# Patient Record
Sex: Female | Born: 1945 | Race: White | Hispanic: No | Smoking: Former smoker
Health system: Southern US, Community
[De-identification: ages and names within clinical notes are randomized; demographics above are authoritative.]

## PROBLEM LIST (undated history)

## (undated) DIAGNOSIS — J309 Allergic rhinitis, unspecified: Secondary | ICD-10-CM

## (undated) DIAGNOSIS — E785 Hyperlipidemia, unspecified: Secondary | ICD-10-CM

## (undated) DIAGNOSIS — K219 Gastro-esophageal reflux disease without esophagitis: Secondary | ICD-10-CM

## (undated) DIAGNOSIS — C3490 Malignant neoplasm of unspecified part of unspecified bronchus or lung: Secondary | ICD-10-CM

## (undated) DIAGNOSIS — M79609 Pain in unspecified limb: Secondary | ICD-10-CM

## (undated) DIAGNOSIS — K8020 Calculus of gallbladder without cholecystitis without obstruction: Secondary | ICD-10-CM

## (undated) DIAGNOSIS — E079 Disorder of thyroid, unspecified: Secondary | ICD-10-CM

## (undated) DIAGNOSIS — R51 Headache: Secondary | ICD-10-CM

## (undated) DIAGNOSIS — F419 Anxiety disorder, unspecified: Secondary | ICD-10-CM

## (undated) DIAGNOSIS — R1013 Epigastric pain: Secondary | ICD-10-CM

## (undated) DIAGNOSIS — R21 Rash and other nonspecific skin eruption: Secondary | ICD-10-CM

## (undated) DIAGNOSIS — J449 Chronic obstructive pulmonary disease, unspecified: Secondary | ICD-10-CM

## (undated) DIAGNOSIS — M5412 Radiculopathy, cervical region: Secondary | ICD-10-CM

## (undated) DIAGNOSIS — E041 Nontoxic single thyroid nodule: Secondary | ICD-10-CM

## (undated) DIAGNOSIS — F411 Generalized anxiety disorder: Secondary | ICD-10-CM

## (undated) HISTORY — DX: Hyperlipidemia, unspecified: E78.5

## (undated) HISTORY — DX: Anxiety disorder, unspecified: F41.9

## (undated) HISTORY — DX: Generalized anxiety disorder: F41.1

## (undated) HISTORY — DX: Disorder of thyroid, unspecified: E07.9

## (undated) HISTORY — DX: Pain in unspecified limb: M79.609

## (undated) HISTORY — DX: Radiculopathy, cervical region: M54.12

## (undated) HISTORY — DX: Gastro-esophageal reflux disease without esophagitis: K21.9

## (undated) HISTORY — DX: Calculus of gallbladder without cholecystitis without obstruction: K80.20

## (undated) HISTORY — DX: Rash and other nonspecific skin eruption: R21

## (undated) HISTORY — DX: Headache: R51

## (undated) HISTORY — DX: Nontoxic single thyroid nodule: E04.1

## (undated) HISTORY — DX: Allergic rhinitis, unspecified: J30.9

## (undated) HISTORY — DX: Chronic obstructive pulmonary disease, unspecified: J44.9

## (undated) HISTORY — DX: Epigastric pain: R10.13

---

## 1973-09-06 HISTORY — PX: ANAL FISSURE REPAIR: SHX2312

## 1976-09-06 DIAGNOSIS — E041 Nontoxic single thyroid nodule: Secondary | ICD-10-CM

## 1976-09-06 HISTORY — DX: Nontoxic single thyroid nodule: E04.1

## 1976-09-06 HISTORY — PX: TUMOR REMOVAL: SHX12

## 1997-09-06 ENCOUNTER — Encounter: Payer: Self-pay | Admitting: Internal Medicine

## 1997-09-06 LAB — CONVERTED CEMR LAB

## 1997-09-06 LAB — HM MAMMOGRAPHY

## 1999-09-07 HISTORY — PX: OTHER SURGICAL HISTORY: SHX169

## 2000-01-07 ENCOUNTER — Encounter: Payer: Self-pay | Admitting: Internal Medicine

## 2000-01-08 ENCOUNTER — Inpatient Hospital Stay (HOSPITAL_COMMUNITY): Admission: EM | Admit: 2000-01-08 | Discharge: 2000-01-10 | Payer: Self-pay | Admitting: Internal Medicine

## 2000-01-08 ENCOUNTER — Encounter: Payer: Self-pay | Admitting: Orthopedic Surgery

## 2000-02-10 ENCOUNTER — Ambulatory Visit (HOSPITAL_COMMUNITY): Admission: RE | Admit: 2000-02-10 | Discharge: 2000-02-10 | Payer: Self-pay | Admitting: Orthopedic Surgery

## 2000-06-17 ENCOUNTER — Encounter: Payer: Self-pay | Admitting: Family Medicine

## 2000-06-17 ENCOUNTER — Encounter: Admission: RE | Admit: 2000-06-17 | Discharge: 2000-06-17 | Payer: Self-pay | Admitting: Family Medicine

## 2001-02-25 ENCOUNTER — Encounter: Payer: Self-pay | Admitting: Emergency Medicine

## 2001-02-25 ENCOUNTER — Inpatient Hospital Stay (HOSPITAL_COMMUNITY): Admission: EM | Admit: 2001-02-25 | Discharge: 2001-02-26 | Payer: Self-pay | Admitting: Emergency Medicine

## 2001-02-26 ENCOUNTER — Encounter: Payer: Self-pay | Admitting: Emergency Medicine

## 2007-04-18 ENCOUNTER — Ambulatory Visit: Payer: Self-pay | Admitting: Internal Medicine

## 2007-04-21 ENCOUNTER — Ambulatory Visit: Payer: Self-pay | Admitting: Internal Medicine

## 2007-04-21 ENCOUNTER — Encounter: Payer: Self-pay | Admitting: Internal Medicine

## 2007-04-21 DIAGNOSIS — E079 Disorder of thyroid, unspecified: Secondary | ICD-10-CM

## 2007-04-21 DIAGNOSIS — R519 Headache, unspecified: Secondary | ICD-10-CM | POA: Insufficient documentation

## 2007-04-21 DIAGNOSIS — R51 Headache: Secondary | ICD-10-CM

## 2007-04-21 HISTORY — DX: Disorder of thyroid, unspecified: E07.9

## 2007-04-21 HISTORY — DX: Headache: R51

## 2007-04-21 LAB — CONVERTED CEMR LAB
ALT: 18 units/L (ref 0–35)
AST: 18 units/L (ref 0–37)
Alkaline Phosphatase: 67 units/L (ref 39–117)
Basophils Absolute: 0 10*3/uL (ref 0.0–0.1)
Basophils Relative: 0.6 % (ref 0.0–1.0)
Bilirubin Urine: NEGATIVE
Bilirubin, Direct: 0.1 mg/dL (ref 0.0–0.3)
CO2: 33 meq/L — ABNORMAL HIGH (ref 19–32)
Chloride: 105 meq/L (ref 96–112)
Creatinine, Ser: 0.7 mg/dL (ref 0.4–1.2)
Direct LDL: 119.8 mg/dL
Eosinophils Absolute: 0.1 10*3/uL (ref 0.0–0.6)
Eosinophils Relative: 1.2 % (ref 0.0–5.0)
GFR calc Af Amer: 109 mL/min
HCT: 43.3 % (ref 36.0–46.0)
HDL: 80.9 mg/dL (ref >39.0)
Ketones, ur: NEGATIVE mg/dL
Leukocytes, UA: NEGATIVE
Lymphocytes Relative: 26.5 % (ref 12.0–46.0)
MCV: 96.1 fL (ref 78.0–100.0)
Monocytes Relative: 6.8 % (ref 3.0–11.0)
Neutro Abs: 3.7 10*3/uL (ref 1.4–7.7)
Neutrophils Relative %: 64.9 % (ref 43.0–77.0)
Nitrite: NEGATIVE
Potassium: 4.4 meq/L (ref 3.5–5.1)
RDW: 11.6 % (ref 11.5–14.6)
TSH: 0.76 microintl units/mL (ref 0.35–5.50)
Total Bilirubin: 0.9 mg/dL (ref 0.3–1.2)
Total CHOL/HDL Ratio: 2.6
Total Protein, Urine: NEGATIVE mg/dL
Total Protein: 6.6 g/dL (ref 6.0–8.3)
VLDL: 16 mg/dL (ref 0–40)

## 2007-04-22 ENCOUNTER — Encounter: Payer: Self-pay | Admitting: Internal Medicine

## 2007-05-24 ENCOUNTER — Ambulatory Visit: Payer: Self-pay | Admitting: Gastroenterology

## 2007-06-26 ENCOUNTER — Encounter: Payer: Self-pay | Admitting: Internal Medicine

## 2007-06-26 ENCOUNTER — Ambulatory Visit: Payer: Self-pay | Admitting: Internal Medicine

## 2007-06-26 DIAGNOSIS — R21 Rash and other nonspecific skin eruption: Secondary | ICD-10-CM

## 2007-06-26 HISTORY — DX: Rash and other nonspecific skin eruption: R21

## 2007-06-27 ENCOUNTER — Encounter: Payer: Self-pay | Admitting: Internal Medicine

## 2008-05-07 HISTORY — PX: KNEE SURGERY: SHX244

## 2008-10-21 ENCOUNTER — Ambulatory Visit: Payer: Self-pay | Admitting: Internal Medicine

## 2008-10-21 DIAGNOSIS — F411 Generalized anxiety disorder: Secondary | ICD-10-CM

## 2008-10-21 DIAGNOSIS — F32A Depression, unspecified: Secondary | ICD-10-CM | POA: Insufficient documentation

## 2008-10-21 DIAGNOSIS — E785 Hyperlipidemia, unspecified: Secondary | ICD-10-CM

## 2008-10-21 DIAGNOSIS — F419 Anxiety disorder, unspecified: Secondary | ICD-10-CM

## 2008-10-21 DIAGNOSIS — F329 Major depressive disorder, single episode, unspecified: Secondary | ICD-10-CM

## 2008-10-21 HISTORY — DX: Generalized anxiety disorder: F41.1

## 2008-10-21 HISTORY — DX: Hyperlipidemia, unspecified: E78.5

## 2008-10-25 ENCOUNTER — Encounter: Payer: Self-pay | Admitting: Internal Medicine

## 2008-10-26 LAB — CONVERTED CEMR LAB
ALT: 21 units/L (ref 0–35)
AST: 22 units/L (ref 0–37)
Albumin: 4.3 g/dL (ref 3.5–5.2)
Alkaline Phosphatase: 68 units/L (ref 39–117)
BUN: 14 mg/dL (ref 6–23)
Basophils Absolute: 0.1 10*3/uL (ref 0.0–0.1)
Bilirubin Urine: NEGATIVE
Bilirubin, Direct: 0.1 mg/dL (ref 0.0–0.3)
CO2: 30 meq/L (ref 19–32)
Chloride: 98 meq/L (ref 96–112)
Cholesterol: 252 mg/dL (ref 0–200)
Crystals: NEGATIVE
GFR calc Af Amer: 109 mL/min
HDL: 117.4 mg/dL (ref >39.0)
Potassium: 3.9 meq/L (ref 3.5–5.1)
RDW: 12 % (ref 11.5–14.6)
TSH: 0.8 microintl units/mL (ref 0.35–5.50)
Total Bilirubin: 0.7 mg/dL (ref 0.3–1.2)
Total Protein, Urine: NEGATIVE mg/dL
Total Protein: 7.2 g/dL (ref 6.0–8.3)
Triglycerides: 114 mg/dL (ref 0–149)
VLDL: 23 mg/dL (ref 0–40)
WBC, UA: NONE SEEN cells/hpf

## 2008-11-05 ENCOUNTER — Ambulatory Visit: Payer: Self-pay | Admitting: Internal Medicine

## 2008-11-19 ENCOUNTER — Ambulatory Visit: Payer: Self-pay | Admitting: Internal Medicine

## 2008-11-19 ENCOUNTER — Encounter: Payer: Self-pay | Admitting: Internal Medicine

## 2008-11-19 LAB — HM COLONOSCOPY

## 2008-11-27 ENCOUNTER — Encounter: Payer: Self-pay | Admitting: Internal Medicine

## 2009-02-06 ENCOUNTER — Ambulatory Visit: Payer: Self-pay | Admitting: Internal Medicine

## 2009-02-06 DIAGNOSIS — J019 Acute sinusitis, unspecified: Secondary | ICD-10-CM | POA: Insufficient documentation

## 2009-05-09 ENCOUNTER — Telehealth: Payer: Self-pay | Admitting: Internal Medicine

## 2009-08-14 ENCOUNTER — Ambulatory Visit: Payer: Self-pay | Admitting: Internal Medicine

## 2009-08-14 LAB — CONVERTED CEMR LAB
AST: 20 units/L (ref 0–37)
Bilirubin Urine: NEGATIVE
Bilirubin, Direct: 0.1 mg/dL (ref 0.0–0.3)
Direct LDL: 129.4 mg/dL
Eosinophils Absolute: 0.1 10*3/uL (ref 0.0–0.7)
Glucose, Bld: 89 mg/dL (ref 70–99)
Leukocytes, UA: NEGATIVE
Lymphocytes Relative: 28.3 % (ref 12.0–46.0)
MCHC: 33.4 g/dL (ref 30.0–36.0)
Monocytes Relative: 6.7 % (ref 3.0–12.0)
Neutrophils Relative %: 63 % (ref 43.0–77.0)
Nitrite: NEGATIVE
Potassium: 4.2 meq/L (ref 3.5–5.1)
RBC: 4.47 M/uL (ref 3.87–5.11)
RDW: 12 % (ref 11.5–14.6)
Sodium: 140 meq/L (ref 135–145)
Specific Gravity, Urine: 1.015 (ref 1.000–1.030)
Total Protein, Urine: NEGATIVE mg/dL
Total Protein: 6.9 g/dL (ref 6.0–8.3)
Triglycerides: 93 mg/dL (ref 0.0–149.0)
Urine Glucose: NEGATIVE mg/dL
pH: 5.5 (ref 5.0–8.0)

## 2009-08-20 ENCOUNTER — Ambulatory Visit: Payer: Self-pay | Admitting: Internal Medicine

## 2009-08-20 DIAGNOSIS — M79609 Pain in unspecified limb: Secondary | ICD-10-CM | POA: Insufficient documentation

## 2009-08-20 HISTORY — DX: Pain in unspecified limb: M79.609

## 2009-08-27 ENCOUNTER — Encounter: Payer: Self-pay | Admitting: Internal Medicine

## 2009-08-28 ENCOUNTER — Ambulatory Visit: Payer: Self-pay

## 2009-08-28 ENCOUNTER — Encounter: Payer: Self-pay | Admitting: Cardiovascular Disease

## 2009-12-01 ENCOUNTER — Telehealth: Payer: Self-pay | Admitting: Internal Medicine

## 2010-02-19 ENCOUNTER — Ambulatory Visit: Payer: Self-pay | Admitting: Internal Medicine

## 2010-02-19 DIAGNOSIS — M5412 Radiculopathy, cervical region: Secondary | ICD-10-CM

## 2010-02-19 DIAGNOSIS — R1013 Epigastric pain: Secondary | ICD-10-CM

## 2010-02-19 HISTORY — DX: Epigastric pain: R10.13

## 2010-02-19 HISTORY — DX: Radiculopathy, cervical region: M54.12

## 2010-02-26 ENCOUNTER — Encounter: Payer: Self-pay | Admitting: Internal Medicine

## 2010-02-26 ENCOUNTER — Ambulatory Visit (HOSPITAL_COMMUNITY): Admission: RE | Admit: 2010-02-26 | Discharge: 2010-02-26 | Payer: Self-pay | Admitting: Internal Medicine

## 2010-02-26 ENCOUNTER — Telehealth: Payer: Self-pay | Admitting: Internal Medicine

## 2010-03-01 DIAGNOSIS — J309 Allergic rhinitis, unspecified: Secondary | ICD-10-CM | POA: Insufficient documentation

## 2010-03-01 HISTORY — DX: Allergic rhinitis, unspecified: J30.9

## 2010-04-16 ENCOUNTER — Encounter: Payer: Self-pay | Admitting: Internal Medicine

## 2010-10-08 NOTE — Letter (Signed)
Summary: [facility]  [facility]   Imported By: Sherian Rein 04/21/2010 13:33:39  _____________________________________________________________________  External Attachment:    Type:   Image     Comment:   External Document

## 2010-10-08 NOTE — Progress Notes (Signed)
Summary: chantix  Phone Note Call from Patient Call back at Home Phone [phone]   Summary of Call: patient left message on triage requesting prescription for Chantix. Please advise. Initial call taken by: Lucious Groves,  December 01, 2009 2:03 PM  Follow-up for Phone Call        done hardcopy to LIM side B - dahlia  Follow-up by: Corwin Levins MD,  December 01, 2009 5:17 PM  Additional Follow-up for Phone Call Additional follow up Details #1::        patient aware. Additional Follow-up by: Lucious Groves,  December 02, 2009 9:54 AM    New/Updated Medications: CHANTIX STARTING MONTH PAK 0.5 MG X 11 & 1 MG X 42 TABS (VARENICLINE TARTRATE) use asd 1 by mouth once daily CHANTIX CONTINUING MONTH PAK 1 MG TABS (VARENICLINE TARTRATE) use asd 1 by mouth once daily Prescriptions: CHANTIX CONTINUING MONTH PAK 1 MG TABS (VARENICLINE TARTRATE) use asd 1 by mouth once daily  #1pk x 1   Entered and Authorized by:   Corwin Levins MD   Signed by:   Corwin Levins MD on 12/01/2009   Method used:   Electronically to        [facility]. [phone]* (retail)       [address]       [place], [place]  [address]       Ph: [phone]       Fax: [phone]   RxID:   [id number] CHANTIX STARTING MONTH PAK 0.5 MG X 11 & 1 MG X 42 TABS (VARENICLINE TARTRATE) use asd 1 by mouth once daily  #1pk x 0   Entered and Authorized by:   Corwin Levins MD   Signed by:   Corwin Levins MD on 12/01/2009   Method used:   Electronically to        [facility]. [phone]* (retail)       [address]       [place], [place]  [address]       Ph: [phone]       Fax: [phone]   RxID:   [id number]

## 2010-10-08 NOTE — Miscellaneous (Signed)
Summary: Orders Update  Clinical Lists Changes  Orders: Added new Referral order of Orthopedic Surgeon Referral (Ortho Surgeon) - Signed 

## 2010-10-08 NOTE — Progress Notes (Signed)
  Phone Note Call from Patient Call back at [facility] Phone [phone]   Summary of Call: Patient left message on triage that she would like a call with recommendations about what she should do in regards to her ear. The patient notes that she had to quit taking Prednisone b/c of the way it affected her, but did not give any more details. Initial call taken by: Lucious Groves,  February 26, 2010 3:24 PM  Follow-up for Phone Call        Pt states that she needed to d/c Pred because of reaction (lethargic and SOB). I advised pt that Rx was not to treat ear but was for nek pain. Pt then decided to wait until appt next week with Ortho  Follow-up by: Margaret Pyle, CMA,  February 26, 2010 4:22 PM
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
## 2010-11-12 ENCOUNTER — Ambulatory Visit (INDEPENDENT_AMBULATORY_CARE_PROVIDER_SITE_OTHER): Payer: BC Managed Care – PPO | Admitting: Internal Medicine

## 2010-11-12 ENCOUNTER — Encounter: Payer: Self-pay | Admitting: Internal Medicine

## 2010-11-12 DIAGNOSIS — F411 Generalized anxiety disorder: Secondary | ICD-10-CM

## 2010-11-12 DIAGNOSIS — E785 Hyperlipidemia, unspecified: Secondary | ICD-10-CM

## 2010-11-12 DIAGNOSIS — J019 Acute sinusitis, unspecified: Secondary | ICD-10-CM
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
## 2011-01-22 NOTE — Op Note (Signed)
[place]  Patient:    Natalie Smith, Natalie Smith                        MRN: [id number] Proc. Date: 01/07/00 Adm. Date:  [id number] Disc. Date: [id number] Attending:  Wende Mott                           Operative Report  PREOPERATIVE DIAGNOSIS:  Fracture dislocation of left ankle trimalleolus.  POSTOPERATIVE DIAGNOSIS:   Fracture dislocation of left ankle trimalleolus.  OPERATION:  Open reduction and internal fixation of trimalleolar fracture of the left ankle and closed reduction, left ankle.  SURGEON:  Kennieth Rad, M.D.  ANESTHESIA:  General  DESCRIPTION OF PROCEDURE:  The patient was taken to the operating room after having been given adequate preop medication.  The patient was given general anesthesia and was intubated.  The left foot and lower leg were prepped with DuraPrep and draped in a sterile manner.  A tourniquet and Bovie were used for hemostasis.  C-arm was used to visualize fracture reduction.  Manipulative reduction of the ankle joint was done initially to reduce the ankle joint. Next, lateral incision was made over the lateral malleolus, going through the skin and subcutaneous tissue.  Soft tissue was subperiostea elevated from the fracture site.  The fracture itself was reduced and stabilized with a seven-hole compression plate with the use of six screws across the fracture site.  With the tibia itself reduced, next an anterior incision was made over the ankle joint, going through the skin and subcutaneous tissue.  Blunt dissection was made all the way down to the tibia itself.  A cannulated screw was used to stabilize the tibial fracture.  Guidewire was placed across the fracture site while it was held in anatomic position.  The drill bit was then placed over the guidewire followed by placement of a 38 mm screw.  Again, visualization was possible with the use of C-arm in AP, lateral, and oblique views with anatomic  reduction of both fractures.  Copious irrigation was then done with antibiotic solution followed by wound closure using 2-0 Vicryl for the subcutaneous tissue and skin staples for the skin.  Compressive dressing was then applied and a fiberglass short leg cast.  The patient tolerated the procedure quite well and went to the recovery room in stable and satisfactory condition. DD:  01/08/00 TD:  01/11/00 Job: [id number]

## 2011-01-22 NOTE — H&P (Signed)
[facility]  Patient:    Natalie Smith, Natalie Smith                        MRN: [id number] Adm. Date:  [id number] Disc. Date: [id number] Attending:  Lonell Face                         History and Physical  DATE OF BIRTH:  1946/03/15.  CHIEF COMPLAINT:  Severe chest pain today.  HISTORY OF PRESENT ILLNESS:  A 65 year old white female who was in her usual health and was asymptomatic until she developed severe, "intense," "sharp, solid chest pain," that felt like indigestion at first and radiated to the anterior neck bilaterally.  The intensity was 10 out of a possible 10 being the greatest.  This pain had acute onset at 5:30 p.m. today at rest just after drinking beer and eating spaghetti and smoking a marijuana cigarette.  The chest pain was pleuritic at times.  She tried two Tums without any relief. She took one aspirin without relief.  Her daughter took her to [facility] ER, where nitroglycerin sublingually x 3 each significantly and quickly decreased the pain to a level of 4 out of a possible 10.  Now her pain level is 2 out of a possible 10 after IV nitroglycerin was started.  She had no nausea until after the nitroglycerin.  No vomiting.  Denies fever, chills, sweats, syncope, or palpitations.  She complains of having shortness of breath associated with the chest pain, which is improved now.  REVIEW OF SYSTEMS:  Denies URI symptoms or any excessive cough or coryza or sore throat.  No change in vision.  No dysphagia.  Denies any change in bowel habits, melena, or bright red blood per rectum.  Denies any GU symptoms.  FAMILY HISTORY:  Mother diagnosed with heart disease in her "late 86s." Mother had COPD.  Mother died of heart disease, age 48.  Father died of Parkinsons, age 29.  PAST MEDICAL HISTORY:  Negative for any known heart disease or hypertension. No history of GI disease or ulcers or lung disease, diabetes, or seizure disorder.  She does  have a history of depression that has been improved recently after starting the Celexa 20 mg daily, five weeks ago.  MEDICATIONS:  No medications other than the Celexa 20 mg daily.  ALLERGIES:  CODEINE causes nausea.  PERCODAN causes hives.  PAST SURGICAL HISTORY:  Thyroidectomy for "thyroid tumor" many years ago.  Had right ankle surgery May 2001.  She states that thyroid blood tests have been normal over the years.  HEALTH MAINTENANCE:  Her last complete wellness/female exam was by Dr. Clyde Canterbury, her PCP at [facility] at [place], approximately one year ago.  SOCIAL HISTORY:  Widowed x 10 years.  Smokes 1-1/2 to two packs per day. Drinks alcohol on the weekends.  She drank five beers today, which she states was typical for a weekend day.  She occasionally smokes marijuana.  Denies any history of IV drugs.  She has one adult daughter.  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  PHYSICAL EXAMINATION:  VITAL SIGNS:  BP 135/89, pulse 89 and regular.  Respirations 20.  Temperature 99.2.  Pulse oximetry 95% on room air.  GENERAL:  Physical exam on admission showed an alert, cooperative 65 year old white female, who is mildly uncomfortable secondary to chest pain.  HEENT:  Head normocephalic, atraumatic.  Ears clear, normal TMS.  Nose and pharynx clear.  Upper and lower dentures.  NECK:  Supple, nontender.  Horizontal thyroidectomy scar intact without masses or thyromegaly or lymphadenopathy.  Carotids 2+ and equal without bruits.  CHEST:  Mild anterior chest wall tenderness.  Lungs:  Few diffuse rhonchi, otherwise clear.  Breast sounds equal.  CARDIAC:  Regular rate and rhythm without murmur, gallop, or rub.  BREASTS/PELVIC:  Deferred to Dr. Clyde Canterbury, her PCP, who will be performing this at her next wellness exam.  ABDOMEN:  Soft, nontender, without masses.  RECTAL:  No masses or tenderness.  There were a few external,  noninflamed hemorrhoids.  There was brown, heme-negative stool on glove.  EXTREMITIES:  Without cyanosis, clubbing, or edema.  NEUROLOGIC:  Nonfocal.  LABORATORY DATA:  EKG showed normal sinus rhythm with nonspecific T-wave abnormality.  Chest x-ray read by radiologist as within normal limits.  CBC and BMET within normal limits.  CK-MB and troponin were negative, drawn approximately 10 p.m. today.  IMPRESSION:  A 65 year old white female with: 1. Atypical chest pain.  This most likely is esophageal spasm.  However, she    has significant risk factors, and the pain was intense and was relieved by    nitroglycerin.  Will need to rule out MI.  Also need to rule out PE. 2. Smoker.  History of marijuana use.  History of alcohol use.  We discussed    abstinence as an important measure to help prevent disease. 3. Status post thyroidectomy.  PLANS: 1. CT of chest to rule out PE. 2. Admit to 23-hour observation, telemetry bed, to rule out MI. 3. Serial CK-MB and troponin levels and serial EKG. 4. Check TSH. 5. Will treat with an H2 blocker to see if that helps esophageal symptoms. DD:  02/26/01 TD:  02/27/01 Job: [id number]

## 2011-01-22 NOTE — Discharge Summary (Signed)
[place]  Patient:    Natalie Smith, Natalie Smith                        MRN: [id number] Adm. Date:  01/08/00 Disc. Date: 01/10/00 Attending:  Kennieth Rad, M.D.                           Discharge Summary  ADMITTING DIAGNOSES: 1. Trimalleolar fracture dislocation left ankle. 2. Fracture base of 5th metatarsal right foot. 3. Contused body.  DISCHARGE DIAGNOSES: 1. Trimalleolar fracture dislocation left ankle. 2. Fracture base of 5th metatarsal right foot. 3. Contused body.  COMPLICATIONS:  None.  INFECTIONS:  None.  OPERATION:  Open reduction and internal fixation right ankle on Jan 08, 2000.  PERTINENT HISTORY:  This is a 65 year old female who had slipped and fallen when she had some planters and twisted her right foot and her left ankle.  The patient was brought to [facility] emergency room for treatment.  Pertinent physical was that of the left ankle gross deformity anterior dislocation, some hypoesthesia about the toes.  Pulses were intact, abrasion medially.  The right foot hematoma over the base of the 5th metatarsal tendon swelling.  X-rays revealed trimalleolar fracture dislocation of the left ankle and fracture of the base of the right foot.  HOSPITAL COURSE:  The patient underwent open reduction and internal fixation of the left ankle on Jan 08, 2000.  She tolerated the procedure quite well.  Postoperative course:  The patient had to have the cast split due to swelling.  Ice packs, elevation, pain control, and started on physical therapy, nonweightbearing on the left side but also with difficulty because of a fracture on the right foot.  The patient was fit with a wheelchair, 3 in 1 commode, and was able to be discharged on Celebrex, Ambien and Percocet.  Ice packs, elevation at home and to return to the office in a 10 day period.  The patient was discharged in stable and satisfactory condition. DD:  01/29/00 TD:  02/02/00 Job:  [id number]

## 2011-01-22 NOTE — Op Note (Signed)
[place]. [facility]  Patient:    Natalie Smith, Natalie Smith                        MRN: [id number] Proc. Date: 01/07/00 Adm. Date:  01/07/00 Attending:  Kennieth Rad, M.D.                           Operative Report  PREOPERATIVE DIAGNOSIS:  Trimalleolar fracture dislocation left ankle.  POSTOPERATIVE DIAGNOSIS:  Trimalleolar fracture dislocation left ankle.  ANESTHESIA:  General.  PROCEDURE:  Open reduction and internal fixation fracture dislocation trimalleolar fracture left ankle.  DESCRIPTION OF PROCEDURE:  The patient was taken to the operating room after given adequate prior indication and general anesthesia and intubated.  The left ankle and lower leg was prepped with Duraprep and draped in a sterile manner.  Tourniquet and Bovie were used for hemostasis.  The C-arm was used to visualize the fracture reduction.  The reduction of the ankle itself was initially done bringing the ankle in a reduced position from its dislocation.  Next, a lateral incision was made over the left ankle going to the skin and subcutaneous tissue down to the fracture site of the lateral malleolus.  With manipulated reduction and use of bone clamps anatomic reduction was possible. A 7 hole compression plate was put in place and 6 screws placed across the fracture site.  Next, an anterior incision made over the ankle going to the skin and subcutaneous tissue and blunt dissection with use of hemostat was done down to the tibia.  There was an anterior and posterior plafond fracture and with a cannulated screw placed across the fracture site again, anatomic reduction was possible.  Copious irrigation with antibiotic solution was done followed by use of 2-0 Vicryl for the subcutaneous tissue and skin staples for the skin. Compressive dressing was applied, and a short leg cast was applied.  The patient tolerated the procedure quite well and went to the recovery room in stable  satisfactory condition. DD:  01/29/00 TD:  02/02/00 Job: [id number]

## 2011-01-22 NOTE — H&P (Signed)
[place]. [facility]  Patient:    Natalie Smith, Natalie Smith                        MRN: [id number] Adm. Date:  [id number] Attending:  Wende Mott                         History and Physical  CHIEF COMPLAINT:  Painful deformed right ankle.  HISTORY OF PRESENT ILLNESS:  This is a 65 year old female who was working on planting some plants about a planter and had stepped over the planter, lost her  footing, twisted her right ankle and landed onto her right side.  Patient also states that she came down onto her buttocks and had some pain in her buttocks.  Patient subsequently was brought to [facility] Emergency Room.  Patient complained of right ankle deformity, left foot pain, as well as pain about the buttocks. Patient denies any loss of consciousness or any head trauma.  PAST MEDICAL HISTORY:  Treatment for depression and surgical resection of thyroid nodule.  No history of high blood pressure or diabetes.  ALLERGIES:  CODEINE causes her to become nauseated.  MEDICATIONS:  Antidepressant.  FAMILY HISTORY:  Stroke and cancer in the mother and Parkinsons disease of the father.  REVIEW OF SYSTEMS:  Basically, patient has been in good health.  No cardiac or respiratory symptoms.  No urinary or bowel symptoms.  PHYSICAL EXAMINATION:  VITAL SIGNS:  Blood pressure 123/76, pulse 81, respirations 21, temperature 98.3.  HEENT:  Normocephalic.  Eyes:  Conjunctivae and sclerae clear.  NECK:  Old surgical scar, transverse base of the neck, anteriorly.  CHEST:  Clear.  CARDIAC:  S1 and S2, regular.  EXTREMITIES:  Left ankle grossly deformed with anterior dislocation and some hypoesthesia about the toes.  Dorsalis pedis is palpable.  Small abrasion medially. Right foot hematoma over the base of the fifth metatarsal, tender.  X-RAY FINDINGS:  X-ray reveals fracture dislocation, trimalleolar, of the left ankle and fracture of the base of the fifth  metatarsal, right foot, non-displaced. Pelvic x-rays negative.  Chest x-ray was normal.  IMPRESSION: 1. Fracture dislocation of the left ankle, trimalleolar. 2. Fracture of the base of the fifth metatarsal, right foot. 3. Contused buttocks. DD:  01/08/00 TD:  01/08/00 Job: [id number]

## 2011-02-04 ENCOUNTER — Other Ambulatory Visit (INDEPENDENT_AMBULATORY_CARE_PROVIDER_SITE_OTHER): Payer: Medicare Other

## 2011-02-04 ENCOUNTER — Other Ambulatory Visit: Payer: Self-pay | Admitting: Internal Medicine

## 2011-02-04 ENCOUNTER — Other Ambulatory Visit (INDEPENDENT_AMBULATORY_CARE_PROVIDER_SITE_OTHER): Payer: Medicare Other | Admitting: Internal Medicine

## 2011-02-04 DIAGNOSIS — Z0000 Encounter for general adult medical examination without abnormal findings: Secondary | ICD-10-CM

## 2011-02-04 DIAGNOSIS — E785 Hyperlipidemia, unspecified: Secondary | ICD-10-CM

## 2011-02-04 LAB — HEPATIC FUNCTION PANEL
ALT: 19 U/L (ref 0–35)
Albumin: 3.8 g/dL (ref 3.5–5.2)
Bilirubin, Direct: 0.1 mg/dL (ref 0.0–0.3)

## 2011-02-04 LAB — CBC WITH DIFFERENTIAL/PLATELET
Basophils Absolute: 0 10*3/uL (ref 0.0–0.1)
Basophils Relative: 0.4 % (ref 0.0–3.0)
Eosinophils Absolute: 0.1 10*3/uL (ref 0.0–0.7)
HCT: 45.9 % (ref 36.0–46.0)
Lymphs Abs: 1.4 10*3/uL (ref 0.7–4.0)
MCHC: 33.9 g/dL (ref 30.0–36.0)
Neutro Abs: 3.1 10*3/uL (ref 1.4–7.7)
Neutrophils Relative %: 62.8 % (ref 43.0–77.0)

## 2011-02-04 LAB — URINALYSIS, ROUTINE W REFLEX MICROSCOPIC
Bilirubin Urine: NEGATIVE
Urine Glucose: NEGATIVE
Urobilinogen, UA: 0.2 (ref 0.0–1.0)

## 2011-02-04 LAB — LIPID PANEL
Cholesterol: 204 mg/dL — ABNORMAL HIGH (ref 0–200)
Triglycerides: 57 mg/dL (ref 0.0–149.0)

## 2011-02-04 LAB — BASIC METABOLIC PANEL
CO2: 32 mEq/L (ref 19–32)
Calcium: 9.1 mg/dL (ref 8.4–10.5)
Chloride: 100 mEq/L (ref 96–112)
Glucose, Bld: 77 mg/dL (ref 70–99)
Sodium: 139 mEq/L (ref 135–145)

## 2011-02-04 LAB — LDL CHOLESTEROL, DIRECT: Direct LDL: 113.3 mg/dL

## 2011-02-07 ENCOUNTER — Encounter: Payer: Self-pay | Admitting: Internal Medicine

## 2011-02-07 DIAGNOSIS — Z0000 Encounter for general adult medical examination without abnormal findings: Secondary | ICD-10-CM | POA: Insufficient documentation

## 2011-02-09 ENCOUNTER — Encounter: Payer: Self-pay | Admitting: Internal Medicine

## 2011-02-11 ENCOUNTER — Encounter: Payer: Self-pay | Admitting: Internal Medicine

## 2011-02-11 ENCOUNTER — Ambulatory Visit (INDEPENDENT_AMBULATORY_CARE_PROVIDER_SITE_OTHER): Payer: Medicare Other | Admitting: Internal Medicine

## 2011-02-11 VITALS — BP 122/72 | HR 78 | Temp 97.6°F | Ht 63.0 in | Wt 147.0 lb

## 2011-02-11 DIAGNOSIS — Z23 Encounter for immunization: Secondary | ICD-10-CM

## 2011-02-11 DIAGNOSIS — Z0000 Encounter for general adult medical examination without abnormal findings: Secondary | ICD-10-CM

## 2011-02-11 MED ORDER — PNEUMOCOCCAL VAC POLYVALENT 25 MCG/0.5ML IJ INJ
0.5000 mL | INJECTION | Freq: Once | INTRAMUSCULAR | Status: AC
  Administered 2011-02-11: 0.5 mL via INTRAMUSCULAR

## 2011-02-11 MED ORDER — ASPIRIN EC 81 MG PO TBEC: 81.0000 mg | DELAYED_RELEASE_TABLET | Freq: Every day | ORAL | Status: AC

## 2011-02-11 NOTE — Patient Instructions (Signed)
Please start the Aspirin 81 mg - 1 per day - COATED only Continue all other medications as before You can also take  Mucinex (or it's generic off brand) for congestion and ear fullness Your EKG was ok today You had the pneumonia shot today Please return in 1 year for your yearly visit, or sooner if needed, with Lab testing done 3-5 days before

## 2011-02-12 ENCOUNTER — Encounter: Payer: Self-pay | Admitting: Internal Medicine

## 2011-02-12 NOTE — Assessment & Plan Note (Signed)
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
## 2011-03-25 ENCOUNTER — Telehealth: Payer: Self-pay | Admitting: *Deleted

## 2011-03-25 NOTE — Telephone Encounter (Signed)
Would need OV since this is a controlled substance, and not an ongoing chronic med

## 2011-03-25 NOTE — Telephone Encounter (Signed)
Requesting Vicodin Rx for neck/back pain that has flared up since starting visits w/chiropractor [do not prescribe pain medication]. Pt states that she has been given this Rx previously Maurice Small notation is removal from med list on 10/21/2008 [employer]]

## 2011-03-26 NOTE — Telephone Encounter (Signed)
Notified pt with md response. Pt states will call back for appt...03/26/11@3 :06pm/LMB

## 2011-06-23 ENCOUNTER — Encounter: Payer: Self-pay | Admitting: Internal Medicine

## 2011-06-24 ENCOUNTER — Ambulatory Visit (INDEPENDENT_AMBULATORY_CARE_PROVIDER_SITE_OTHER): Payer: Medicare Other | Admitting: Internal Medicine

## 2011-06-24 ENCOUNTER — Ambulatory Visit (INDEPENDENT_AMBULATORY_CARE_PROVIDER_SITE_OTHER)
Admission: RE | Admit: 2011-06-24 | Discharge: 2011-06-24 | Disposition: A | Discharge status: Home / Self Care | Payer: Medicare Other | Source: Ambulatory Visit | Attending: Internal Medicine | Admitting: Internal Medicine

## 2011-06-24 ENCOUNTER — Other Ambulatory Visit (INDEPENDENT_AMBULATORY_CARE_PROVIDER_SITE_OTHER): Payer: Medicare Other

## 2011-06-24 ENCOUNTER — Encounter: Payer: Self-pay | Admitting: Internal Medicine

## 2011-06-24 DIAGNOSIS — R079 Chest pain, unspecified: Secondary | ICD-10-CM

## 2011-06-24 DIAGNOSIS — R1013 Epigastric pain: Secondary | ICD-10-CM

## 2011-06-24 DIAGNOSIS — R1012 Left upper quadrant pain: Secondary | ICD-10-CM | POA: Insufficient documentation

## 2011-06-24 DIAGNOSIS — F411 Generalized anxiety disorder: Secondary | ICD-10-CM

## 2011-06-24 LAB — CBC WITH DIFFERENTIAL/PLATELET
HCT: 47.5 % — ABNORMAL HIGH (ref 36.0–46.0)
Lymphocytes Relative: 22.2 % (ref 12.0–46.0)
Lymphs Abs: 1.4 10*3/uL (ref 0.7–4.0)
MCHC: 33.8 g/dL (ref 30.0–36.0)
Monocytes Absolute: 0.4 10*3/uL (ref 0.1–1.0)
Neutrophils Relative %: 70.3 % (ref 43.0–77.0)
RBC: 4.73 Mil/uL (ref 3.87–5.11)
WBC: 6.2 10*3/uL (ref 4.5–10.5)

## 2011-06-24 LAB — H. PYLORI ANTIBODY, IGG: H Pylori IgG: NEGATIVE

## 2011-06-24 MED ORDER — PANTOPRAZOLE SODIUM 40 MG PO TBEC: 40.0000 mg | DELAYED_RELEASE_TABLET | Freq: Every day | ORAL | Status: DC

## 2011-06-24 MED ORDER — ALPRAZOLAM 1 MG PO TABS: 1.0000 mg | ORAL_TABLET | Freq: Two times a day (BID) | ORAL | Status: DC | PRN
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
## 2011-06-24 NOTE — Patient Instructions (Signed)
Take all new medications as prescribed Continue all other medications as before Please go to XRAY in the Basement for the x-ray test Please go to LAB in the Basement for the blood and/or urine tests to be done today Please call the phone number [phone] (the [employer]Tree System) for results of testing in 2-3 days;  When calling, simply dial the number, and when prompted enter the MRN number above (the Medical Record Number) and the # key, then the message should start. You will be contacted regarding the referral for: ultrasound

## 2011-06-27 ENCOUNTER — Encounter: Payer: Self-pay | Admitting: Internal Medicine

## 2011-06-27 NOTE — Assessment & Plan Note (Signed)
?   MSK - for cxr/films to r/o underlying pulm/msk , . to f/u any worsening symptoms or concerns

## 2011-06-27 NOTE — Assessment & Plan Note (Signed)
Etiology unclear, for h pylori, PPI,  to f/u any worsening symptoms or concerns

## 2011-06-27 NOTE — Assessment & Plan Note (Signed)
stable overall by hx and exam, most recent data reviewed with pt, and pt to continue medical treatment as before - for med refill Lab Results  Component Value Date   WBC 6.2 06/24/2011   HGB 16.1* 06/24/2011   HCT 47.5* 06/24/2011   PLT 207.0 06/24/2011   GLUCOSE 77 02/04/2011   CHOL 204* 02/04/2011   TRIG 57.0 02/04/2011   HDL 95.20 02/04/2011   LDLDIRECT 113.3 02/04/2011   ALT 19 02/04/2011   AST 19 02/04/2011   NA 139 02/04/2011   K 4.3 02/04/2011   CL 100 02/04/2011   CREATININE 0.6 02/04/2011   BUN 11 02/04/2011   CO2 32 02/04/2011   TSH 1.19 02/04/2011

## 2011-06-27 NOTE — Assessment & Plan Note (Signed)
Also for abd u/s with pt c/o LUQ mass,  to f/u any worsening symptoms or concerns

## 2011-07-22 ENCOUNTER — Encounter: Payer: Self-pay | Admitting: Internal Medicine

## 2011-07-22 ENCOUNTER — Ambulatory Visit
Admission: RE | Admit: 2011-07-22 | Discharge: 2011-07-22 | Disposition: A | Discharge status: Home / Self Care | Payer: Medicare Other | Source: Ambulatory Visit | Attending: Internal Medicine | Admitting: Internal Medicine

## 2011-07-22 DIAGNOSIS — R1012 Left upper quadrant pain: Secondary | ICD-10-CM

## 2011-07-22 DIAGNOSIS — K8020 Calculus of gallbladder without cholecystitis without obstruction: Secondary | ICD-10-CM

## 2011-07-22 HISTORY — DX: Calculus of gallbladder without cholecystitis without obstruction: K80.20

## 2011-11-22 ENCOUNTER — Ambulatory Visit (INDEPENDENT_AMBULATORY_CARE_PROVIDER_SITE_OTHER)
Admission: RE | Admit: 2011-11-22 | Discharge: 2011-11-22 | Disposition: A | Discharge status: Home / Self Care | Payer: Medicare Other | Source: Ambulatory Visit

## 2011-11-22 ENCOUNTER — Ambulatory Visit (INDEPENDENT_AMBULATORY_CARE_PROVIDER_SITE_OTHER): Payer: Medicare Other | Admitting: Internal Medicine

## 2011-11-22 ENCOUNTER — Encounter: Payer: Self-pay | Admitting: Internal Medicine

## 2011-11-22 VITALS — BP 140/70 | HR 78 | Temp 97.0°F | Ht 63.0 in | Wt 143.8 lb

## 2011-11-22 DIAGNOSIS — S22000A Wedge compression fracture of unspecified thoracic vertebra, initial encounter for closed fracture: Secondary | ICD-10-CM

## 2011-11-22 DIAGNOSIS — B349 Viral infection, unspecified: Secondary | ICD-10-CM | POA: Insufficient documentation

## 2011-11-22 DIAGNOSIS — B9789 Other viral agents as the cause of diseases classified elsewhere: Secondary | ICD-10-CM

## 2011-11-22 DIAGNOSIS — R0600 Dyspnea, unspecified: Secondary | ICD-10-CM

## 2011-11-22 DIAGNOSIS — S22009A Unspecified fracture of unspecified thoracic vertebra, initial encounter for closed fracture: Secondary | ICD-10-CM

## 2011-11-22 DIAGNOSIS — R0609 Other forms of dyspnea: Secondary | ICD-10-CM

## 2011-11-22 DIAGNOSIS — R0989 Other specified symptoms and signs involving the circulatory and respiratory systems: Secondary | ICD-10-CM

## 2011-11-22 DIAGNOSIS — R062 Wheezing: Secondary | ICD-10-CM

## 2011-11-22 DIAGNOSIS — R1013 Epigastric pain: Secondary | ICD-10-CM

## 2011-11-22 DIAGNOSIS — Z0000 Encounter for general adult medical examination without abnormal findings: Secondary | ICD-10-CM

## 2011-11-22 DIAGNOSIS — E559 Vitamin D deficiency, unspecified: Secondary | ICD-10-CM

## 2011-11-22 MED ORDER — PREDNISONE 10 MG PO TABS: ORAL_TABLET | ORAL | Status: DC

## 2011-11-22 NOTE — Assessment & Plan Note (Signed)
With recent resp insufficiency , o2 sat 90% now improved, still with some  Bronchospasm, to cont the inhaler, add short course predpack

## 2011-11-22 NOTE — Assessment & Plan Note (Signed)
chornic per CXR; but likely less than 10 yrs ago, has had several falls, for dxa

## 2011-11-22 NOTE — Patient Instructions (Addendum)
Please continue to stop smoking as you have Take all new medications as prescribed - the prednisone Continue all other medications as before, including the inhaler You will be contacted regarding the referral for: Lung testing (PFT's) Please schedule your bone density at the desk as you leave today Please return in 3 mo with Lab testing done 3-5 days before

## 2011-11-22 NOTE — Assessment & Plan Note (Signed)
Now states she has quit smoking due to this illness; suspect underlying copd - will refer for PFT

## 2011-11-28 ENCOUNTER — Encounter: Payer: Self-pay | Admitting: Internal Medicine
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
## 2011-11-28 NOTE — Assessment & Plan Note (Signed)
Unclear etiology, exam bengn, declines labs,  to f/u any worsening symptoms or concerns

## 2011-11-28 NOTE — Assessment & Plan Note (Signed)
Mild to mod, to cont tx as is, likely viral,  to f/u any worsening symptoms or concerns

## 2011-11-28 NOTE — Assessment & Plan Note (Signed)
Also for vit d level 

## 2011-12-06 ENCOUNTER — Ambulatory Visit (INDEPENDENT_AMBULATORY_CARE_PROVIDER_SITE_OTHER): Payer: Medicare Other | Admitting: Internal Medicine

## 2011-12-06 ENCOUNTER — Encounter: Payer: Self-pay | Admitting: Internal Medicine

## 2011-12-06 DIAGNOSIS — R0609 Other forms of dyspnea: Secondary | ICD-10-CM

## 2011-12-06 DIAGNOSIS — R0600 Dyspnea, unspecified: Secondary | ICD-10-CM

## 2011-12-06 LAB — PULMONARY FUNCTION TEST

## 2011-12-06 NOTE — Progress Notes (Signed)
PFT done today. 

## 2011-12-13 ENCOUNTER — Telehealth: Payer: Self-pay

## 2011-12-13 NOTE — Telephone Encounter (Signed)
Patient informed will let her know results as soon as available

## 2011-12-13 NOTE — Telephone Encounter (Signed)
unfort not yet available, this is usually due to an MD needing to do the interpretation and then posting the results to me, will try to let her know when they are available

## 2011-12-13 NOTE — Telephone Encounter (Signed)
Patient had pulmonary test last week and is requesting results. Call back number is [phone]

## 2011-12-20 ENCOUNTER — Telehealth: Payer: Self-pay | Admitting: Internal Medicine

## 2011-12-20 ENCOUNTER — Telehealth: Payer: Self-pay

## 2011-12-20 NOTE — Telephone Encounter (Signed)
Called Pulmonary informed of Dr. Raphael Gibney request. I was informed that a message would be given to the MD's nurse who would call and inform the patient of her results. Call back number for the patient is [phone]

## 2011-12-20 NOTE — Telephone Encounter (Signed)
Message copied by Pincus Sanes on Mon Dec 20, 2011  2:06 PM ------      Message from: Corwin Levins      Created: Mon Dec 20, 2011 12:47 PM      Regarding: pft interpretation       I see the PFT results on the chart for almost a wk now, but no included interpretation            Natalie Smith to contact PFT tech or person in charge of this - will the interpretation be forthcoming?  Pt was anxious to know results

## 2011-12-20 NOTE — Telephone Encounter (Signed)
I spoke with Natalie Smith and advised her she will have to call her pcp since they are the ones who ordered this. She stated she did not call us that she did call them and needed nothing from [place]

## 2011-12-21 ENCOUNTER — Telehealth: Payer: Self-pay

## 2011-12-21 ENCOUNTER — Telehealth: Payer: Self-pay | Admitting: Internal Medicine

## 2011-12-21 NOTE — Telephone Encounter (Signed)
Results with interpretation received;  PFT's c/w mod copd, but no real change with use of the inhalers;  Ok to cont the same treatment

## 2011-12-21 NOTE — Telephone Encounter (Signed)
[facility]

## 2011-12-21 NOTE — Telephone Encounter (Signed)
Called the patient back. She did receive a call from pulmonary yesterday  (12/20/11 read phone  Notes), but the nurse she spoke to informed she would have to call PCP to get results. Please advise as patient would like her results and did state she is still having some SOB.

## 2011-12-21 NOTE — Telephone Encounter (Signed)
Called pulmonary and spoke to [facility] gave MD's instructions for results.

## 2011-12-21 NOTE — Telephone Encounter (Signed)
Message copied by Pincus Sanes on Tue Dec 21, 2011 11:32 AM ------      Message from: Etheleen Sia      Created: Tue Dec 21, 2011 11:08 AM      Regarding: PFT       PT CALLED WANTING PFT RESULTS

## 2011-12-21 NOTE — Telephone Encounter (Signed)
If that is the case, please call Pulm and ask for the results WITH the interpretation, as I dont have the interpretation part of the results, which is normally included

## 2011-12-22 MED ORDER — ALBUTEROL SULFATE HFA 108 (90 BASE) MCG/ACT IN AERS: 2.0000 | INHALATION_SPRAY | Freq: Four times a day (QID) | RESPIRATORY_TRACT | Status: DC | PRN

## 2011-12-22 NOTE — Telephone Encounter (Signed)
Done per emr 

## 2011-12-22 NOTE — Telephone Encounter (Signed)
Patient informed. 

## 2011-12-22 NOTE — Telephone Encounter (Signed)
Called informed the patient of results. The only inhaler she has she got from [facility] (ventolin) and it is almost gone.  She would like a prescription please, states ventolin works ok for her if there is something more effective that would be good.  [facility].

## 2011-12-23 ENCOUNTER — Other Ambulatory Visit: Payer: Self-pay | Admitting: Internal Medicine

## 2011-12-23 NOTE — Telephone Encounter (Signed)
Done hardcopy to robin  

## 2011-12-23 NOTE — Telephone Encounter (Signed)
Faxed hardcopy to pharmacy. 

## 2012-02-14 ENCOUNTER — Other Ambulatory Visit (INDEPENDENT_AMBULATORY_CARE_PROVIDER_SITE_OTHER): Payer: Medicare Other

## 2012-02-14 DIAGNOSIS — Z79899 Other long term (current) drug therapy: Secondary | ICD-10-CM

## 2012-02-14 DIAGNOSIS — Z0000 Encounter for general adult medical examination without abnormal findings: Secondary | ICD-10-CM

## 2012-02-14 LAB — LIPID PANEL
HDL: 99.8 mg/dL (ref >39.00)
LDL Cholesterol: 80 mg/dL (ref 0–99)
Total CHOL/HDL Ratio: 2
VLDL: 7.6 mg/dL (ref 0.0–40.0)

## 2012-02-14 LAB — HEPATIC FUNCTION PANEL
Albumin: 4.2 g/dL (ref 3.5–5.2)
Alkaline Phosphatase: 55 U/L (ref 39–117)
Bilirubin, Direct: 0.1 mg/dL (ref 0.0–0.3)
Total Bilirubin: 0.6 mg/dL (ref 0.3–1.2)

## 2012-02-14 LAB — URINALYSIS, ROUTINE W REFLEX MICROSCOPIC
Bilirubin Urine: NEGATIVE
Nitrite: NEGATIVE
Total Protein, Urine: NEGATIVE
Urine Glucose: NEGATIVE
pH: 8.5 (ref 5.0–8.0)

## 2012-02-14 LAB — BASIC METABOLIC PANEL
Calcium: 9.1 mg/dL (ref 8.4–10.5)
GFR: 102.33 mL/min (ref >60.00)
Glucose, Bld: 86 mg/dL (ref 70–99)
Sodium: 137 mEq/L (ref 135–145)

## 2012-02-14 LAB — TSH: TSH: 0.8 u[IU]/mL (ref 0.35–5.50)

## 2012-02-14 LAB — CBC WITH DIFFERENTIAL/PLATELET
Basophils Absolute: 0 10*3/uL (ref 0.0–0.1)
Eosinophils Absolute: 0.1 10*3/uL (ref 0.0–0.7)
Lymphocytes Relative: 35.7 % (ref 12.0–46.0)
MCHC: 33 g/dL (ref 30.0–36.0)
Monocytes Relative: 7 % (ref 3.0–12.0)
Neutrophils Relative %: 55.5 % (ref 43.0–77.0)
RDW: 13.2 % (ref 11.5–14.6)

## 2012-02-21 ENCOUNTER — Ambulatory Visit (INDEPENDENT_AMBULATORY_CARE_PROVIDER_SITE_OTHER): Payer: Medicare Other | Admitting: Internal Medicine

## 2012-02-21 ENCOUNTER — Encounter: Payer: Self-pay | Admitting: Internal Medicine

## 2012-02-21 VITALS — BP 110/70 | HR 84 | Temp 98.4°F | Ht 63.0 in | Wt 148.0 lb

## 2012-02-21 DIAGNOSIS — Z0000 Encounter for general adult medical examination without abnormal findings: Secondary | ICD-10-CM

## 2012-02-21 NOTE — Patient Instructions (Addendum)
Please start Aspirin 81 mg per day - COATED only Please stop smoking Please continue your efforts at being more active, low cholesterol diet, and weight control, as you are doing You are otherwise up to date with prevention Please return in 1 year for your yearly visit, or sooner if needed, with Lab testing done 3-5 days before

## 2012-02-21 NOTE — Assessment & Plan Note (Signed)

## 2012-02-27 ENCOUNTER — Encounter: Payer: Self-pay | Admitting: Internal Medicine
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
## 2013-01-30 ENCOUNTER — Other Ambulatory Visit (INDEPENDENT_AMBULATORY_CARE_PROVIDER_SITE_OTHER): Payer: Medicare Other

## 2013-01-30 DIAGNOSIS — Z0000 Encounter for general adult medical examination without abnormal findings: Secondary | ICD-10-CM

## 2013-01-30 DIAGNOSIS — E785 Hyperlipidemia, unspecified: Secondary | ICD-10-CM

## 2013-01-30 LAB — BASIC METABOLIC PANEL
BUN: 5 mg/dL — ABNORMAL LOW (ref 6–23)
CO2: 30 mEq/L (ref 19–32)
Chloride: 98 mEq/L (ref 96–112)
Creatinine, Ser: 0.6 mg/dL (ref 0.4–1.2)

## 2013-01-30 LAB — HEPATIC FUNCTION PANEL
AST: 19 U/L (ref 0–37)
Alkaline Phosphatase: 55 U/L (ref 39–117)
Total Bilirubin: 0.9 mg/dL (ref 0.3–1.2)

## 2013-01-30 LAB — URINALYSIS, ROUTINE W REFLEX MICROSCOPIC
Ketones, ur: NEGATIVE
Leukocytes, UA: NEGATIVE
Specific Gravity, Urine: 1.015 (ref 1.000–1.030)
Urobilinogen, UA: 0.2 (ref 0.0–1.0)

## 2013-01-30 LAB — LIPID PANEL
Cholesterol: 224 mg/dL — ABNORMAL HIGH (ref 0–200)
Total CHOL/HDL Ratio: 2

## 2013-01-30 LAB — CBC WITH DIFFERENTIAL/PLATELET
Basophils Absolute: 0 10*3/uL (ref 0.0–0.1)
HCT: 46.1 % — ABNORMAL HIGH (ref 36.0–46.0)
Lymphs Abs: 1.4 10*3/uL (ref 0.7–4.0)
MCV: 97.5 fl (ref 78.0–100.0)
Monocytes Absolute: 0.3 10*3/uL (ref 0.1–1.0)
Platelets: 221 10*3/uL (ref 150.0–400.0)
RDW: 13.6 % (ref 11.5–14.6)

## 2013-01-30 LAB — TSH: TSH: 1.13 u[IU]/mL (ref 0.35–5.50)

## 2013-01-31 ENCOUNTER — Encounter: Payer: Self-pay | Admitting: Internal Medicine

## 2013-01-31 ENCOUNTER — Ambulatory Visit (INDEPENDENT_AMBULATORY_CARE_PROVIDER_SITE_OTHER): Payer: Medicare Other | Admitting: Internal Medicine

## 2013-01-31 VITALS — BP 120/80 | HR 78 | Temp 99.4°F | Ht 63.0 in | Wt 148.2 lb

## 2013-01-31 DIAGNOSIS — R109 Unspecified abdominal pain: Secondary | ICD-10-CM | POA: Insufficient documentation

## 2013-01-31 DIAGNOSIS — Z0000 Encounter for general adult medical examination without abnormal findings: Secondary | ICD-10-CM

## 2013-01-31 DIAGNOSIS — M899 Disorder of bone, unspecified: Secondary | ICD-10-CM

## 2013-01-31 DIAGNOSIS — M8580 Other specified disorders of bone density and structure, unspecified site: Secondary | ICD-10-CM | POA: Insufficient documentation

## 2013-01-31 DIAGNOSIS — F411 Generalized anxiety disorder: Secondary | ICD-10-CM

## 2013-01-31 DIAGNOSIS — F17200 Nicotine dependence, unspecified, uncomplicated: Secondary | ICD-10-CM

## 2013-01-31 MED ORDER — ALPRAZOLAM 1 MG PO TABS: ORAL_TABLET | ORAL | Status: DC

## 2013-01-31 MED ORDER — OMEPRAZOLE 20 MG PO CPDR: 20.0000 mg | DELAYED_RELEASE_CAPSULE | Freq: Every day | ORAL | Status: DC

## 2013-01-31 MED ORDER — ASPIRIN 81 MG PO TBEC: 81.0000 mg | DELAYED_RELEASE_TABLET | Freq: Every day | ORAL | Status: DC

## 2013-01-31 MED ORDER — ALENDRONATE SODIUM 70 MG PO TABS: 70.0000 mg | ORAL_TABLET | ORAL | Status: DC

## 2013-01-31 MED ORDER — BUPROPION HCL ER (XL) 150 MG PO TB24: 150.0000 mg | ORAL_TABLET | Freq: Every day | ORAL | Status: DC

## 2013-01-31 NOTE — Assessment & Plan Note (Signed)
For med refill, o/w stable
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
## 2013-01-31 NOTE — Assessment & Plan Note (Signed)
Etiology unclear - ? msk with left chronic costocondritis, has hx of gallstones, for repeat abd u/s, and trial prilosec 20

## 2013-01-31 NOTE — Assessment & Plan Note (Signed)
Per 2013 with lowest t score -1.7, for fosamax start

## 2013-01-31 NOTE — Assessment & Plan Note (Signed)

## 2013-01-31 NOTE — Patient Instructions (Addendum)
Please take all new medication as prescribed - the fosamax, and wellbutrin generic, and the prilosec Please start Aspirin 81 mg - 1 per day - Enteric Coated only, to reduce risk of stroke and heart attack Please continue all other medications as before, and refills have been done if requested - the xanax for the attacks Please call if not improved in 3-4 wks (or sooner) with the prilosec to consider GI referral Please call in 3-4 wks if you feel you may need higher strength wellbutrin You will be contacted regarding the referral for: abdomen ultrasound Please continue your efforts at being more active, low cholesterol diet, and weight control. You are otherwise up to date with prevention measures today. Please keep your appointments with your specialists as you may have planned, such as urology Please return if you change your mind about the tetanus shot. You can check the [place] heart disease risk online.   Please remember to sign up for My Chart if you have not done so, as this will be important to you in the future with finding out test results, communicating by private email, and scheduling acute appointments online when needed.  Please return in 6 months, or sooner if needed

## 2013-01-31 NOTE — Assessment & Plan Note (Signed)
Ok for wellbutrin asd,  to f/u any worsening symptoms or concerns  

## 2013-02-08 ENCOUNTER — Encounter: Payer: Self-pay | Admitting: Internal Medicine

## 2013-02-08 ENCOUNTER — Ambulatory Visit
Admission: RE | Admit: 2013-02-08 | Discharge: 2013-02-08 | Disposition: A | Discharge status: Home / Self Care | Payer: Medicare Other | Source: Ambulatory Visit | Attending: Internal Medicine | Admitting: Internal Medicine

## 2013-02-08 DIAGNOSIS — R109 Unspecified abdominal pain: Secondary | ICD-10-CM

## 2013-02-13 ENCOUNTER — Telehealth: Payer: Self-pay | Admitting: *Deleted

## 2013-02-13 NOTE — Telephone Encounter (Signed)
Left msg on triage requesting results back from U/S...lmb

## 2013-02-13 NOTE — Telephone Encounter (Signed)
See letter sent June 5

## 2013-02-14 NOTE — Telephone Encounter (Signed)
Patient inforemd 

## 2013-04-11 ENCOUNTER — Telehealth: Payer: Self-pay

## 2013-04-11 MED ORDER — BUPROPION HCL ER (XL) 300 MG PO TB24: 300.0000 mg | ORAL_TABLET | Freq: Every day | ORAL | Status: DC

## 2013-04-11 NOTE — Telephone Encounter (Signed)
Done erx 

## 2013-04-11 NOTE — Telephone Encounter (Signed)
Patient informed. 

## 2013-04-11 NOTE — Telephone Encounter (Signed)
The patient called yesterday August 5,2014 to have increase on her wellbutrin to 300 mg.  Call back number is [phone]

## 2013-08-09 ENCOUNTER — Encounter: Payer: Self-pay | Admitting: Internal Medicine

## 2013-08-09 ENCOUNTER — Ambulatory Visit (INDEPENDENT_AMBULATORY_CARE_PROVIDER_SITE_OTHER): Payer: Medicare Other | Admitting: Internal Medicine

## 2013-08-09 VITALS — BP 132/82 | HR 90 | Temp 97.3°F | Ht 63.0 in | Wt 149.2 lb

## 2013-08-09 DIAGNOSIS — Z23 Encounter for immunization: Secondary | ICD-10-CM

## 2013-08-09 DIAGNOSIS — E785 Hyperlipidemia, unspecified: Secondary | ICD-10-CM

## 2013-08-09 DIAGNOSIS — K219 Gastro-esophageal reflux disease without esophagitis: Secondary | ICD-10-CM

## 2013-08-09 DIAGNOSIS — Z0000 Encounter for general adult medical examination without abnormal findings: Secondary | ICD-10-CM

## 2013-08-09 DIAGNOSIS — F411 Generalized anxiety disorder: Secondary | ICD-10-CM

## 2013-08-09 HISTORY — DX: Gastro-esophageal reflux disease without esophagitis: K21.9

## 2013-08-09 MED ORDER — LORAZEPAM 0.5 MG PO TABS: 0.5000 mg | ORAL_TABLET | Freq: Every day | ORAL | Status: DC | PRN

## 2013-08-09 NOTE — Progress Notes (Signed)
Pre-visit discussion using our clinic review tool. No additional management support is needed unless otherwise documented below in the visit note.  

## 2013-08-09 NOTE — Assessment & Plan Note (Signed)
stable overall by history and exam, and pt to continue medical treatment as before,  to f/u any worsening symptoms or concerns Lab Results  Component Value Date   LDLCALC 80 02/14/2012
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
## 2013-08-09 NOTE — Assessment & Plan Note (Signed)
stable overall by history and exam, recent data reviewed with pt, and pt to continue medical treatment as before,  to f/u any worsening symptoms or concerns, ok to change xanax to lorazepam due to sleepiness

## 2013-08-09 NOTE — Assessment & Plan Note (Signed)
stable overall by history and exam, recent data reviewed with pt, and pt to continue medical treatment as before,  to f/u any worsening symptoms or concerns Lab Results  Component Value Date   WBC 5.2 01/30/2013   HGB 15.6* 01/30/2013   HCT 46.1* 01/30/2013   PLT 221.0 01/30/2013   GLUCOSE 101* 01/30/2013   CHOL 224* 01/30/2013   TRIG 48.0 01/30/2013   HDL 109.10 01/30/2013   LDLDIRECT 105.2 01/30/2013   LDLCALC 80 02/14/2012   ALT 15 01/30/2013   AST 19 01/30/2013   NA 135 01/30/2013   K 3.9 01/30/2013   CL 98 01/30/2013   CREATININE 0.6 01/30/2013   BUN 5* 01/30/2013   CO2 30 01/30/2013   TSH 1.13 01/30/2013

## 2013-08-09 NOTE — Patient Instructions (Addendum)
You had the flu shot today Ok to change the xanax to the lorazepam Please continue all other medications as before, and refills have been done if requested. Please have the pharmacy call with any other refills you may need.  Please return in 6 months, or sooner if needed, with Lab testing done 3-5 days before

## 2014-02-06 ENCOUNTER — Other Ambulatory Visit (INDEPENDENT_AMBULATORY_CARE_PROVIDER_SITE_OTHER): Payer: Medicare HMO

## 2014-02-06 DIAGNOSIS — E785 Hyperlipidemia, unspecified: Secondary | ICD-10-CM

## 2014-02-06 DIAGNOSIS — Z0000 Encounter for general adult medical examination without abnormal findings: Secondary | ICD-10-CM

## 2014-02-06 LAB — TSH: TSH: 1.05 u[IU]/mL (ref 0.35–4.50)

## 2014-02-06 LAB — BASIC METABOLIC PANEL
BUN: 11 mg/dL (ref 6–23)
CHLORIDE: 100 meq/L (ref 96–112)
CO2: 31 meq/L (ref 19–32)
Calcium: 9.1 mg/dL (ref 8.4–10.5)
Creatinine, Ser: 0.6 mg/dL (ref 0.4–1.2)
GFR: 105.64 mL/min (ref >60.00)
Glucose, Bld: 89 mg/dL (ref 70–99)
POTASSIUM: 4.1 meq/L (ref 3.5–5.1)
SODIUM: 139 meq/L (ref 135–145)

## 2014-02-06 LAB — HEPATIC FUNCTION PANEL
ALT: 15 U/L (ref 0–35)
AST: 17 U/L (ref 0–37)
Albumin: 3.9 g/dL (ref 3.5–5.2)
Alkaline Phosphatase: 51 U/L (ref 39–117)
BILIRUBIN TOTAL: 0.7 mg/dL (ref 0.2–1.2)
Bilirubin, Direct: 0.1 mg/dL (ref 0.0–0.3)
Total Protein: 6.6 g/dL (ref 6.0–8.3)

## 2014-02-06 LAB — CBC WITH DIFFERENTIAL/PLATELET
BASOS ABS: 0 10*3/uL (ref 0.0–0.1)
Basophils Relative: 0.4 % (ref 0.0–3.0)
Eosinophils Absolute: 0.1 10*3/uL (ref 0.0–0.7)
Eosinophils Relative: 1.4 % (ref 0.0–5.0)
HCT: 45.2 % (ref 36.0–46.0)
HEMOGLOBIN: 14.8 g/dL (ref 12.0–15.0)
Lymphocytes Relative: 31.9 % (ref 12.0–46.0)
Lymphs Abs: 1.7 10*3/uL (ref 0.7–4.0)
MCHC: 32.9 g/dL (ref 30.0–36.0)
MCV: 98.3 fl (ref 78.0–100.0)
MONO ABS: 0.3 10*3/uL (ref 0.1–1.0)
Monocytes Relative: 6.3 % (ref 3.0–12.0)
Neutro Abs: 3.2 10*3/uL (ref 1.4–7.7)
Neutrophils Relative %: 60 % (ref 43.0–77.0)
PLATELETS: 222 10*3/uL (ref 150.0–400.0)
RBC: 4.59 Mil/uL (ref 3.87–5.11)
RDW: 13 % (ref 11.5–15.5)
WBC: 5.4 10*3/uL (ref 4.0–10.5)

## 2014-02-06 LAB — URINALYSIS, ROUTINE W REFLEX MICROSCOPIC
BILIRUBIN URINE: NEGATIVE
KETONES UR: NEGATIVE
Leukocytes, UA: NEGATIVE
NITRITE: NEGATIVE
PH: 6.5 (ref 5.0–8.0)
Specific Gravity, Urine: 1.01 (ref 1.000–1.030)
Total Protein, Urine: NEGATIVE
UROBILINOGEN UA: 0.2 (ref 0.0–1.0)
Urine Glucose: NEGATIVE

## 2014-02-06 LAB — LIPID PANEL
CHOLESTEROL: 213 mg/dL — AB (ref 0–200)
HDL: 90.9 mg/dL (ref >39.00)
LDL Cholesterol: 110 mg/dL — ABNORMAL HIGH (ref 0–99)
NonHDL: 122.1
TRIGLYCERIDES: 61 mg/dL (ref 0.0–149.0)
Total CHOL/HDL Ratio: 2
VLDL: 12.2 mg/dL (ref 0.0–40.0)

## 2014-02-07 ENCOUNTER — Encounter: Payer: Self-pay | Admitting: Internal Medicine

## 2014-02-07 ENCOUNTER — Ambulatory Visit (INDEPENDENT_AMBULATORY_CARE_PROVIDER_SITE_OTHER): Payer: Medicare HMO | Admitting: Internal Medicine

## 2014-02-07 VITALS — BP 132/82 | HR 87 | Temp 98.5°F | Ht 62.5 in | Wt 151.3 lb

## 2014-02-07 DIAGNOSIS — Z23 Encounter for immunization: Secondary | ICD-10-CM

## 2014-02-07 DIAGNOSIS — Z0000 Encounter for general adult medical examination without abnormal findings: Secondary | ICD-10-CM

## 2014-02-07 MED ORDER — LORAZEPAM 0.5 MG PO TABS: 0.5000 mg | ORAL_TABLET | Freq: Every day | ORAL | Status: DC | PRN

## 2014-02-07 NOTE — Assessment & Plan Note (Signed)
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
## 2014-02-07 NOTE — Patient Instructions (Signed)
You had the new Prevnar pneumonia shot today  Please continue all other medications as before, and refills have been done if requested.  Please have the pharmacy call with any other refills you may need.  Please continue your efforts at being more active, low cholesterol diet, and weight control.  You are otherwise up to date with prevention measures today.  Please keep your appointments with your specialists as you may have planned  You are given the lab work results - OK  Please return in 1 year for your yearly visit, or sooner if needed, with Lab testing done 3-5 days before

## 2014-02-07 NOTE — Progress Notes (Signed)
Pre visit review using our clinic review tool, if applicable. No additional management support is needed unless otherwise documented below in the visit note. 

## 2014-02-08 ENCOUNTER — Telehealth: Payer: Self-pay | Admitting: Internal Medicine

## 2014-02-08 NOTE — Telephone Encounter (Signed)
Relevant patient education mailed to patient.  

## 2014-08-08 ENCOUNTER — Ambulatory Visit (INDEPENDENT_AMBULATORY_CARE_PROVIDER_SITE_OTHER): Payer: Medicare HMO | Admitting: Internal Medicine

## 2014-08-08 ENCOUNTER — Encounter: Payer: Self-pay | Admitting: Internal Medicine

## 2014-08-08 VITALS — BP 132/78 | HR 79 | Temp 98.1°F | Ht 63.0 in | Wt 150.5 lb

## 2014-08-08 DIAGNOSIS — R062 Wheezing: Secondary | ICD-10-CM

## 2014-08-08 DIAGNOSIS — J019 Acute sinusitis, unspecified: Secondary | ICD-10-CM | POA: Insufficient documentation

## 2014-08-08 DIAGNOSIS — F329 Major depressive disorder, single episode, unspecified: Secondary | ICD-10-CM

## 2014-08-08 DIAGNOSIS — F32A Depression, unspecified: Secondary | ICD-10-CM

## 2014-08-08 DIAGNOSIS — F419 Anxiety disorder, unspecified: Secondary | ICD-10-CM

## 2014-08-08 DIAGNOSIS — F418 Other specified anxiety disorders: Secondary | ICD-10-CM

## 2014-08-08 DIAGNOSIS — J018 Other acute sinusitis: Secondary | ICD-10-CM

## 2014-08-08 MED ORDER — ESCITALOPRAM OXALATE 10 MG PO TABS: 10.0000 mg | ORAL_TABLET | Freq: Every day | ORAL | Status: DC

## 2014-08-08 MED ORDER — LEVOFLOXACIN 250 MG PO TABS: 250.0000 mg | ORAL_TABLET | Freq: Every day | ORAL | Status: DC

## 2014-08-08 MED ORDER — LORAZEPAM 0.5 MG PO TABS: 0.5000 mg | ORAL_TABLET | Freq: Two times a day (BID) | ORAL | Status: DC | PRN

## 2014-08-08 MED ORDER — PREDNISONE 10 MG PO TABS: ORAL_TABLET | ORAL | Status: DC

## 2014-08-08 MED ORDER — ALBUTEROL SULFATE HFA 108 (90 BASE) MCG/ACT IN AERS: 2.0000 | INHALATION_SPRAY | Freq: Four times a day (QID) | RESPIRATORY_TRACT | Status: DC | PRN

## 2014-08-08 NOTE — Patient Instructions (Signed)
Please take all new medication as prescribed - the antibiotic, prednisone, inhaler and lexapro  You can also take Delsym OTC for cough, and/or Mucinex (or it's generic off brand) for congestion, and tylenol as needed for pain.  Please continue all other medications as before, and refills have been done if requested - the lorazepam  Please have the pharmacy call with any other refills you may need.  Please continue your efforts at being more active, low cholesterol diet, and weight control.  Please keep your appointments with your specialists as you may have planned  .

## 2014-08-08 NOTE — Progress Notes (Signed)
Pre visit review using our clinic review tool, if applicable. No additional management support is needed unless otherwise documented below in the visit note. 

## 2014-08-09 ENCOUNTER — Telehealth: Payer: Self-pay | Admitting: Internal Medicine

## 2014-08-09 NOTE — Telephone Encounter (Signed)
emmi emailed °

## 2014-08-10 NOTE — Assessment & Plan Note (Signed)
/  Mild to mod, for predpac asd,  to f/u any worsening symptoms or concerns 
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
## 2014-08-10 NOTE — Assessment & Plan Note (Signed)
Mild to mod, for antibx course,  to f/u any worsening symptoms or concerns 

## 2014-08-10 NOTE — Assessment & Plan Note (Signed)
For leapro 10 qd, lorazepam prn,  to f/u any worsening symptoms or concerns, declines counseling referral

## 2014-08-13 ENCOUNTER — Telehealth: Payer: Self-pay | Admitting: Internal Medicine

## 2014-08-13 NOTE — Telephone Encounter (Signed)
Please advise 

## 2014-08-13 NOTE — Telephone Encounter (Signed)
Patient notified

## 2014-08-13 NOTE — Telephone Encounter (Signed)
Sorry if too many pills, this was an error, only needs to take as prescribed for 5 days

## 2014-08-13 NOTE — Telephone Encounter (Signed)
Pt called stated that she was instructed to take prednisone 2 tab for 5 days but the pharmacy gave her 18 tab. Pt is not sure if she need to take the whole thing or just as directed because she got 8 take extra. Please call pt

## 2014-11-05 HISTORY — PX: EYE SURGERY: SHX253

## 2014-11-15 ENCOUNTER — Telehealth: Payer: Self-pay

## 2014-11-15 NOTE — Telephone Encounter (Signed)
LVM for pt to call back.   RE: Flu Vaccine for 2015/2016 season 

## 2014-11-15 NOTE — Telephone Encounter (Signed)
Patient called and stated that she is not going to get her flu shot

## 2015-02-05 ENCOUNTER — Telehealth: Payer: Self-pay

## 2015-02-05 NOTE — Telephone Encounter (Signed)
Scheduled patient.  Did not have this time slot available so I put patient in 10:15 slot for 12:15 appt

## 2015-02-05 NOTE — Telephone Encounter (Signed)
LVM for AWV; requested the patient come in around 12:15 for AWV prior to apt with [name] on 6/9 at 1pm.

## 2015-02-05 NOTE — Telephone Encounter (Signed)
LVM with the patient that I can arrange to meet with her at 12; 15 prior to her 1pm with [name]. I will edit my schedule unless she prefers to come in at 10:15 and then leave for lunch prior to her apt with [name]; Awaiting call back and will align my schedule.

## 2015-02-10 ENCOUNTER — Other Ambulatory Visit (INDEPENDENT_AMBULATORY_CARE_PROVIDER_SITE_OTHER): Payer: Medicare HMO

## 2015-02-10 DIAGNOSIS — Z0000 Encounter for general adult medical examination without abnormal findings: Secondary | ICD-10-CM | POA: Diagnosis not present

## 2015-02-10 DIAGNOSIS — E785 Hyperlipidemia, unspecified: Secondary | ICD-10-CM | POA: Diagnosis not present

## 2015-02-10 LAB — CBC WITH DIFFERENTIAL/PLATELET
BASOS PCT: 0.4 % (ref 0.0–3.0)
Basophils Absolute: 0 10*3/uL (ref 0.0–0.1)
Eosinophils Absolute: 0.1 10*3/uL (ref 0.0–0.7)
Eosinophils Relative: 1.1 % (ref 0.0–5.0)
HEMATOCRIT: 48.1 % — AB (ref 36.0–46.0)
HEMOGLOBIN: 16 g/dL — AB (ref 12.0–15.0)
LYMPHS ABS: 1.5 10*3/uL (ref 0.7–4.0)
Lymphocytes Relative: 21.1 % (ref 12.0–46.0)
MCHC: 33.2 g/dL (ref 30.0–36.0)
MCV: 96.6 fl (ref 78.0–100.0)
MONO ABS: 0.5 10*3/uL (ref 0.1–1.0)
Monocytes Relative: 6.5 % (ref 3.0–12.0)
Neutro Abs: 5 10*3/uL (ref 1.4–7.7)
Neutrophils Relative %: 70.9 % (ref 43.0–77.0)
PLATELETS: 228 10*3/uL (ref 150.0–400.0)
RBC: 4.98 Mil/uL (ref 3.87–5.11)
RDW: 12.7 % (ref 11.5–15.5)
WBC: 7 10*3/uL (ref 4.0–10.5)

## 2015-02-10 LAB — HEPATIC FUNCTION PANEL
ALT: 10 U/L (ref 0–35)
AST: 12 U/L (ref 0–37)
Albumin: 4 g/dL (ref 3.5–5.2)
Alkaline Phosphatase: 68 U/L (ref 39–117)
BILIRUBIN TOTAL: 0.8 mg/dL (ref 0.2–1.2)
Bilirubin, Direct: 0.2 mg/dL (ref 0.0–0.3)
Total Protein: 6.7 g/dL (ref 6.0–8.3)

## 2015-02-10 LAB — URINALYSIS, ROUTINE W REFLEX MICROSCOPIC
Bilirubin Urine: NEGATIVE
Ketones, ur: NEGATIVE
Leukocytes, UA: NEGATIVE
Nitrite: NEGATIVE
Specific Gravity, Urine: 1.01 (ref 1.000–1.030)
Total Protein, Urine: NEGATIVE
URINE GLUCOSE: NEGATIVE
Urobilinogen, UA: 1 (ref 0.0–1.0)
pH: 7.5 (ref 5.0–8.0)

## 2015-02-10 LAB — TSH: TSH: 0.39 u[IU]/mL (ref 0.35–4.50)

## 2015-02-10 LAB — BASIC METABOLIC PANEL
BUN: 9 mg/dL (ref 6–23)
CO2: 33 meq/L — AB (ref 19–32)
Calcium: 9.1 mg/dL (ref 8.4–10.5)
Chloride: 96 mEq/L (ref 96–112)
Creatinine, Ser: 0.57 mg/dL (ref 0.40–1.20)
GFR: 111.75 mL/min (ref >60.00)
Glucose, Bld: 98 mg/dL (ref 70–99)
POTASSIUM: 4.5 meq/L (ref 3.5–5.1)
SODIUM: 133 meq/L — AB (ref 135–145)

## 2015-02-10 LAB — LIPID PANEL
Cholesterol: 226 mg/dL — ABNORMAL HIGH (ref 0–200)
HDL: 85.6 mg/dL (ref >39.00)
LDL Cholesterol: 113 mg/dL — ABNORMAL HIGH (ref 0–99)
NonHDL: 140.4
Total CHOL/HDL Ratio: 3
Triglycerides: 138 mg/dL (ref 0.0–149.0)
VLDL: 27.6 mg/dL (ref 0.0–40.0)

## 2015-02-13 ENCOUNTER — Encounter: Payer: Self-pay | Admitting: Internal Medicine

## 2015-02-13 ENCOUNTER — Ambulatory Visit: Payer: Medicare HMO

## 2015-02-13 ENCOUNTER — Ambulatory Visit (INDEPENDENT_AMBULATORY_CARE_PROVIDER_SITE_OTHER): Payer: Medicare HMO | Admitting: Internal Medicine

## 2015-02-13 VITALS — BP 130/80 | HR 87 | Temp 98.6°F | Ht 63.0 in | Wt 150.1 lb

## 2015-02-13 DIAGNOSIS — Z0000 Encounter for general adult medical examination without abnormal findings: Secondary | ICD-10-CM

## 2015-02-13 DIAGNOSIS — D751 Secondary polycythemia: Secondary | ICD-10-CM | POA: Diagnosis not present

## 2015-02-13 DIAGNOSIS — M5416 Radiculopathy, lumbar region: Secondary | ICD-10-CM | POA: Diagnosis not present

## 2015-02-13 DIAGNOSIS — Z0001 Encounter for general adult medical examination with abnormal findings: Secondary | ICD-10-CM | POA: Insufficient documentation

## 2015-02-13 DIAGNOSIS — E785 Hyperlipidemia, unspecified: Secondary | ICD-10-CM | POA: Diagnosis not present

## 2015-02-13 MED ORDER — LORAZEPAM 0.5 MG PO TABS: 0.5000 mg | ORAL_TABLET | Freq: Two times a day (BID) | ORAL | Status: DC | PRN

## 2015-02-13 MED ORDER — TRAMADOL HCL 50 MG PO TABS: 50.0000 mg | ORAL_TABLET | Freq: Four times a day (QID) | ORAL | Status: DC | PRN

## 2015-02-13 NOTE — Patient Instructions (Addendum)
Please take all new medication as prescribed - the tramadol  Please continue all other medications as before, and refills have been done if requested.  Please have the pharmacy call with any other refills you may need.  Please continue your efforts at being more active, low cholesterol diet, and weight control.  You are otherwise up to date with prevention measures today.  Please keep your appointments with your specialists as you may have planned  You will be contacted regarding the referral for: MRI, and Neurosurgury referrals  Please remember to sign up for MyChart if you have not done so, as this will be important to you in the future with finding out test results, communicating by private email, and scheduling acute appointments online when needed.  Please return in 1 year for your yearly visit, or sooner if needed, with Lab testing done 3-5 days before       Natalie Smith , Thank you for taking time to come for your Medicare Wellness Visit. I appreciate your ongoing commitment to your health goals. Please review the following plan we discussed and let me know if I can assist you in the future.  Smoking;  Educated to avoid secondary smoke Smoking cessation at [facility]: [phone] Meds may help; chatix (Varenicline); Zyban (Bupropion SR); Nicotine Replacement (gum; lozenges; patches; etc.)  30 pack yr smoking hx: Educated regarding LDCT; To discuss with MD at next fup.    These are the goals we discussed: Goals    . Quit smoking / using tobacco     Smoking;  Educated to avoid secondary smoke Smoking cessation at [facility] [phone] Meds may help; chatix (Varenicline); Zyban (Bupropion SR); Nicotine Replacement (gum; lozenges; patches; etc.)  30 pack yr smoking hx: Educated regarding LDCT; To discuss with MD at next fup./ reviewed but declines A       This is a list of the screening recommended for you and due dates:  Health Maintenance  Topic Date Due  . Tetanus  Vaccine  01/12/1965  . Mammogram  09/07/1999  . Flu Shot  04/07/2015  . Colon Cancer Screening  11/20/2018  . DEXA scan (bone density measurement)  Completed  . Shingles Vaccine  Completed  . Pneumonia vaccines  Completed     Health Maintenance Adopting a healthy lifestyle and getting preventive care can go a long way to promote health and wellness. Talk with your health care provider about what schedule of regular examinations is right for you. This is a good chance for you to check in with your provider about disease prevention and staying healthy. In between checkups, there are plenty of things you can do on your own. Experts have done a lot of research about which lifestyle changes and preventive measures are most likely to keep you healthy. Ask your health care provider for more information. WEIGHT AND DIET  Eat a healthy diet  Be sure to include plenty of vegetables, fruits, low-fat dairy products, and lean protein.  Do not eat a lot of foods high in solid fats, added sugars, or salt.  Get regular exercise. This is one of the most important things you can do for your health.  Most adults should exercise for at least 150 minutes each week. The exercise should increase your heart rate and make you sweat (moderate-intensity exercise).  Most adults should also do strengthening exercises at least twice a week. This is in addition to the moderate-intensity exercise.  Maintain a healthy weight  Body mass index (BMI) is  a measurement that can be used to identify possible weight problems. It estimates body fat based on height and weight. Your health care provider can help determine your BMI and help you achieve or maintain a healthy weight.  For females 28 years of age and older:   A BMI below 18.5 is considered underweight.  A BMI of 18.5 to 24.9 is normal.  A BMI of 25 to 29.9 is considered overweight.  A BMI of 30 and above is considered obese.  Watch levels of cholesterol  and blood lipids  You should start having your blood tested for lipids and cholesterol at 69 years of age, then have this test every 5 years.  You may need to have your cholesterol levels checked more often if:  Your lipid or cholesterol levels are high.  You are older than 70 years of age.  You are at high risk for heart disease.  CANCER SCREENING   Lung Cancer  Lung cancer screening is recommended for adults 33-40 years old who are at high risk for lung cancer because of a history of smoking.  A yearly low-dose CT scan of the lungs is recommended for people who:  Currently smoke.  Have quit within the past 15 years.  Have at least a 30-pack-year history of smoking. A pack year is smoking an average of one pack of cigarettes a day for 1 year.  Yearly screening should continue until it has been 15 years since you quit.  Yearly screening should stop if you develop a health problem that would prevent you from having lung cancer treatment.  Breast Cancer  Practice breast self-awareness. This means understanding how your breasts normally appear and feel.  It also means doing regular breast self-exams. Let your health care provider know about any changes, no matter how small.  If you are in your 20s or 30s, you should have a clinical breast exam (CBE) by a health care provider every 1-3 years as part of a regular health exam.  If you are 68 or older, have a CBE every year. Also consider having a breast X-ray (mammogram) every year.  If you have a family history of breast cancer, talk to your health care provider about genetic screening.  If you are at high risk for breast cancer, talk to your health care provider about having an MRI and a mammogram every year.  Breast cancer gene (BRCA) assessment is recommended for women who have family members with BRCA-related cancers. BRCA-related cancers include:  Breast.  Ovarian.  Tubal.  Peritoneal cancers.  Results of the  assessment will determine the need for genetic counseling and BRCA1 and BRCA2 testing. Cervical Cancer Routine pelvic examinations to screen for cervical cancer are no longer recommended for nonpregnant women who are considered low risk for cancer of the pelvic organs (ovaries, uterus, and vagina) and who do not have symptoms. A pelvic examination may be necessary if you have symptoms including those associated with pelvic infections. Ask your health care provider if a screening pelvic exam is right for you.   The Pap test is the screening test for cervical cancer for women who are considered at risk.  If you had a hysterectomy for a problem that was not cancer or a condition that could lead to cancer, then you no longer need Pap tests.  If you are older than 65 years, and you have had normal Pap tests for the past 10 years, you no longer need to have Pap tests.  If you have had past treatment for cervical cancer or a condition that could lead to cancer, you need Pap tests and screening for cancer for at least 20 years after your treatment.  If you no longer get a Pap test, assess your risk factors if they change (such as having a new sexual partner). This can affect whether you should start being screened again.  Some women have medical problems that increase their chance of getting cervical cancer. If this is the case for you, your health care provider may recommend more frequent screening and Pap tests.  The human papillomavirus (HPV) test is another test that may be used for cervical cancer screening. The HPV test looks for the virus that can cause cell changes in the cervix. The cells collected during the Pap test can be tested for HPV.  The HPV test can be used to screen women 69 years of age and older. Getting tested for HPV can extend the interval between normal Pap tests from three to five years.  An HPV test also should be used to screen women of any age who have unclear Pap test  results.  After 69 years of age, women should have HPV testing as often as Pap tests.  Colorectal Cancer  This type of cancer can be detected and often prevented.  Routine colorectal cancer screening usually begins at 69 years of age and continues through 69 years of age.  Your health care provider may recommend screening at an earlier age if you have risk factors for colon cancer.  Your health care provider may also recommend using home test kits to check for hidden blood in the stool.  A small camera at the end of a tube can be used to examine your colon directly (sigmoidoscopy or colonoscopy). This is done to check for the earliest forms of colorectal cancer.  Routine screening usually begins at age 70.  Direct examination of the colon should be repeated every 5-10 years through 69 years of age. However, you may need to be screened more often if early forms of precancerous polyps or small growths are found. Skin Cancer  Check your skin from head to toe regularly.  Tell your health care provider about any new moles or changes in moles, especially if there is a change in a mole's shape or color.  Also tell your health care provider if you have a mole that is larger than the size of a pencil eraser.  Always use sunscreen. Apply sunscreen liberally and repeatedly throughout the day.  Protect yourself by wearing long sleeves, pants, a wide-brimmed hat, and sunglasses whenever you are outside. HEART DISEASE, DIABETES, AND HIGH BLOOD PRESSURE   Have your blood pressure checked at least every 1-2 years. High blood pressure causes heart disease and increases the risk of stroke.  If you are between 52 years and 29 years old, ask your health care provider if you should take aspirin to prevent strokes.  Have regular diabetes screenings. This involves taking a blood sample to check your fasting blood sugar level.  If you are at a normal weight and have a low risk for diabetes, have this  test once every three years after 69 years of age.  If you are overweight and have a high risk for diabetes, consider being tested at a younger age or more often. PREVENTING INFECTION  Hepatitis B  If you have a higher risk for hepatitis B, you should be screened for this virus. You are considered  at high risk for hepatitis B if:  You were born in a country where hepatitis B is common. Ask your health care provider which countries are considered high risk.  Your parents were born in a high-risk country, and you have not been immunized against hepatitis B (hepatitis B vaccine).  You have HIV or AIDS.  You use needles to inject street drugs.  You live with someone who has hepatitis B.  You have had sex with someone who has hepatitis B.  You get hemodialysis treatment.  You take certain medicines for conditions, including cancer, organ transplantation, and autoimmune conditions. Hepatitis C  Blood testing is recommended for:  Everyone born from [birth year] through 1965.  Anyone with known risk factors for hepatitis C. Sexually transmitted infections (STIs)  You should be screened for sexually transmitted infections (STIs) including gonorrhea and chlamydia if:  You are sexually active and are younger than 69 years of age.  You are older than 69 years of age and your health care provider tells you that you are at risk for this type of infection.  Your sexual activity has changed since you were last screened and you are at an increased risk for chlamydia or gonorrhea. Ask your health care provider if you are at risk.  If you do not have HIV, but are at risk, it may be recommended that you take a prescription medicine daily to prevent HIV infection. This is called pre-exposure prophylaxis (PrEP). You are considered at risk if:  You are sexually active and do not regularly use condoms or know the HIV status of your partner(s).  You take drugs by injection.  You are sexually active with  a partner who has HIV. Talk with your health care provider about whether you are at high risk of being infected with HIV. If you choose to begin PrEP, you should first be tested for HIV. You should then be tested every 3 months for as long as you are taking PrEP.  PREGNANCY   If you are premenopausal and you may become pregnant, ask your health care provider about preconception counseling.  If you may become pregnant, take 400 to 800 micrograms (mcg) of folic acid every day.  If you want to prevent pregnancy, talk to your health care provider about birth control (contraception). OSTEOPOROSIS AND MENOPAUSE   Osteoporosis is a disease in which the bones lose minerals and strength with aging. This can result in serious bone fractures. Your risk for osteoporosis can be identified using a bone density scan.  If you are 47 years of age or older, or if you are at risk for osteoporosis and fractures, ask your health care provider if you should be screened.  Ask your health care provider whether you should take a calcium or vitamin D supplement to lower your risk for osteoporosis.  Menopause may have certain physical symptoms and risks.  Hormone replacement therapy may reduce some of these symptoms and risks. Talk to your health care provider about whether hormone replacement therapy is right for you.  HOME CARE INSTRUCTIONS   Schedule regular health, dental, and eye exams.  Stay current with your immunizations.   Do not use any tobacco products including cigarettes, chewing tobacco, or electronic cigarettes.  If you are pregnant, do not drink alcohol.  If you are breastfeeding, limit how much and how often you drink alcohol.  Limit alcohol intake to no more than 1 drink per day for nonpregnant women. One drink equals 12 ounces of beer,  5 ounces of wine, or 1 ounces of hard liquor.  Do not use street drugs.  Do not share needles.  Ask your health care provider for help if you need  support or information about quitting drugs.  Tell your health care provider if you often feel depressed.  Tell your health care provider if you have ever been abused or do not feel safe at home. Document Released: 03/08/2011 Document Revised: 01/07/2014 Document Reviewed: 07/25/2013 [facility] Patient Information 2015 [place], [place]. This information is not intended to replace advice given to you by your health care provider. Make sure you discuss any questions you have with your health care provider.
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
## 2015-02-15 NOTE — Assessment & Plan Note (Signed)

## 2015-02-15 NOTE — Assessment & Plan Note (Addendum)
With neuro change, for pain control, MRI LS spine, refer neurosurgury

## 2015-02-15 NOTE — Assessment & Plan Note (Signed)
Lab Results  Component Value Date   LDLCALC 113* 02/10/2015   D/w pt, for lower chol diet, declines statin

## 2015-02-15 NOTE — Assessment & Plan Note (Addendum)
Mild, etiology unclear Lab Results  Component Value Date   WBC 7.0 02/10/2015   HGB 16.0* 02/10/2015   HCT 48.1* 02/10/2015   MCV 96.6 02/10/2015   PLT 228.0 02/10/2015  ' Declines heme referral, will need f/u lab next visit or soon after

## 2015-03-06 ENCOUNTER — Telehealth: Payer: Self-pay | Admitting: Internal Medicine

## 2015-03-06 DIAGNOSIS — M5416 Radiculopathy, lumbar region: Secondary | ICD-10-CM

## 2015-03-06 NOTE — Telephone Encounter (Signed)
Insurance denied pt MRI if you would like to do a peer to peer you will need to call [phone] opt.4 and then opt.2. Use reference/case   number [id number]

## 2015-03-06 NOTE — Telephone Encounter (Signed)
Noted  Will need change referral to orthopedic, as NS will not see without MRI

## 2015-03-14 MED ORDER — HYDROCODONE-ACETAMINOPHEN 5-325 MG PO TABS: 1.0000 | ORAL_TABLET | Freq: Four times a day (QID) | ORAL | Status: DC | PRN

## 2015-03-14 NOTE — Telephone Encounter (Signed)
Pt states that Tramadol causes intense itching and dizziness (added to allergy list). Pt is requesting alternative medication

## 2015-03-14 NOTE — Telephone Encounter (Signed)
Ok or hydrocodone prn - Done hardcopy to [employer]

## 2015-03-14 NOTE — Telephone Encounter (Signed)
Patient stated that she had a bad reaction to Tramadol, please advise

## 2015-03-17 NOTE — Telephone Encounter (Signed)
Pt informed, Rx in cabinet for pt pick up  

## 2015-07-08 ENCOUNTER — Encounter: Payer: Self-pay | Admitting: Internal Medicine

## 2015-09-10 DIAGNOSIS — H01005 Unspecified blepharitis left lower eyelid: Secondary | ICD-10-CM | POA: Diagnosis not present

## 2015-09-10 DIAGNOSIS — H01004 Unspecified blepharitis left upper eyelid: Secondary | ICD-10-CM | POA: Diagnosis not present

## 2015-09-10 DIAGNOSIS — H01002 Unspecified blepharitis right lower eyelid: Secondary | ICD-10-CM | POA: Diagnosis not present

## 2015-09-10 DIAGNOSIS — H01001 Unspecified blepharitis right upper eyelid: Secondary | ICD-10-CM | POA: Diagnosis not present

## 2015-10-17 ENCOUNTER — Telehealth: Payer: Self-pay | Admitting: *Deleted

## 2015-10-17 NOTE — Telephone Encounter (Signed)
Left msg on triage stating her Lorazepam has expired requesting new rx. Called pt inform she will need OV last saw md back in 2015.Made appt for 10/21/15 @ 4:15....Raechel Chute

## 2015-10-21 ENCOUNTER — Encounter: Payer: Self-pay | Admitting: Internal Medicine

## 2015-10-21 ENCOUNTER — Ambulatory Visit (INDEPENDENT_AMBULATORY_CARE_PROVIDER_SITE_OTHER): Payer: Medicare HMO | Admitting: Internal Medicine

## 2015-10-21 VITALS — BP 122/70 | HR 76 | Temp 98.4°F | Ht 63.0 in | Wt 146.8 lb

## 2015-10-21 DIAGNOSIS — Z0189 Encounter for other specified special examinations: Secondary | ICD-10-CM | POA: Diagnosis not present

## 2015-10-21 DIAGNOSIS — F418 Other specified anxiety disorders: Secondary | ICD-10-CM | POA: Diagnosis not present

## 2015-10-21 DIAGNOSIS — E079 Disorder of thyroid, unspecified: Secondary | ICD-10-CM

## 2015-10-21 DIAGNOSIS — F32A Depression, unspecified: Secondary | ICD-10-CM

## 2015-10-21 DIAGNOSIS — F329 Major depressive disorder, single episode, unspecified: Secondary | ICD-10-CM

## 2015-10-21 DIAGNOSIS — F419 Anxiety disorder, unspecified: Principal | ICD-10-CM

## 2015-10-21 DIAGNOSIS — R5383 Other fatigue: Secondary | ICD-10-CM

## 2015-10-21 DIAGNOSIS — Z0000 Encounter for general adult medical examination without abnormal findings: Secondary | ICD-10-CM

## 2015-10-21 MED ORDER — LORAZEPAM 0.5 MG PO TABS: 0.5000 mg | ORAL_TABLET | Freq: Two times a day (BID) | ORAL | Status: DC | PRN

## 2015-10-21 NOTE — Progress Notes (Signed)
Pre visit review using our clinic review tool, if applicable. No additional management support is needed unless otherwise documented below in the visit note. 

## 2015-10-21 NOTE — Assessment & Plan Note (Signed)
Declines tsh for now, wants to check with next labs,  Lab Results  Component Value Date   TSH 0.39 02/10/2015

## 2015-10-21 NOTE — Patient Instructions (Signed)
Please continue all other medications as before, and refills have been done if requested.  Please have the pharmacy call with any other refills you may need.  Please continue your efforts at being more active, low cholesterol diet, and weight control.  Please keep your appointments with your specialists as you may have planned     

## 2015-10-21 NOTE — Assessment & Plan Note (Signed)
Etiology unclear, Exam otherwise benign, declines lab eval, follow with expectant management

## 2015-10-21 NOTE — Assessment & Plan Note (Signed)
stable overall by history and exam, recent data reviewed with pt, and pt to continue medical treatment as before,  to f/u any worsening symptoms or concerns Lab Results  Component Value Date   WBC 7.0 02/10/2015   HGB 16.0* 02/10/2015   HCT 48.1* 02/10/2015   PLT 228.0 02/10/2015   GLUCOSE 98 02/10/2015   CHOL 226* 02/10/2015   TRIG 138.0 02/10/2015   HDL 85.60 02/10/2015   LDLDIRECT 105.2 01/30/2013   LDLCALC 113* 02/10/2015   ALT 10 02/10/2015   AST 12 02/10/2015   NA 133* 02/10/2015   K 4.5 02/10/2015   CL 96 02/10/2015   CREATININE 0.57 02/10/2015   BUN 9 02/10/2015   CO2 33* 02/10/2015   TSH 0.39 02/10/2015   For refill ativan prn
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
## 2015-12-05 DIAGNOSIS — J22 Unspecified acute lower respiratory infection: Secondary | ICD-10-CM | POA: Diagnosis not present

## 2015-12-05 DIAGNOSIS — J019 Acute sinusitis, unspecified: Secondary | ICD-10-CM | POA: Diagnosis not present

## 2015-12-05 DIAGNOSIS — J029 Acute pharyngitis, unspecified: Secondary | ICD-10-CM | POA: Diagnosis not present

## 2015-12-05 DIAGNOSIS — R6889 Other general symptoms and signs: Secondary | ICD-10-CM | POA: Diagnosis not present

## 2015-12-05 DIAGNOSIS — R0602 Shortness of breath: Secondary | ICD-10-CM | POA: Diagnosis not present

## 2015-12-05 DIAGNOSIS — B9689 Other specified bacterial agents as the cause of diseases classified elsewhere: Secondary | ICD-10-CM | POA: Diagnosis not present

## 2016-02-24 ENCOUNTER — Other Ambulatory Visit (INDEPENDENT_AMBULATORY_CARE_PROVIDER_SITE_OTHER): Payer: Medicare HMO

## 2016-02-24 ENCOUNTER — Telehealth: Payer: Self-pay

## 2016-02-24 DIAGNOSIS — Z0189 Encounter for other specified special examinations: Secondary | ICD-10-CM

## 2016-02-24 DIAGNOSIS — Z0000 Encounter for general adult medical examination without abnormal findings: Secondary | ICD-10-CM

## 2016-02-24 LAB — LIPID PANEL
CHOL/HDL RATIO: 2
Cholesterol: 221 mg/dL — ABNORMAL HIGH (ref 0–200)
HDL: 96.8 mg/dL (ref >39.00)
LDL CALC: 111 mg/dL — AB (ref 0–99)
NONHDL: 123.82
TRIGLYCERIDES: 66 mg/dL (ref 0.0–149.0)
VLDL: 13.2 mg/dL (ref 0.0–40.0)

## 2016-02-24 LAB — URINALYSIS, ROUTINE W REFLEX MICROSCOPIC
BILIRUBIN URINE: NEGATIVE
KETONES UR: NEGATIVE
Leukocytes, UA: NEGATIVE
NITRITE: NEGATIVE
Specific Gravity, Urine: 1.01 (ref 1.000–1.030)
Total Protein, Urine: NEGATIVE
URINE GLUCOSE: NEGATIVE
UROBILINOGEN UA: 0.2 (ref 0.0–1.0)
pH: 7 (ref 5.0–8.0)

## 2016-02-24 LAB — HEPATIC FUNCTION PANEL
ALT: 13 U/L (ref 0–35)
AST: 14 U/L (ref 0–37)
Albumin: 4.2 g/dL (ref 3.5–5.2)
Alkaline Phosphatase: 63 U/L (ref 39–117)
BILIRUBIN DIRECT: 0.1 mg/dL (ref 0.0–0.3)
BILIRUBIN TOTAL: 0.6 mg/dL (ref 0.2–1.2)
TOTAL PROTEIN: 6.9 g/dL (ref 6.0–8.3)

## 2016-02-24 LAB — BASIC METABOLIC PANEL
BUN: 9 mg/dL (ref 6–23)
CALCIUM: 9.5 mg/dL (ref 8.4–10.5)
CHLORIDE: 100 meq/L (ref 96–112)
CO2: 31 mEq/L (ref 19–32)
CREATININE: 0.63 mg/dL (ref 0.40–1.20)
GFR: 99.26 mL/min (ref >60.00)
Glucose, Bld: 89 mg/dL (ref 70–99)
Potassium: 3.9 mEq/L (ref 3.5–5.1)
Sodium: 139 mEq/L (ref 135–145)

## 2016-02-24 LAB — CBC WITH DIFFERENTIAL/PLATELET
BASOS ABS: 0 10*3/uL (ref 0.0–0.1)
BASOS PCT: 0.3 % (ref 0.0–3.0)
EOS ABS: 0.1 10*3/uL (ref 0.0–0.7)
Eosinophils Relative: 1 % (ref 0.0–5.0)
HEMATOCRIT: 46.4 % — AB (ref 36.0–46.0)
HEMOGLOBIN: 15.6 g/dL — AB (ref 12.0–15.0)
LYMPHS PCT: 24.8 % (ref 12.0–46.0)
Lymphs Abs: 1.9 10*3/uL (ref 0.7–4.0)
MCHC: 33.6 g/dL (ref 30.0–36.0)
MCV: 98.1 fl (ref 78.0–100.0)
MONOS PCT: 6.7 % (ref 3.0–12.0)
Monocytes Absolute: 0.5 10*3/uL (ref 0.1–1.0)
NEUTROS ABS: 5.1 10*3/uL (ref 1.4–7.7)
Neutrophils Relative %: 67.2 % (ref 43.0–77.0)
Platelets: 233 10*3/uL (ref 150.0–400.0)
RBC: 4.73 Mil/uL (ref 3.87–5.11)
RDW: 13.9 % (ref 11.5–15.5)
WBC: 7.5 10*3/uL (ref 4.0–10.5)

## 2016-02-24 LAB — TSH: TSH: 1.68 u[IU]/mL (ref 0.35–4.50)

## 2016-02-24 NOTE — Telephone Encounter (Signed)
Call to Ms. Gains; Agreed to try to stay for AWV following Dr. Raphael GibneyJohn's apt on Thursday; If not, may schedule for a later time.

## 2016-02-26 ENCOUNTER — Encounter: Payer: Self-pay | Admitting: Internal Medicine

## 2016-02-26 ENCOUNTER — Ambulatory Visit (INDEPENDENT_AMBULATORY_CARE_PROVIDER_SITE_OTHER): Payer: Medicare HMO | Admitting: Internal Medicine

## 2016-02-26 VITALS — BP 124/90 | HR 89 | Temp 98.0°F | Ht 62.0 in | Wt 139.1 lb

## 2016-02-26 DIAGNOSIS — Z0000 Encounter for general adult medical examination without abnormal findings: Secondary | ICD-10-CM

## 2016-02-26 DIAGNOSIS — F418 Other specified anxiety disorders: Secondary | ICD-10-CM | POA: Diagnosis not present

## 2016-02-26 DIAGNOSIS — Z1159 Encounter for screening for other viral diseases: Secondary | ICD-10-CM | POA: Diagnosis not present

## 2016-02-26 DIAGNOSIS — D751 Secondary polycythemia: Secondary | ICD-10-CM | POA: Diagnosis not present

## 2016-02-26 DIAGNOSIS — F32A Depression, unspecified: Secondary | ICD-10-CM

## 2016-02-26 DIAGNOSIS — F419 Anxiety disorder, unspecified: Secondary | ICD-10-CM

## 2016-02-26 DIAGNOSIS — E785 Hyperlipidemia, unspecified: Secondary | ICD-10-CM | POA: Diagnosis not present

## 2016-02-26 DIAGNOSIS — G4710 Hypersomnia, unspecified: Secondary | ICD-10-CM

## 2016-02-26 DIAGNOSIS — F329 Major depressive disorder, single episode, unspecified: Secondary | ICD-10-CM

## 2016-02-26 DIAGNOSIS — R3129 Other microscopic hematuria: Secondary | ICD-10-CM

## 2016-02-26 DIAGNOSIS — R69 Illness, unspecified: Secondary | ICD-10-CM | POA: Diagnosis not present

## 2016-02-26 MED ORDER — ESCITALOPRAM OXALATE 10 MG PO TABS: 10.0000 mg | ORAL_TABLET | Freq: Every day | ORAL | Status: DC

## 2016-02-26 NOTE — Patient Instructions (Addendum)
Please take all new medication as prescribed - the Lexapro 10 mg per day  Please continue all other medications as before, and refills have been done if requested.  Please have the pharmacy call with any other refills you may need.  Please continue your efforts at being more active, low cholesterol diet, and weight control.  You are otherwise up to date with prevention measures today.  Please keep your appointments with your specialists as you may have planned  You will be contacted regarding the referral for: Overnight oximetry per [facility]  Please return in 6 months, or sooner if needed   Natalie Smith , Thank you for taking time to come for your Medicare Wellness Visit. I appreciate your ongoing commitment to your health goals. Please review the following plan we discussed and let me know if I can assist you in the future.   These are the goals we discussed: Goals    . patient     To feel better;     Marland Kitchen Quit smoking / using tobacco     Smoking;  Educated to avoid secondary smoke Smoking cessation at [facility]: [phone] Meds may help; chatix (Varenicline); Zyban (Bupropion SR); Nicotine Replacement (gum; lozenges; patches; etc.)  30 pack yr smoking hx: Educated regarding LDCT; To discuss with MD at next fup./ reviewed but declines A       This is a list of the screening recommended for you and due dates:  Health Maintenance  Topic Date Due  .  Hepatitis C: One time screening is recommended by Center for Disease Control  (CDC) for  adults born from [birth year] through 1965.   [birth year]  . Tetanus Vaccine  01/12/1965  . Flu Shot  04/06/2016  . Colon Cancer Screening  11/20/2018  . DEXA scan (bone density measurement)  Completed  . Shingles Vaccine  Completed  . Pneumonia vaccines  Completed   Health Maintenance, Female Adopting a healthy lifestyle and getting preventive care can go a long way to promote health and wellness. Talk with your health care provider about what schedule  of regular examinations is right for you. This is a good chance for you to check in with your provider about disease prevention and staying healthy. In between checkups, there are plenty of things you can do on your own. Experts have done a lot of research about which lifestyle changes and preventive measures are most likely to keep you healthy. Ask your health care provider for more information. WEIGHT AND DIET  Eat a healthy diet  Be sure to include plenty of vegetables, fruits, low-fat dairy products, and lean protein.  Do not eat a lot of foods high in solid fats, added sugars, or salt.  Get regular exercise. This is one of the most important things you can do for your health.  Most adults should exercise for at least 150 minutes each week. The exercise should increase your heart rate and make you sweat (moderate-intensity exercise).  Most adults should also do strengthening exercises at least twice a week. This is in addition to the moderate-intensity exercise.  Maintain a healthy weight  Body mass index (BMI) is a measurement that can be used to identify possible weight problems. It estimates body fat based on height and weight. Your health care provider can help determine your BMI and help you achieve or maintain a healthy weight.  For females 21 years of age and older:   A BMI below 18.5 is considered underweight.  A BMI of  18.5 to 24.9 is normal.  A BMI of 25 to 29.9 is considered overweight.  A BMI of 30 and above is considered obese.  Watch levels of cholesterol and blood lipids  You should start having your blood tested for lipids and cholesterol at 70 years of age, then have this test every 5 years.  You may need to have your cholesterol levels checked more often if:  Your lipid or cholesterol levels are high.  You are older than 70 years of age.  You are at high risk for heart disease.  CANCER SCREENING   Lung Cancer  Lung cancer screening is recommended  for adults 52-7 years old who are at high risk for lung cancer because of a history of smoking.  A yearly low-dose CT scan of the lungs is recommended for people who:  Currently smoke.  Have quit within the past 15 years.  Have at least a 30-pack-year history of smoking. A pack year is smoking an average of one pack of cigarettes a day for 1 year.  Yearly screening should continue until it has been 15 years since you quit.  Yearly screening should stop if you develop a health problem that would prevent you from having lung cancer treatment.  Breast Cancer  Practice breast self-awareness. This means understanding how your breasts normally appear and feel.  It also means doing regular breast self-exams. Let your health care provider know about any changes, no matter how small.  If you are in your 20s or 30s, you should have a clinical breast exam (CBE) by a health care provider every 1-3 years as part of a regular health exam.  If you are 39 or older, have a CBE every year. Also consider having a breast X-ray (mammogram) every year.  If you have a family history of breast cancer, talk to your health care provider about genetic screening.  If you are at high risk for breast cancer, talk to your health care provider about having an MRI and a mammogram every year.  Breast cancer gene (BRCA) assessment is recommended for women who have family members with BRCA-related cancers. BRCA-related cancers include:  Breast.  Ovarian.  Tubal.  Peritoneal cancers.  Results of the assessment will determine the need for genetic counseling and BRCA1 and BRCA2 testing. Cervical Cancer Your health care provider may recommend that you be screened regularly for cancer of the pelvic organs (ovaries, uterus, and vagina). This screening involves a pelvic examination, including checking for microscopic changes to the surface of your cervix (Pap test). You may be encouraged to have this screening done  every 3 years, beginning at age 77.  For women ages 5-65, health care providers may recommend pelvic exams and Pap testing every 3 years, or they may recommend the Pap and pelvic exam, combined with testing for human papilloma virus (HPV), every 5 years. Some types of HPV increase your risk of cervical cancer. Testing for HPV may also be done on women of any age with unclear Pap test results.  Other health care providers may not recommend any screening for nonpregnant women who are considered low risk for pelvic cancer and who do not have symptoms. Ask your health care provider if a screening pelvic exam is right for you.  If you have had past treatment for cervical cancer or a condition that could lead to cancer, you need Pap tests and screening for cancer for at least 20 years after your treatment. If Pap tests have been discontinued,  your risk factors (such as having a new sexual partner) need to be reassessed to determine if screening should resume. Some women have medical problems that increase the chance of getting cervical cancer. In these cases, your health care provider may recommend more frequent screening and Pap tests. Colorectal Cancer  This type of cancer can be detected and often prevented.  Routine colorectal cancer screening usually begins at 70 years of age and continues through 70 years of age.  Your health care provider may recommend screening at an earlier age if you have risk factors for colon cancer.  Your health care provider may also recommend using home test kits to check for hidden blood in the stool.  A small camera at the end of a tube can be used to examine your colon directly (sigmoidoscopy or colonoscopy). This is done to check for the earliest forms of colorectal cancer.  Routine screening usually begins at age 76.  Direct examination of the colon should be repeated every 5-10 years through 70 years of age. However, you may need to be screened more often if  early forms of precancerous polyps or small growths are found. Skin Cancer  Check your skin from head to toe regularly.  Tell your health care provider about any new moles or changes in moles, especially if there is a change in a mole's shape or color.  Also tell your health care provider if you have a mole that is larger than the size of a pencil eraser.  Always use sunscreen. Apply sunscreen liberally and repeatedly throughout the day.  Protect yourself by wearing long sleeves, pants, a wide-brimmed hat, and sunglasses whenever you are outside. HEART DISEASE, DIABETES, AND HIGH BLOOD PRESSURE   High blood pressure causes heart disease and increases the risk of stroke. High blood pressure is more likely to develop in:  People who have blood pressure in the high end of the normal range (130-139/85-89 mm Hg).  People who are overweight or obese.  People who are African American.  If you are 43-50 years of age, have your blood pressure checked every 3-5 years. If you are 16 years of age or older, have your blood pressure checked every year. You should have your blood pressure measured twice--once when you are at a hospital or clinic, and once when you are not at a hospital or clinic. Record the average of the two measurements. To check your blood pressure when you are not at a hospital or clinic, you can use:  An automated blood pressure machine at a pharmacy.  A home blood pressure monitor.  If you are between 42 years and 97 years old, ask your health care provider if you should take aspirin to prevent strokes.  Have regular diabetes screenings. This involves taking a blood sample to check your fasting blood sugar level.  If you are at a normal weight and have a low risk for diabetes, have this test once every three years after 70 years of age.  If you are overweight and have a high risk for diabetes, consider being tested at a younger age or more often. PREVENTING INFECTION   Hepatitis B  If you have a higher risk for hepatitis B, you should be screened for this virus. You are considered at high risk for hepatitis B if:  You were born in a country where hepatitis B is common. Ask your health care provider which countries are considered high risk.  Your parents were born in a high-risk  country, and you have not been immunized against hepatitis B (hepatitis B vaccine).  You have HIV or AIDS.  You use needles to inject street drugs.  You live with someone who has hepatitis B.  You have had sex with someone who has hepatitis B.  You get hemodialysis treatment.  You take certain medicines for conditions, including cancer, organ transplantation, and autoimmune conditions. Hepatitis C  Blood testing is recommended for:  Everyone born from [birth year] through 1965.  Anyone with known risk factors for hepatitis C. Sexually transmitted infections (STIs)  You should be screened for sexually transmitted infections (STIs) including gonorrhea and chlamydia if:  You are sexually active and are younger than 70 years of age.  You are older than 70 years of age and your health care provider tells you that you are at risk for this type of infection.  Your sexual activity has changed since you were last screened and you are at an increased risk for chlamydia or gonorrhea. Ask your health care provider if you are at risk.  If you do not have HIV, but are at risk, it may be recommended that you take a prescription medicine daily to prevent HIV infection. This is called pre-exposure prophylaxis (PrEP). You are considered at risk if:  You are sexually active and do not regularly use condoms or know the HIV status of your partner(s).  You take drugs by injection.  You are sexually active with a partner who has HIV. Talk with your health care provider about whether you are at high risk of being infected with HIV. If you choose to begin PrEP, you should first be tested for  HIV. You should then be tested every 3 months for as long as you are taking PrEP.  PREGNANCY   If you are premenopausal and you may become pregnant, ask your health care provider about preconception counseling.  If you may become pregnant, take 400 to 800 micrograms (mcg) of folic acid every day.  If you want to prevent pregnancy, talk to your health care provider about birth control (contraception). OSTEOPOROSIS AND MENOPAUSE   Osteoporosis is a disease in which the bones lose minerals and strength with aging. This can result in serious bone fractures. Your risk for osteoporosis can be identified using a bone density scan.  If you are 66 years of age or older, or if you are at risk for osteoporosis and fractures, ask your health care provider if you should be screened.  Ask your health care provider whether you should take a calcium or vitamin D supplement to lower your risk for osteoporosis.  Menopause may have certain physical symptoms and risks.  Hormone replacement therapy may reduce some of these symptoms and risks. Talk to your health care provider about whether hormone replacement therapy is right for you.  HOME CARE INSTRUCTIONS   Schedule regular health, dental, and eye exams.  Stay current with your immunizations.   Do not use any tobacco products including cigarettes, chewing tobacco, or electronic cigarettes.  If you are pregnant, do not drink alcohol.  If you are breastfeeding, limit how much and how often you drink alcohol.  Limit alcohol intake to no more than 1 drink per day for nonpregnant women. One drink equals 12 ounces of beer, 5 ounces of wine, or 1 ounces of hard liquor.  Do not use street drugs.  Do not share needles.  Ask your health care provider for help if you need support or information about quitting drugs.  Tell your health care provider if you often feel depressed.  Tell your health care provider if you have ever been abused or do not feel  safe at home.   This information is not intended to replace advice given to you by your health care provider. Make sure you discuss any questions you have with your health care provider.   Document Released: 03/08/2011 Document Revised: 09/13/2014 Document Reviewed: 07/25/2013 Elsevier Interactive Patient Education [employer].
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
## 2016-02-28 NOTE — Assessment & Plan Note (Signed)
stable overall by history and exam, recent data reviewed with pt, and pt to continue medical treatment as before,  to f/u any worsening symptoms or concerns Lab Results  Component Value Date   LDLCALC 111* 02/24/2016

## 2016-02-28 NOTE — Assessment & Plan Note (Signed)
?   Depression related vs other - declines pulm referral for r/o osa, will try to arrange home ONO to r/o nocturnal hypoxia

## 2016-02-28 NOTE — Assessment & Plan Note (Addendum)
Mod to severe, denies SI or HI, declines counseling or psychiatric referral, for lexapro 10 qd,  to f/u any worsening symptoms or concerns  In addition to the time spent performing CPE, I spent an additional 25 minutes face to face,in which greater than 50% of this time was spent in counseling and coordination of care for patient's acute illness as documented.

## 2016-02-28 NOTE — Assessment & Plan Note (Signed)
persistent, pt declines urology evaluation

## 2016-02-28 NOTE — Assessment & Plan Note (Signed)

## 2016-02-28 NOTE — Assessment & Plan Note (Signed)
Mild, asympt, ? Sign of osa,  to f/u any worsening symptoms or concerns

## 2016-05-17 ENCOUNTER — Telehealth: Payer: Self-pay | Admitting: *Deleted

## 2016-05-17 NOTE — Telephone Encounter (Signed)
Pt left msg on triage stating her refills has expired on her Lorazepam. Requesting md to [facility]. MD out of office today will hold for his return tomorrow.../lmb...Raechel Chute/lmb

## 2016-05-18 ENCOUNTER — Other Ambulatory Visit: Payer: Self-pay | Admitting: Internal Medicine

## 2016-05-18 MED ORDER — LORAZEPAM 0.5 MG PO TABS: 0.5000 mg | ORAL_TABLET | Freq: Two times a day (BID) | ORAL | 5 refills | Status: DC | PRN

## 2016-05-18 NOTE — Telephone Encounter (Signed)
Pt called to check up on this request. Please call pt °

## 2016-05-18 NOTE — Telephone Encounter (Signed)
Done hardcopy to Corinne  

## 2016-05-19 NOTE — Telephone Encounter (Signed)
faxed

## 2016-06-18 DIAGNOSIS — W540XXA Bitten by dog, initial encounter: Secondary | ICD-10-CM | POA: Diagnosis not present

## 2016-06-18 DIAGNOSIS — M79609 Pain in unspecified limb: Secondary | ICD-10-CM | POA: Diagnosis not present

## 2016-06-18 DIAGNOSIS — L03119 Cellulitis of unspecified part of limb: Secondary | ICD-10-CM | POA: Diagnosis not present

## 2016-08-26 ENCOUNTER — Encounter: Payer: Self-pay | Admitting: Internal Medicine

## 2016-08-26 ENCOUNTER — Ambulatory Visit (INDEPENDENT_AMBULATORY_CARE_PROVIDER_SITE_OTHER): Payer: Medicare HMO | Admitting: Internal Medicine

## 2016-08-26 VITALS — BP 130/80 | HR 89 | Resp 20 | Wt 132.0 lb

## 2016-08-26 DIAGNOSIS — F17200 Nicotine dependence, unspecified, uncomplicated: Secondary | ICD-10-CM | POA: Diagnosis not present

## 2016-08-26 DIAGNOSIS — F32A Depression, unspecified: Secondary | ICD-10-CM

## 2016-08-26 DIAGNOSIS — F419 Anxiety disorder, unspecified: Secondary | ICD-10-CM

## 2016-08-26 DIAGNOSIS — F418 Other specified anxiety disorders: Secondary | ICD-10-CM

## 2016-08-26 DIAGNOSIS — J441 Chronic obstructive pulmonary disease with (acute) exacerbation: Secondary | ICD-10-CM | POA: Diagnosis not present

## 2016-08-26 DIAGNOSIS — J449 Chronic obstructive pulmonary disease, unspecified: Secondary | ICD-10-CM | POA: Insufficient documentation

## 2016-08-26 DIAGNOSIS — R69 Illness, unspecified: Secondary | ICD-10-CM | POA: Diagnosis not present

## 2016-08-26 DIAGNOSIS — F329 Major depressive disorder, single episode, unspecified: Secondary | ICD-10-CM

## 2016-08-26 MED ORDER — VARENICLINE TARTRATE 0.5 MG X 11 & 1 MG X 42 PO MISC: ORAL | 0 refills | Status: DC

## 2016-08-26 MED ORDER — PREDNISONE 10 MG PO TABS: 10.0000 mg | ORAL_TABLET | Freq: Every day | ORAL | 0 refills | Status: AC

## 2016-08-26 MED ORDER — TIOTROPIUM BROMIDE MONOHYDRATE 18 MCG IN CAPS: 18.0000 ug | ORAL_CAPSULE | Freq: Every day | RESPIRATORY_TRACT | 12 refills | Status: DC

## 2016-08-26 MED ORDER — VARENICLINE TARTRATE 1 MG PO TABS: 1.0000 mg | ORAL_TABLET | Freq: Two times a day (BID) | ORAL | 1 refills | Status: DC

## 2016-08-26 MED ORDER — ALBUTEROL SULFATE HFA 108 (90 BASE) MCG/ACT IN AERS: 2.0000 | INHALATION_SPRAY | Freq: Four times a day (QID) | RESPIRATORY_TRACT | 2 refills | Status: DC | PRN

## 2016-08-26 MED ORDER — PREDNISONE 10 MG PO TABS: 10.0000 mg | ORAL_TABLET | Freq: Every day | ORAL | 0 refills | Status: DC

## 2016-08-26 MED ORDER — ESCITALOPRAM OXALATE 10 MG PO TABS: ORAL_TABLET | ORAL | 3 refills | Status: DC
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
## 2016-08-26 NOTE — Patient Instructions (Signed)
You had the steroid shot today  Please take all new medication as prescribed - the inhalers, the chantix and the increased lexapro  Please continue all other medications as before, and refills have been done if requested.  Please have the pharmacy call with any other refills you may need.  Please continue your efforts at being more active, low cholesterol diet, and weight control.  Please keep your appointments with your specialists as you may have planned  Please return in 6 months, or sooner if needed, with Lab testing done 3-5 days before

## 2016-08-26 NOTE — Progress Notes (Signed)
Pre visit review using our clinic review tool, if applicable. No additional management support is needed unless otherwise documented below in the visit note. 

## 2016-08-28 NOTE — Assessment & Plan Note (Signed)
Ok for chantix asd,  to f/u any worsening symptoms or concerns 

## 2016-08-28 NOTE — Assessment & Plan Note (Signed)
Mild to mod, for increased lexapro 15 qd, declines counseling referral,  to f/u any worsening symptoms or concerns

## 2016-08-28 NOTE — Assessment & Plan Note (Signed)
Mild to mod, for depomedrol 80 IM, predpac asd,  to f/u any worsening symptoms or concerns 

## 2016-12-31 ENCOUNTER — Other Ambulatory Visit: Payer: Self-pay | Admitting: Internal Medicine

## 2016-12-31 ENCOUNTER — Other Ambulatory Visit: Payer: Self-pay

## 2016-12-31 MED ORDER — LORAZEPAM 0.5 MG PO TABS: ORAL_TABLET | ORAL | 3 refills | Status: DC

## 2016-12-31 NOTE — Telephone Encounter (Signed)
Done hardcopy to Shirron  

## 2016-12-31 NOTE — Telephone Encounter (Signed)
faxed

## 2016-12-31 NOTE — Telephone Encounter (Signed)
Pharmacy states that patient has no refills remaining. Please advise.

## 2017-02-28 ENCOUNTER — Other Ambulatory Visit: Payer: Medicare HMO

## 2017-02-28 DIAGNOSIS — Z1159 Encounter for screening for other viral diseases: Secondary | ICD-10-CM

## 2017-03-01 LAB — HEPATITIS C ANTIBODY: HCV AB: NEGATIVE

## 2017-03-03 ENCOUNTER — Telehealth: Payer: Self-pay

## 2017-03-03 ENCOUNTER — Ambulatory Visit (INDEPENDENT_AMBULATORY_CARE_PROVIDER_SITE_OTHER): Payer: Medicare HMO | Admitting: Internal Medicine

## 2017-03-03 ENCOUNTER — Other Ambulatory Visit (INDEPENDENT_AMBULATORY_CARE_PROVIDER_SITE_OTHER): Payer: Medicare HMO

## 2017-03-03 ENCOUNTER — Encounter: Payer: Self-pay | Admitting: Internal Medicine

## 2017-03-03 ENCOUNTER — Other Ambulatory Visit: Payer: Self-pay | Admitting: Internal Medicine

## 2017-03-03 VITALS — BP 152/88 | HR 81 | Ht 62.0 in | Wt 143.0 lb

## 2017-03-03 DIAGNOSIS — R5383 Other fatigue: Secondary | ICD-10-CM

## 2017-03-03 DIAGNOSIS — F17200 Nicotine dependence, unspecified, uncomplicated: Secondary | ICD-10-CM

## 2017-03-03 DIAGNOSIS — Z0000 Encounter for general adult medical examination without abnormal findings: Secondary | ICD-10-CM | POA: Diagnosis not present

## 2017-03-03 DIAGNOSIS — R3129 Other microscopic hematuria: Secondary | ICD-10-CM

## 2017-03-03 DIAGNOSIS — R69 Illness, unspecified: Secondary | ICD-10-CM | POA: Diagnosis not present

## 2017-03-03 DIAGNOSIS — J449 Chronic obstructive pulmonary disease, unspecified: Secondary | ICD-10-CM | POA: Diagnosis not present

## 2017-03-03 DIAGNOSIS — F419 Anxiety disorder, unspecified: Secondary | ICD-10-CM

## 2017-03-03 DIAGNOSIS — G4710 Hypersomnia, unspecified: Secondary | ICD-10-CM | POA: Diagnosis not present

## 2017-03-03 DIAGNOSIS — F329 Major depressive disorder, single episode, unspecified: Secondary | ICD-10-CM

## 2017-03-03 DIAGNOSIS — E559 Vitamin D deficiency, unspecified: Secondary | ICD-10-CM | POA: Diagnosis not present

## 2017-03-03 DIAGNOSIS — F32A Depression, unspecified: Secondary | ICD-10-CM

## 2017-03-03 DIAGNOSIS — Z0001 Encounter for general adult medical examination with abnormal findings: Secondary | ICD-10-CM

## 2017-03-03 LAB — HEPATIC FUNCTION PANEL
ALBUMIN: 4.5 g/dL (ref 3.5–5.2)
ALK PHOS: 59 U/L (ref 39–117)
ALT: 12 U/L (ref 0–35)
AST: 12 U/L (ref 0–37)
BILIRUBIN TOTAL: 0.5 mg/dL (ref 0.2–1.2)
Bilirubin, Direct: 0 mg/dL (ref 0.0–0.3)
Total Protein: 7 g/dL (ref 6.0–8.3)

## 2017-03-03 LAB — URINALYSIS, ROUTINE W REFLEX MICROSCOPIC
Bilirubin Urine: NEGATIVE
Ketones, ur: NEGATIVE
Leukocytes, UA: NEGATIVE
NITRITE: NEGATIVE
PH: 7.5 (ref 5.0–8.0)
RBC / HPF: NONE SEEN (ref 0–?)
SPECIFIC GRAVITY, URINE: 1.01 (ref 1.000–1.030)
TOTAL PROTEIN, URINE-UPE24: NEGATIVE
URINE GLUCOSE: NEGATIVE
Urobilinogen, UA: 0.2 (ref 0.0–1.0)
WBC, UA: NONE SEEN (ref 0–?)

## 2017-03-03 LAB — CBC WITH DIFFERENTIAL/PLATELET
BASOS PCT: 0.7 % (ref 0.0–3.0)
Basophils Absolute: 0 10*3/uL (ref 0.0–0.1)
EOS ABS: 0.1 10*3/uL (ref 0.0–0.7)
Eosinophils Relative: 1.1 % (ref 0.0–5.0)
HCT: 50 % — ABNORMAL HIGH (ref 36.0–46.0)
HEMOGLOBIN: 16.8 g/dL — AB (ref 12.0–15.0)
LYMPHS ABS: 1.1 10*3/uL (ref 0.7–4.0)
Lymphocytes Relative: 20.4 % (ref 12.0–46.0)
MCHC: 33.6 g/dL (ref 30.0–36.0)
MCV: 95.9 fl (ref 78.0–100.0)
MONO ABS: 0.4 10*3/uL (ref 0.1–1.0)
Monocytes Relative: 6.8 % (ref 3.0–12.0)
NEUTROS PCT: 71 % (ref 43.0–77.0)
Neutro Abs: 3.9 10*3/uL (ref 1.4–7.7)
Platelets: 212 10*3/uL (ref 150.0–400.0)
RBC: 5.21 Mil/uL — ABNORMAL HIGH (ref 3.87–5.11)
RDW: 12.7 % (ref 11.5–15.5)
WBC: 5.4 10*3/uL (ref 4.0–10.5)

## 2017-03-03 LAB — BASIC METABOLIC PANEL
BUN: 9 mg/dL (ref 6–23)
CHLORIDE: 97 meq/L (ref 96–112)
CO2: 33 mEq/L — ABNORMAL HIGH (ref 19–32)
Calcium: 9.8 mg/dL (ref 8.4–10.5)
Creatinine, Ser: 0.64 mg/dL (ref 0.40–1.20)
GFR: 97.19 mL/min (ref >60.00)
GLUCOSE: 91 mg/dL (ref 70–99)
POTASSIUM: 4.4 meq/L (ref 3.5–5.1)
SODIUM: 137 meq/L (ref 135–145)

## 2017-03-03 LAB — LIPID PANEL
CHOLESTEROL: 253 mg/dL — AB (ref 0–200)
HDL: 92.1 mg/dL (ref >39.00)
LDL Cholesterol: 138 mg/dL — ABNORMAL HIGH (ref 0–99)
NonHDL: 160.55
TRIGLYCERIDES: 115 mg/dL (ref 0.0–149.0)
Total CHOL/HDL Ratio: 3
VLDL: 23 mg/dL (ref 0.0–40.0)

## 2017-03-03 LAB — VITAMIN B12: Vitamin B-12: >1500 pg/mL — ABNORMAL HIGH (ref 211–911)

## 2017-03-03 LAB — IBC PANEL
Iron: 138 ug/dL (ref 42–145)
SATURATION RATIOS: 31.7 % (ref 20.0–50.0)
Transferrin: 311 mg/dL (ref 212.0–360.0)

## 2017-03-03 LAB — TSH: TSH: 0.7 u[IU]/mL (ref 0.35–4.50)

## 2017-03-03 LAB — VITAMIN D 25 HYDROXY (VIT D DEFICIENCY, FRACTURES): VITD: 17.49 ng/mL — AB (ref 30.00–100.00)

## 2017-03-03 MED ORDER — FLUOXETINE HCL 20 MG PO CAPS: 20.0000 mg | ORAL_CAPSULE | Freq: Every day | ORAL | 3 refills | Status: DC

## 2017-03-03 MED ORDER — ALBUTEROL SULFATE HFA 108 (90 BASE) MCG/ACT IN AERS: 2.0000 | INHALATION_SPRAY | Freq: Four times a day (QID) | RESPIRATORY_TRACT | 3 refills | Status: DC | PRN

## 2017-03-03 MED ORDER — VITAMIN D (ERGOCALCIFEROL) 1.25 MG (50000 UNIT) PO CAPS: 50000.0000 [IU] | ORAL_CAPSULE | ORAL | 0 refills | Status: DC

## 2017-03-03 MED ORDER — LORAZEPAM 0.5 MG PO TABS: ORAL_TABLET | ORAL | 3 refills | Status: DC

## 2017-03-03 MED ORDER — FLUOXETINE HCL 20 MG PO TABS: 20.0000 mg | ORAL_TABLET | Freq: Every day | ORAL | 3 refills | Status: DC

## 2017-03-03 NOTE — Telephone Encounter (Signed)
-----   Message from Corwin LevinsJames W [name], MD sent at 03/03/2017  1:05 PM EDT ----- Letter sent, cont same tx except  The test results show that your current treatment is OK, except the Vitamin D level is low again.  We should treat with a prescription for high dose once weekly Vit D tablets for 12 weeks, and then after this, please start Vit D OTC 2000 units per day.  A prescription for the high dose will be sent.  You should be called from the office as well.  Shirron to please inform pt, I will do rx

## 2017-03-03 NOTE — Patient Instructions (Signed)
OK to stop the lexapro  Please take all new medication as prescribed - the prozac 20 mg per day (which can be increased in 3 - 4 wks if needed)  You can also take OTC Mucinex (or it's generic off brand) for congestion, and tylenol as needed for pain.  Please check your Blood Pressures at home on a regular basis, as the goal is to be less than 140/90  Please continue all other medications as before, and refills have been done if requested.  Please have the pharmacy call with any other refills you may need.  Please continue your efforts at being more active, low cholesterol diet, and weight control.  You are otherwise up to date with prevention measures today.  Please keep your appointments with your specialists as you may have planned  You will be contacted regarding the referral for: Lung Cancer screening, and for possible Sleep apnea (both at Pulmonary)
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
## 2017-03-03 NOTE — Telephone Encounter (Signed)
Pt has been informed and expressed understanding.  

## 2017-03-03 NOTE — Telephone Encounter (Signed)
Pt has been informed and expressed understanding. Does she need to come back in 12 weeks to have the lab redone? If not when will she be tested again to see if her levels are stable?

## 2017-03-03 NOTE — Telephone Encounter (Signed)
It can be repeated after 3 - 6 mo but usually not necessary

## 2017-03-06 NOTE — Assessment & Plan Note (Addendum)
Mod uncontrolled, declines counseling or psychiatry referral, for change lexapro to prozac 20 qd,  to f/u any worsening symptoms or concerns  In addition to the time spent performing CPE, I spent an additional 25 minutes face to face,in which greater than 50% of this time was spent in counseling and coordination of care for patient's acute illness as documented, including the differential dx, eval and tx of anxiety, depression, copd, daytime somnolence, fatigue, microhematuria and chronic smoking

## 2017-03-06 NOTE — Assessment & Plan Note (Signed)

## 2017-03-06 NOTE — Assessment & Plan Note (Signed)
Also for f/u level,  to f/u any worsening symptoms or concerns

## 2017-03-06 NOTE — Assessment & Plan Note (Signed)
stable overall by history and exam, and pt to continue medical treatment as before,  to f/u any worsening symptoms or concerns;e  

## 2017-03-06 NOTE — Assessment & Plan Note (Signed)
Cant r/o osa - for pulm referral 

## 2017-03-06 NOTE — Assessment & Plan Note (Signed)
No plans to quit, for pulm LDCT referral

## 2017-03-06 NOTE — Assessment & Plan Note (Addendum)
Declines further evaluation at this time, for f/u UA today

## 2017-03-06 NOTE — Assessment & Plan Note (Addendum)
D/w pt, most likely related to depression, for labs as documented,  to f/u any worsening symptoms or concerns

## 2017-03-08 ENCOUNTER — Other Ambulatory Visit: Payer: Self-pay | Admitting: Acute Care

## 2017-03-08 DIAGNOSIS — F17210 Nicotine dependence, cigarettes, uncomplicated: Secondary | ICD-10-CM

## 2017-03-11 ENCOUNTER — Telehealth: Payer: Self-pay

## 2017-03-11 DIAGNOSIS — Z719 Counseling, unspecified: Secondary | ICD-10-CM

## 2017-03-11 NOTE — Telephone Encounter (Signed)
-----   Message from Lysle RubensLori D Bowers sent at 03/11/2017  9:41 AM EDT ----- I sent a referral msg to you asking if you can put a referral in for a sleep consult so I can attach it to her appt with Dr. Craige CottaSood upstairs.  Thanks Lawson FiscalLori

## 2017-03-17 ENCOUNTER — Other Ambulatory Visit: Payer: Medicare HMO

## 2017-03-17 ENCOUNTER — Ambulatory Visit (INDEPENDENT_AMBULATORY_CARE_PROVIDER_SITE_OTHER): Payer: Medicare HMO | Admitting: Pulmonary Disease

## 2017-03-17 ENCOUNTER — Encounter: Payer: Self-pay | Admitting: Pulmonary Disease

## 2017-03-17 VITALS — BP 130/86 | HR 73 | Ht 62.0 in | Wt 143.0 lb

## 2017-03-17 DIAGNOSIS — J449 Chronic obstructive pulmonary disease, unspecified: Secondary | ICD-10-CM

## 2017-03-17 DIAGNOSIS — F17200 Nicotine dependence, unspecified, uncomplicated: Secondary | ICD-10-CM

## 2017-03-17 DIAGNOSIS — R69 Illness, unspecified: Secondary | ICD-10-CM | POA: Diagnosis not present

## 2017-03-17 DIAGNOSIS — J309 Allergic rhinitis, unspecified: Secondary | ICD-10-CM

## 2017-03-17 NOTE — Patient Instructions (Addendum)
   Call me if you notice any new breathing problems or have any questions before your next appointment.  You can continue using your Ventolin inhaler as needed for any coughing, wheezing, or shortness of breath.   Try calling the [place] Quit Hotline (1800-QUITNOW) to see if you can get your nicotine patches for free.  I want you to use a daily 21mg  Nicotine patch to start with then get some nicotine gum or lozenges to use when you have a craving for a cigarette. You may need to decrease to the 14mg  patches if you have any nausea or chest discomfort with the higher dose patch.  TESTS ORDERED: 1. Full pulmonary function testing before next appointment 2. Serum alpha-1 antitrypsin phenotype & RAST Panel today
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
## 2017-03-18 ENCOUNTER — Telehealth: Payer: Self-pay | Admitting: Acute Care

## 2017-03-18 LAB — RESPIRATORY ALLERGY PANEL REGION II W/ RFLX: ~~LOC~~
Allergen, A. alternata, m6: <0.1 kU/L
Allergen, C. Herbarum, M2: <0.1 kU/L
Allergen, Cedar tree, t12: <0.1 kU/L
Allergen, Comm Silver Birch, t9: <0.1 kU/L
Allergen, Cottonwood, t14: <0.1 kU/L
Allergen, D pternoyssinus,d7: 0.23 kU/L — ABNORMAL HIGH
Allergen, Mouse Urine Protein, e78: <0.1 kU/L
Allergen, Mulberry, t76: <0.1 kU/L
Allergen, Oak,t7: <0.1 kU/L
Allergen, P. notatum, m1: <0.1 kU/L
Aspergillus fumigatus, m3: <0.1 kU/L
Bermuda Grass: <0.1 kU/L
Box Elder IgE: <0.1 kU/L
Cat Dander: <0.1 kU/L
Cockroach: <0.1 kU/L
Common Ragweed: <0.1 kU/L
D. farinae: 0.18 kU/L — ABNORMAL HIGH
Dog Dander: <0.1 kU/L
Elm IgE: <0.1 kU/L
IgE (Immunoglobulin E), Serum: 12 kU/L
Johnson Grass: <0.1 kU/L
Pecan/Hickory Tree IgE: <0.1 kU/L
Rough Pigweed  IgE: <0.1 kU/L
Sheep Sorrel IgE: <0.1 kU/L
Timothy Grass: <0.1 kU/L

## 2017-03-21 ENCOUNTER — Inpatient Hospital Stay: Admission: RE | Admit: 2017-03-21 | Payer: Medicare HMO | Source: Ambulatory Visit

## 2017-03-21 ENCOUNTER — Encounter: Payer: Medicare HMO | Admitting: Acute Care

## 2017-03-21 NOTE — Telephone Encounter (Signed)
Will forward to the lung screening nodule.

## 2017-03-22 NOTE — Telephone Encounter (Signed)
Spoke to pt .  She would like to wait until August to rescheduled SDMV and Ct.  I advised pt that I will defer until 04/2017 and will call her back at that time to reschedule.  Pt verbalized understanding.  Nothing further needed.

## 2017-03-23 LAB — ALPHA-1 ANTITRYPSIN PHENOTYPE: A-1 Antitrypsin: 148 mg/dL (ref 83–199)

## 2017-04-21 ENCOUNTER — Ambulatory Visit (INDEPENDENT_AMBULATORY_CARE_PROVIDER_SITE_OTHER): Payer: Medicare HMO | Admitting: Pulmonary Disease

## 2017-04-21 DIAGNOSIS — J449 Chronic obstructive pulmonary disease, unspecified: Secondary | ICD-10-CM | POA: Diagnosis not present

## 2017-04-21 LAB — PULMONARY FUNCTION TEST
DL/VA % PRED: 100 %
DL/VA: 4.55 ml/min/mmHg/L
DLCO COR: 17.86 ml/min/mmHg
DLCO cor % pred: 82 %
DLCO unc % pred: 82 %
DLCO unc: 17.81 ml/min/mmHg
FEF 25-75 Post: 0.74 L/sec
FEF 25-75 Pre: 0.47 L/sec
FEF2575-%CHANGE-POST: 58 %
FEF2575-%PRED-PRE: 27 %
FEF2575-%Pred-Post: 42 %
FEV1-%CHANGE-POST: 15 %
FEV1-%PRED-PRE: 42 %
FEV1-%Pred-Post: 48 %
FEV1-POST: 1 L
FEV1-Pre: 0.86 L
FEV1FVC-%CHANGE-POST: -4 %
FEV1FVC-%Pred-Pre: 82 %
FEV6-%Change-Post: 21 %
FEV6-%Pred-Post: 64 %
FEV6-%Pred-Pre: 52 %
FEV6-POST: 1.66 L
FEV6-PRE: 1.37 L
FEV6FVC-%Change-Post: 0 %
FEV6FVC-%PRED-POST: 105 %
FEV6FVC-%PRED-PRE: 105 %
FVC-%CHANGE-POST: 21 %
FVC-%PRED-POST: 61 %
FVC-%Pred-Pre: 50 %
FVC-PRE: 1.37 L
FVC-Post: 1.66 L
PRE FEV1/FVC RATIO: 63 %
PRE FEV6/FVC RATIO: 100 %
Post FEV1/FVC ratio: 60 %
Post FEV6/FVC ratio: 100 %
RV % PRED: 155 %
RV: 3.28 L
TLC % PRED: 101 %
TLC: 4.83 L

## 2017-04-21 NOTE — Progress Notes (Signed)
PFT done today. 

## 2017-05-09 DIAGNOSIS — S63501A Unspecified sprain of right wrist, initial encounter: Secondary | ICD-10-CM | POA: Diagnosis not present

## 2017-05-17 ENCOUNTER — Ambulatory Visit: Payer: Self-pay

## 2017-05-17 ENCOUNTER — Encounter: Payer: Self-pay | Admitting: Sports Medicine

## 2017-05-17 ENCOUNTER — Ambulatory Visit (INDEPENDENT_AMBULATORY_CARE_PROVIDER_SITE_OTHER): Payer: Medicare HMO | Admitting: Sports Medicine

## 2017-05-17 VITALS — BP 130/78 | HR 91 | Ht 62.0 in | Wt 145.2 lb

## 2017-05-17 DIAGNOSIS — M25531 Pain in right wrist: Secondary | ICD-10-CM

## 2017-05-17 DIAGNOSIS — M654 Radial styloid tenosynovitis [de Quervain]: Secondary | ICD-10-CM | POA: Diagnosis not present

## 2017-05-17 NOTE — Procedures (Signed)
PROCEDURE NOTE -  ULTRASOUND GUIDEDINJECTION: Right first dorsal compartment Images were obtained and interpreted by myself, Gaspar BiddingMichael Nubia Ziesmer, DO  Images have been saved and stored to PACS system. Images obtained on: GE S7 Ultrasound machine  ULTRASOUND FINDINGS: Marked thickening  of the tendon with stenosis of the tendon sheath consistent with de Quervain's tenosynovitis  DESCRIPTION OF PROCEDURE:  The patient's clinical condition is marked by substantial pain and/or significant functional disability. Other conservative therapy has not provided relief, is contraindicated, or not appropriate. There is a reasonable likelihood that injection will significantly improve the patient's pain and/or functional impairment. After discussing the risks, benefits and expected outcomes of the injection and all questions were reviewed and answered, the patient wished to undergo the above named procedure. Verbal consent was obtained. The ultrasound was used to identify the target structure and adjacent neurovascular structures. The skin was then prepped in sterile fashion and the target structure was injected under direct visualization using sterile technique as below: PREP: Alcohol, Ethel Chloride APPROACH: direct inplane, single injection, 25g 1.5" needle INJECTATE: 1.5 cc total of a solution of 2cc 0.5% marcaine, 1cc 40mg  DepoMedrol ASPIRATE: N/A DRESSING: Band-Aid patient's own thumb spica splint  Post procedural instructions including recommending icing and warning signs for infection were reviewed. This procedure was well tolerated and there were no complications.   IMPRESSION: Succesful US Guided Injection

## 2017-05-17 NOTE — Patient Instructions (Signed)

## 2017-05-17 NOTE — Assessment & Plan Note (Signed)
Injection performed today.  Conservative measures with 2 additional weeks of bracing especially at nighttime recommended.  Follow-up in 6 weeks for clinical reevaluation to ensure clinical resolution.
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
## 2017-06-09 ENCOUNTER — Institutional Professional Consult (permissible substitution): Payer: Medicare HMO | Admitting: Pulmonary Disease

## 2017-06-16 ENCOUNTER — Ambulatory Visit (INDEPENDENT_AMBULATORY_CARE_PROVIDER_SITE_OTHER): Payer: Medicare HMO | Admitting: Pulmonary Disease

## 2017-06-16 ENCOUNTER — Encounter: Payer: Self-pay | Admitting: Pulmonary Disease

## 2017-06-16 VITALS — BP 128/78 | HR 90 | Ht 63.0 in | Wt 144.4 lb

## 2017-06-16 DIAGNOSIS — F17210 Nicotine dependence, cigarettes, uncomplicated: Secondary | ICD-10-CM

## 2017-06-16 DIAGNOSIS — R69 Illness, unspecified: Secondary | ICD-10-CM | POA: Diagnosis not present

## 2017-06-16 DIAGNOSIS — F17200 Nicotine dependence, unspecified, uncomplicated: Secondary | ICD-10-CM

## 2017-06-16 DIAGNOSIS — J449 Chronic obstructive pulmonary disease, unspecified: Secondary | ICD-10-CM

## 2017-06-16 DIAGNOSIS — J309 Allergic rhinitis, unspecified: Secondary | ICD-10-CM | POA: Diagnosis not present

## 2017-06-16 MED ORDER — UMECLIDINIUM-VILANTEROL 62.5-25 MCG/INH IN AEPB: 1.0000 | INHALATION_SPRAY | Freq: Every day | RESPIRATORY_TRACT | 6 refills | Status: DC

## 2017-06-16 MED ORDER — UMECLIDINIUM-VILANTEROL 62.5-25 MCG/INH IN AEPB: 1.0000 | INHALATION_SPRAY | Freq: Every day | RESPIRATORY_TRACT | 0 refills | Status: DC

## 2017-06-16 NOTE — Patient Instructions (Addendum)
   Continue trying to cut back and completely quit smoking.  Use the Anoro inhaler we are giving you by doing 1 inhalation once daily at the same time each day.  Call my office if you have any problems with the Anoro.  Let us know if you have any new breathing problems or questions before your next appointment.  We will see you back in 6 month or sooner if needed.  TESTS ORDERED: 1. Spirometry with bronchodilator challenge at next appointment
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
## 2017-06-30 ENCOUNTER — Ambulatory Visit: Payer: Medicare HMO | Admitting: Sports Medicine

## 2017-09-29 ENCOUNTER — Other Ambulatory Visit: Payer: Self-pay | Admitting: Internal Medicine

## 2017-09-29 NOTE — Telephone Encounter (Signed)
Done erx 

## 2017-12-15 ENCOUNTER — Ambulatory Visit: Payer: Medicare HMO | Admitting: Internal Medicine

## 2018-01-31 ENCOUNTER — Ambulatory Visit (INDEPENDENT_AMBULATORY_CARE_PROVIDER_SITE_OTHER): Payer: Medicare HMO | Admitting: Internal Medicine

## 2018-01-31 ENCOUNTER — Encounter: Payer: Self-pay | Admitting: Internal Medicine

## 2018-01-31 ENCOUNTER — Other Ambulatory Visit: Payer: Self-pay | Admitting: Internal Medicine

## 2018-01-31 ENCOUNTER — Ambulatory Visit (INDEPENDENT_AMBULATORY_CARE_PROVIDER_SITE_OTHER)
Admission: RE | Admit: 2018-01-31 | Discharge: 2018-01-31 | Disposition: A | Discharge status: Home / Self Care | Payer: Medicare HMO | Source: Ambulatory Visit | Attending: Internal Medicine | Admitting: Internal Medicine

## 2018-01-31 ENCOUNTER — Other Ambulatory Visit (INDEPENDENT_AMBULATORY_CARE_PROVIDER_SITE_OTHER): Payer: Medicare HMO

## 2018-01-31 VITALS — BP 142/88 | HR 81 | Temp 98.1°F | Ht 63.0 in | Wt 146.0 lb

## 2018-01-31 DIAGNOSIS — R42 Dizziness and giddiness: Secondary | ICD-10-CM | POA: Diagnosis not present

## 2018-01-31 DIAGNOSIS — R296 Repeated falls: Secondary | ICD-10-CM | POA: Insufficient documentation

## 2018-01-31 DIAGNOSIS — R05 Cough: Secondary | ICD-10-CM | POA: Diagnosis not present

## 2018-01-31 DIAGNOSIS — J309 Allergic rhinitis, unspecified: Secondary | ICD-10-CM | POA: Diagnosis not present

## 2018-01-31 DIAGNOSIS — J45909 Unspecified asthma, uncomplicated: Secondary | ICD-10-CM

## 2018-01-31 DIAGNOSIS — J449 Chronic obstructive pulmonary disease, unspecified: Secondary | ICD-10-CM | POA: Diagnosis not present

## 2018-01-31 DIAGNOSIS — R221 Localized swelling, mass and lump, neck: Secondary | ICD-10-CM | POA: Diagnosis not present

## 2018-01-31 DIAGNOSIS — E785 Hyperlipidemia, unspecified: Secondary | ICD-10-CM

## 2018-01-31 LAB — HEPATIC FUNCTION PANEL
ALK PHOS: 66 U/L (ref 39–117)
ALT: 10 U/L (ref 0–35)
AST: 12 U/L (ref 0–37)
Albumin: 4.3 g/dL (ref 3.5–5.2)
BILIRUBIN DIRECT: 0.1 mg/dL (ref 0.0–0.3)
TOTAL PROTEIN: 7.4 g/dL (ref 6.0–8.3)
Total Bilirubin: 0.7 mg/dL (ref 0.2–1.2)

## 2018-01-31 LAB — LIPID PANEL
CHOLESTEROL: 229 mg/dL — AB (ref 0–200)
HDL: 92.6 mg/dL (ref >39.00)
LDL Cholesterol: 121 mg/dL — ABNORMAL HIGH (ref 0–99)
NonHDL: 136.12
Total CHOL/HDL Ratio: 2
Triglycerides: 76 mg/dL (ref 0.0–149.0)
VLDL: 15.2 mg/dL (ref 0.0–40.0)

## 2018-01-31 LAB — CBC WITH DIFFERENTIAL/PLATELET
BASOS ABS: 0 10*3/uL (ref 0.0–0.1)
BASOS PCT: 0.6 % (ref 0.0–3.0)
EOS ABS: 0.1 10*3/uL (ref 0.0–0.7)
Eosinophils Relative: 1.2 % (ref 0.0–5.0)
HEMATOCRIT: 45.7 % (ref 36.0–46.0)
HEMOGLOBIN: 15.3 g/dL — AB (ref 12.0–15.0)
Lymphocytes Relative: 26.6 % (ref 12.0–46.0)
Lymphs Abs: 1.3 10*3/uL (ref 0.7–4.0)
MCHC: 33.6 g/dL (ref 30.0–36.0)
MCV: 96.9 fl (ref 78.0–100.0)
MONOS PCT: 8 % (ref 3.0–12.0)
Monocytes Absolute: 0.4 10*3/uL (ref 0.1–1.0)
Neutro Abs: 3.1 10*3/uL (ref 1.4–7.7)
Neutrophils Relative %: 63.6 % (ref 43.0–77.0)
Platelets: 293 10*3/uL (ref 150.0–400.0)
RBC: 4.71 Mil/uL (ref 3.87–5.11)
RDW: 12.7 % (ref 11.5–15.5)
WBC: 4.9 10*3/uL (ref 4.0–10.5)

## 2018-01-31 LAB — BASIC METABOLIC PANEL
BUN: 10 mg/dL (ref 6–23)
CALCIUM: 9.5 mg/dL (ref 8.4–10.5)
CO2: 33 mEq/L — ABNORMAL HIGH (ref 19–32)
CREATININE: 0.52 mg/dL (ref 0.40–1.20)
Chloride: 96 mEq/L (ref 96–112)
GFR: 123.18 mL/min (ref >60.00)
Glucose, Bld: 95 mg/dL (ref 70–99)
Potassium: 4.4 mEq/L (ref 3.5–5.1)
Sodium: 135 mEq/L (ref 135–145)

## 2018-01-31 LAB — URINALYSIS, ROUTINE W REFLEX MICROSCOPIC
BILIRUBIN URINE: NEGATIVE
Ketones, ur: NEGATIVE
Leukocytes, UA: NEGATIVE
NITRITE: NEGATIVE
Specific Gravity, Urine: 1.025 (ref 1.000–1.030)
TOTAL PROTEIN, URINE-UPE24: NEGATIVE
Urine Glucose: NEGATIVE
Urobilinogen, UA: 0.2 (ref 0.0–1.0)
pH: 6.5 (ref 5.0–8.0)

## 2018-01-31 LAB — TSH: TSH: 0.97 u[IU]/mL (ref 0.35–4.50)

## 2018-01-31 MED ORDER — METHYLPREDNISOLONE ACETATE 80 MG/ML IJ SUSP
80.0000 mg | Freq: Once | INTRAMUSCULAR | Status: AC
  Administered 2018-01-31: 80 mg via INTRAMUSCULAR

## 2018-01-31 MED ORDER — CETIRIZINE HCL 10 MG PO TABS: 10.0000 mg | ORAL_TABLET | Freq: Every day | ORAL | 11 refills | Status: DC

## 2018-01-31 MED ORDER — BUDESONIDE-FORMOTEROL FUMARATE 160-4.5 MCG/ACT IN AERO: 2.0000 | INHALATION_SPRAY | Freq: Two times a day (BID) | RESPIRATORY_TRACT | 3 refills | Status: DC

## 2018-01-31 MED ORDER — LEVOFLOXACIN 500 MG PO TABS: 500.0000 mg | ORAL_TABLET | Freq: Every day | ORAL | 0 refills | Status: AC

## 2018-01-31 MED ORDER — TRIAMCINOLONE ACETONIDE 55 MCG/ACT NA AERO: 2.0000 | INHALATION_SPRAY | Freq: Every day | NASAL | 12 refills | Status: DC

## 2018-01-31 MED ORDER — PREDNISONE 10 MG PO TABS: ORAL_TABLET | ORAL | 0 refills | Status: DC

## 2018-01-31 MED ORDER — ALBUTEROL SULFATE HFA 108 (90 BASE) MCG/ACT IN AERS: 2.0000 | INHALATION_SPRAY | Freq: Four times a day (QID) | RESPIRATORY_TRACT | 3 refills | Status: DC | PRN
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
## 2018-01-31 NOTE — Patient Instructions (Signed)
You had the steroid shot today  Please take all new medication as prescribed - the prednisone, symbicort, zyrtec, nasacort  You can also take Delsym OTC for cough, and/or Mucinex (or it's generic off brand) for congestion, and tylenol as needed for pain.  Your EKG was OK today  Please continue all other medications as before, and refills have been done if requested.  Please have the pharmacy call with any other refills you may need.  Please keep your appointments with your specialists as you may have planned  You will be contacted regarding the referral for: CT neck  Please go to the XRAY Department in the Basement (go straight as you get off the elevator) for the x-ray testing  Please go to the LAB in the Basement (turn left off the elevator) for the tests to be done today  You will be contacted by phone if any changes need to be made immediately.  Otherwise, you will receive a letter about your results with an explanation, but please check with MyChart first.  Please remember to sign up for MyChart if you have not done so, as this will be important to you in the future with finding out test results, communicating by private email, and scheduling acute appointments online when needed.

## 2018-02-01 ENCOUNTER — Telehealth: Payer: Self-pay

## 2018-02-01 NOTE — Telephone Encounter (Signed)
Pt has been informed and expressed understanding.  

## 2018-02-01 NOTE — Telephone Encounter (Signed)
-----   Message from Corwin Levins, MD sent at 01/31/2018  5:27 PM EDT ----- Left message on MyChart, pt to cont same tx except  The test results show that your current treatment is OK, except there appears to be early pneumonia.  We should prescribe an antibiotic now, I will send the prescription, and you should hear from the office as well. We will also need to repeat the xray at your next appt in July.     Shirron to please inform pt, I will do rx, and pt already has f/u appt for July 1

## 2018-02-02 NOTE — Assessment & Plan Note (Signed)
At least one element related to vestibular dysfxn, cant r/o slight low volume given illness, declines antivert refill, for labs as above,  to f/u any worsening symptoms or concerns

## 2018-02-02 NOTE — Assessment & Plan Note (Addendum)
Etiology unclear, ecg reviewed, for f/u lab as ordered today, gait ok, will hold on PT but ? If related to underlying pulm dz given exam today  Note:  Total time for pt hx, exam, review of record with pt in the room, determination of diagnoses and plan for further eval and tx is > 40 min, with over 50% spent in coordination and counseling of patient including the differential dx, tx, further evaluation and other management of recurrent falls, COPD exacerbation, allergic rhinitis, HLD, dizziness and right neck mass

## 2018-02-02 NOTE — Assessment & Plan Note (Signed)
Mild to mod, for steroid tx as above, add zyrtec and nasacort asd,  to f/u any worsening symptoms or concerns

## 2018-02-02 NOTE — Assessment & Plan Note (Signed)
stable overall by history and exam, and pt to continue medical treatment as before,  to f/u any worsening symptoms or concern, for f/u lab with next visit

## 2018-02-02 NOTE — Assessment & Plan Note (Signed)
?   Clinical significance - ? Fatty lipoma vs other - for neck ct, and cxr today

## 2018-02-02 NOTE — Assessment & Plan Note (Signed)
With exacerbation  - for depomedrol IM , predpac asd, refilled albuterol prn, added symbicort asd, for mucinex prn, and cxr as above

## 2018-02-14 ENCOUNTER — Ambulatory Visit (INDEPENDENT_AMBULATORY_CARE_PROVIDER_SITE_OTHER)
Admission: RE | Admit: 2018-02-14 | Discharge: 2018-02-14 | Disposition: A | Discharge status: Home / Self Care | Payer: Medicare HMO | Source: Ambulatory Visit | Attending: Internal Medicine | Admitting: Internal Medicine

## 2018-02-14 DIAGNOSIS — R221 Localized swelling, mass and lump, neck: Secondary | ICD-10-CM

## 2018-02-14 MED ORDER — IOPAMIDOL (ISOVUE-300) INJECTION 61%
75.0000 mL | Freq: Once | INTRAVENOUS | Status: AC | PRN
  Administered 2018-02-14: 75 mL via INTRAVENOUS

## 2018-03-06 ENCOUNTER — Encounter: Payer: Self-pay | Admitting: Internal Medicine

## 2018-03-06 ENCOUNTER — Ambulatory Visit (INDEPENDENT_AMBULATORY_CARE_PROVIDER_SITE_OTHER): Payer: Medicare HMO | Admitting: Internal Medicine

## 2018-03-06 VITALS — BP 128/76 | HR 100 | Temp 98.5°F | Ht 63.0 in | Wt 147.0 lb

## 2018-03-06 DIAGNOSIS — Z0000 Encounter for general adult medical examination without abnormal findings: Secondary | ICD-10-CM | POA: Diagnosis not present

## 2018-03-06 DIAGNOSIS — E785 Hyperlipidemia, unspecified: Secondary | ICD-10-CM

## 2018-03-06 DIAGNOSIS — R3129 Other microscopic hematuria: Secondary | ICD-10-CM

## 2018-03-06 NOTE — Assessment & Plan Note (Signed)
D/w pt, last urology eval x 2 yrs, declines further f/u

## 2018-03-06 NOTE — Assessment & Plan Note (Signed)
Mild elev LDL, declines statin for now, cont diet with goal < 100 LDL

## 2018-03-06 NOTE — Patient Instructions (Signed)
Please continue all other medications as before, and refills have been done if requested.  Please have the pharmacy call with any other refills you may need.  Please continue your efforts at being more active, low cholesterol diet, and weight control.  You are otherwise up to date with prevention measures today.  Please keep your appointments with your specialists as you may have planned  Please return in 1 year for your yearly visit, or sooner if needed, with Lab testing done 3-5 days before  

## 2018-03-06 NOTE — Assessment & Plan Note (Signed)
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
## 2018-05-01 ENCOUNTER — Ambulatory Visit: Payer: Medicare HMO | Admitting: Sports Medicine

## 2018-05-01 ENCOUNTER — Encounter: Payer: Self-pay | Admitting: Sports Medicine

## 2018-05-01 VITALS — BP 130/78 | HR 88 | Ht 63.0 in | Wt 145.2 lb

## 2018-05-01 DIAGNOSIS — M4726 Other spondylosis with radiculopathy, lumbar region: Secondary | ICD-10-CM | POA: Diagnosis not present

## 2018-05-01 DIAGNOSIS — R296 Repeated falls: Secondary | ICD-10-CM | POA: Diagnosis not present

## 2018-05-01 DIAGNOSIS — M5416 Radiculopathy, lumbar region: Secondary | ICD-10-CM

## 2018-05-01 MED ORDER — GABAPENTIN 100 MG PO CAPS: ORAL_CAPSULE | ORAL | 1 refills | Status: DC

## 2018-05-01 NOTE — Patient Instructions (Addendum)
Also check out [employer]"Foundation Training" which is a program developed by Dr. Myles LippsEric Goodman.   There are links to a couple of his YouTube Videos below and I would like to see performing one of his videos 5-6 days per week.    A good intro video is: "Independence from Pain 7-minute Video" - [email or web address]   Exercises that focus more on the neck are as below: Dr. Derrill KayGoodman with [employer] [facility] teaching neck and shoulder details Part 1 - [email or web address] Part 2 Dr. Derrill KayGoodman with [facility] quick routine to practice daily - https://[email or web address].[email or web address]  Do not try to attempt the entire video when first beginning.    Try breaking of each exercise that he goes into shorter segments.  Otherwise if they perform an exercise for 45 seconds, start with 15 seconds and rest and then resume when they begin the new activity.  If you work your way up to being able to do these videos without having to stop, I expect you will see significant improvements in your pain.  If you enjoy his videos and would like to find out more you can look on his website: [email or web address].  He has a workout streaming option as well as a DVD set available for purchase.  Amazon has the best price for his DVDs.     Please perform the exercise program that we have prepared for you and gone over in detail on a daily basis.  In addition to the handout you were provided you can access your program through: [email or web address]   Your unique program code is:  [email or web address]

## 2018-05-01 NOTE — Progress Notes (Signed)
PROCEDURE NOTE: THERAPEUTIC EXERCISES (97110) 15 minutes spent for Therapeutic exercises as below and as referenced in the AVS.  This included exercises focusing on stretching, strengthening, with significant focus on eccentric aspects.   Proper technique shown and discussed handout in great detail with ATC.  All questions were discussed and answered.   Long term goals include an improvement in range of motion, strength, endurance as well as avoiding reinjury. Frequency of visits is one time as determined during today's  office visit. Frequency of exercises to be performed is as per handout.  EXERCISES REVIEWED:  Goodman Exercises  Thoracic Mobility  Pelvic recruitment 
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
## 2018-05-05 ENCOUNTER — Telehealth: Payer: Self-pay | Admitting: Internal Medicine

## 2018-05-05 ENCOUNTER — Other Ambulatory Visit: Payer: Self-pay

## 2018-05-05 ENCOUNTER — Encounter: Payer: Self-pay | Admitting: Sports Medicine

## 2018-05-05 NOTE — Telephone Encounter (Signed)
Copied from CRM [id number]. Topic: Quick Communication - See Telephone Encounter >> May 05, 2018 11:04 AM Windy KalataMichael, Griffey Nicasio L, NT wrote:  patient is calling and requesting a call back from Dr. Berline Choughigby or his assistant give him a call in regards to gabapentin (NEURONTIN) 100 MG capsule that was prescribed on 05/01/18. She states she has not had any relief for leg or back pain.

## 2018-05-05 NOTE — Telephone Encounter (Signed)
Called pt and advised. Med list updated.  

## 2018-05-05 NOTE — Telephone Encounter (Signed)
Per Dr. Berline Choughigby, increase to Gabapentin 100 mg, 2 tablets, orally, BID.

## 2018-05-05 NOTE — Assessment & Plan Note (Signed)
Left greater than right radiculopathy symptoms bilateral.  Should benefit from gabapentin.

## 2018-05-05 NOTE — Telephone Encounter (Signed)
See note

## 2018-05-16 ENCOUNTER — Other Ambulatory Visit: Payer: Self-pay

## 2018-05-16 ENCOUNTER — Ambulatory Visit: Payer: Medicare HMO

## 2018-05-16 MED ORDER — GABAPENTIN 100 MG PO CAPS: ORAL_CAPSULE | ORAL | 1 refills | Status: DC

## 2018-05-16 NOTE — Telephone Encounter (Signed)
Routed to wrong office 

## 2018-05-16 NOTE — Telephone Encounter (Signed)
Called pt and left VM advising new rx has been sent to pharmacy.

## 2018-05-16 NOTE — Telephone Encounter (Signed)
See note

## 2018-05-16 NOTE — Telephone Encounter (Signed)
New Rx sent to pharmacy

## 2018-05-16 NOTE — Telephone Encounter (Signed)
Pt calling and states her pharmacy cannot refill the Gabapentin with the increase dosage (100mg , 2 tablets, BID) due to the pharmacy has not received that order. Pt would like to see if this can be sent today. She states she was in a lot of pain last night and was up to 5am this morning due to pain. Please advise. [facility] - [place], [place] - [address]. [phone] (Phone) [phone] (Fax)

## 2018-06-06 ENCOUNTER — Other Ambulatory Visit: Payer: Self-pay | Admitting: Internal Medicine

## 2018-06-12 ENCOUNTER — Ambulatory Visit: Payer: Medicare HMO | Admitting: Sports Medicine

## 2018-06-15 ENCOUNTER — Encounter: Payer: Self-pay | Admitting: Sports Medicine

## 2018-06-15 ENCOUNTER — Ambulatory Visit (INDEPENDENT_AMBULATORY_CARE_PROVIDER_SITE_OTHER): Payer: Medicare HMO | Admitting: Sports Medicine

## 2018-06-15 VITALS — BP 118/84 | HR 85 | Ht 63.0 in | Wt 143.0 lb

## 2018-06-15 DIAGNOSIS — R296 Repeated falls: Secondary | ICD-10-CM

## 2018-06-15 DIAGNOSIS — M4726 Other spondylosis with radiculopathy, lumbar region: Secondary | ICD-10-CM | POA: Diagnosis not present

## 2018-06-15 DIAGNOSIS — M5416 Radiculopathy, lumbar region: Secondary | ICD-10-CM

## 2018-06-15 DIAGNOSIS — M79604 Pain in right leg: Secondary | ICD-10-CM

## 2018-06-15 MED ORDER — GABAPENTIN 100 MG PO CAPS: ORAL_CAPSULE | ORAL | 1 refills | Status: DC
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
## 2018-06-16 ENCOUNTER — Encounter: Payer: Self-pay | Admitting: Sports Medicine

## 2018-08-10 ENCOUNTER — Ambulatory Visit: Payer: Medicare HMO | Admitting: Sports Medicine

## 2018-09-12 ENCOUNTER — Ambulatory Visit (INDEPENDENT_AMBULATORY_CARE_PROVIDER_SITE_OTHER): Payer: Medicare HMO | Admitting: Internal Medicine

## 2018-09-12 ENCOUNTER — Encounter: Payer: Self-pay | Admitting: Internal Medicine

## 2018-09-12 VITALS — BP 164/102 | HR 88 | Temp 98.2°F | Ht 63.0 in | Wt 143.0 lb

## 2018-09-12 DIAGNOSIS — J3489 Other specified disorders of nose and nasal sinuses: Secondary | ICD-10-CM | POA: Diagnosis not present

## 2018-09-12 DIAGNOSIS — J309 Allergic rhinitis, unspecified: Secondary | ICD-10-CM

## 2018-09-12 DIAGNOSIS — R030 Elevated blood-pressure reading, without diagnosis of hypertension: Secondary | ICD-10-CM | POA: Diagnosis not present

## 2018-09-12 DIAGNOSIS — J069 Acute upper respiratory infection, unspecified: Secondary | ICD-10-CM | POA: Diagnosis not present

## 2018-09-12 MED ORDER — AMOXICILLIN-POT CLAVULANATE 875-125 MG PO TABS: 1.0000 | ORAL_TABLET | Freq: Two times a day (BID) | ORAL | 0 refills | Status: DC

## 2018-09-12 NOTE — Patient Instructions (Signed)
Please take all new medication as prescribed - the antibiotic  Please also take OTC Allegra 180 mg daily and Nasacort as directed for possible allergies  Please call in 1-2 weeks if the left nasal obstruction is not resolved, to consider a CT scan of sinus  Please continue all other medications as before, and refills have been done if requested.  Please have the pharmacy call with any other refills you may need.  Please keep your appointments with your specialists as you may have planned
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
## 2018-09-15 ENCOUNTER — Encounter: Payer: Self-pay | Admitting: Internal Medicine

## 2018-09-15 DIAGNOSIS — J3489 Other specified disorders of nose and nasal sinuses: Secondary | ICD-10-CM | POA: Insufficient documentation

## 2018-09-15 DIAGNOSIS — R030 Elevated blood-pressure reading, without diagnosis of hypertension: Secondary | ICD-10-CM | POA: Insufficient documentation

## 2018-09-15 DIAGNOSIS — J069 Acute upper respiratory infection, unspecified: Secondary | ICD-10-CM | POA: Insufficient documentation

## 2018-09-15 NOTE — Assessment & Plan Note (Signed)
?   Acute vs other, pt for CT sinus if not improved with tx as above, consider ENT eval,  to f/u any worsening symptoms or concerns

## 2018-09-15 NOTE — Assessment & Plan Note (Signed)
Most likely reactive, ok to follow, check BP at home and next visit

## 2018-09-15 NOTE — Assessment & Plan Note (Signed)
Mild to mod, for antibx course,  to f/u any worsening symptoms or concerns 

## 2018-09-15 NOTE — Assessment & Plan Note (Signed)
Mild to mod, for OTC allegra and nasacort asd,  to f/u any worsening symptoms or concerns 

## 2018-10-02 ENCOUNTER — Ambulatory Visit: Payer: Self-pay | Admitting: *Deleted

## 2018-10-02 NOTE — Telephone Encounter (Signed)
Patient is calling to report she is not better today- she feels she was better yesterday. Patient refuses to go to UC- but she agrees to seeks help if she gets worse.Patient has been scheduled for tomorrow morning at office with PCP.  Reason for Disposition . [1] Longstanding difficulty breathing (e.g., CHF, COPD, emphysema) AND [2] WORSE than normal  Answer Assessment - Initial Assessment Questions 1. RESPIRATORY STATUS: "Describe your breathing?" (e.g., wheezing, shortness of breath, unable to speak, severe coughing)      SOB with exertion, wheezing, coughing 2. ONSET: "When did this breathing problem begin?"      3 weeks ago 3. PATTERN "Does the difficult breathing come and go, or has it been constant since it started?"      Constant- because she can not move without it occurring  4. SEVERITY: "How bad is your breathing?" (e.g., mild, moderate, severe)    - MILD: No SOB at rest, mild SOB with walking, speaks normally in sentences, can lay down, no retractions, pulse < 100.    - MODERATE: SOB at rest, SOB with minimal exertion and prefers to sit, cannot lie down flat, speaks in phrases, mild retractions, audible wheezing, pulse 100-120.    - SEVERE: Very SOB at rest, speaks in single words, struggling to breathe, sitting hunched forward, retractions, pulse > 120      moderate 5. RECURRENT SYMPTOM: "Have you had difficulty breathing before?" If so, ask: "When was the last time?" and "What happened that time?"      Never been this bad- patient has COPD 6. CARDIAC HISTORY: "Do you have any history of heart disease?" (e.g., heart attack, angina, bypass surgery, angioplasty)      High BP 7. LUNG HISTORY: "Do you have any history of lung disease?"  (e.g., pulmonary embolus, asthma, emphysema)     COPD 8. CAUSE: "What do you think is causing the breathing problem?"      COPD, congestion in chest/cough 9. OTHER SYMPTOMS: "Do you have any other symptoms? (e.g., dizziness, runny nose, cough, chest  pain, fever)     Sinus congestion,chills 10. PREGNANCY: "Is there any chance you are pregnant?" "When was your last menstrual period?"       n/a 11. TRAVEL: "Have you traveled out of the country in the last month?" (e.g., travel history, exposures)       no  Protocols used: BREATHING DIFFICULTY-A-AH

## 2018-10-02 NOTE — Telephone Encounter (Signed)
Noted.  Dr. [name] FYI 

## 2018-10-03 ENCOUNTER — Encounter (HOSPITAL_COMMUNITY): Payer: Self-pay | Admitting: Emergency Medicine

## 2018-10-03 ENCOUNTER — Other Ambulatory Visit: Payer: Self-pay

## 2018-10-03 ENCOUNTER — Emergency Department (HOSPITAL_COMMUNITY): Payer: Medicare HMO

## 2018-10-03 ENCOUNTER — Ambulatory Visit (INDEPENDENT_AMBULATORY_CARE_PROVIDER_SITE_OTHER): Payer: Medicare HMO | Admitting: Internal Medicine

## 2018-10-03 ENCOUNTER — Encounter: Payer: Self-pay | Admitting: Internal Medicine

## 2018-10-03 ENCOUNTER — Inpatient Hospital Stay (HOSPITAL_COMMUNITY)
Admission: EM | Admit: 2018-10-03 | Discharge: 2018-10-05 | DRG: 193 | Disposition: A | Discharge status: Home / Self Care | Payer: Medicare HMO | Attending: Family Medicine | Admitting: Family Medicine

## 2018-10-03 VITALS — BP 142/88 | HR 106 | Temp 98.5°F | Ht 63.0 in | Wt 141.0 lb

## 2018-10-03 DIAGNOSIS — R911 Solitary pulmonary nodule: Secondary | ICD-10-CM

## 2018-10-03 DIAGNOSIS — R030 Elevated blood-pressure reading, without diagnosis of hypertension: Secondary | ICD-10-CM | POA: Diagnosis not present

## 2018-10-03 DIAGNOSIS — J9601 Acute respiratory failure with hypoxia: Secondary | ICD-10-CM

## 2018-10-03 DIAGNOSIS — J069 Acute upper respiratory infection, unspecified: Secondary | ICD-10-CM | POA: Diagnosis not present

## 2018-10-03 DIAGNOSIS — F329 Major depressive disorder, single episode, unspecified: Secondary | ICD-10-CM | POA: Diagnosis present

## 2018-10-03 DIAGNOSIS — Z8249 Family history of ischemic heart disease and other diseases of the circulatory system: Secondary | ICD-10-CM

## 2018-10-03 DIAGNOSIS — Z820 Family history of epilepsy and other diseases of the nervous system: Secondary | ICD-10-CM

## 2018-10-03 DIAGNOSIS — F17210 Nicotine dependence, cigarettes, uncomplicated: Secondary | ICD-10-CM | POA: Diagnosis present

## 2018-10-03 DIAGNOSIS — R000 Tachycardia, unspecified: Secondary | ICD-10-CM | POA: Diagnosis not present

## 2018-10-03 DIAGNOSIS — R69 Illness, unspecified: Secondary | ICD-10-CM | POA: Diagnosis not present

## 2018-10-03 DIAGNOSIS — F419 Anxiety disorder, unspecified: Secondary | ICD-10-CM | POA: Diagnosis not present

## 2018-10-03 DIAGNOSIS — J9600 Acute respiratory failure, unspecified whether with hypoxia or hypercapnia: Secondary | ICD-10-CM | POA: Diagnosis present

## 2018-10-03 DIAGNOSIS — M479 Spondylosis, unspecified: Secondary | ICD-10-CM | POA: Diagnosis present

## 2018-10-03 DIAGNOSIS — Z87891 Personal history of nicotine dependence: Secondary | ICD-10-CM

## 2018-10-03 DIAGNOSIS — J189 Pneumonia, unspecified organism: Principal | ICD-10-CM | POA: Diagnosis present

## 2018-10-03 DIAGNOSIS — R0902 Hypoxemia: Secondary | ICD-10-CM

## 2018-10-03 DIAGNOSIS — F32A Depression, unspecified: Secondary | ICD-10-CM | POA: Diagnosis present

## 2018-10-03 DIAGNOSIS — R918 Other nonspecific abnormal finding of lung field: Secondary | ICD-10-CM | POA: Diagnosis not present

## 2018-10-03 DIAGNOSIS — Z833 Family history of diabetes mellitus: Secondary | ICD-10-CM

## 2018-10-03 DIAGNOSIS — J440 Chronic obstructive pulmonary disease with acute lower respiratory infection: Secondary | ICD-10-CM | POA: Diagnosis present

## 2018-10-03 DIAGNOSIS — E559 Vitamin D deficiency, unspecified: Secondary | ICD-10-CM | POA: Diagnosis present

## 2018-10-03 DIAGNOSIS — J441 Chronic obstructive pulmonary disease with (acute) exacerbation: Secondary | ICD-10-CM | POA: Diagnosis not present

## 2018-10-03 DIAGNOSIS — Z825 Family history of asthma and other chronic lower respiratory diseases: Secondary | ICD-10-CM

## 2018-10-03 DIAGNOSIS — D751 Secondary polycythemia: Secondary | ICD-10-CM | POA: Diagnosis present

## 2018-10-03 DIAGNOSIS — K219 Gastro-esophageal reflux disease without esophagitis: Secondary | ICD-10-CM | POA: Diagnosis present

## 2018-10-03 DIAGNOSIS — E785 Hyperlipidemia, unspecified: Secondary | ICD-10-CM | POA: Diagnosis present

## 2018-10-03 LAB — CBC WITH DIFFERENTIAL/PLATELET
Abs Immature Granulocytes: 0.09 10*3/uL — ABNORMAL HIGH (ref 0.00–0.07)
BASOS PCT: 0 %
Basophils Absolute: 0.1 10*3/uL (ref 0.0–0.1)
EOS ABS: 0 10*3/uL (ref 0.0–0.5)
Eosinophils Relative: 0 %
HCT: 47.6 % — ABNORMAL HIGH (ref 36.0–46.0)
Hemoglobin: 15.2 g/dL — ABNORMAL HIGH (ref 12.0–15.0)
Immature Granulocytes: 1 %
Lymphocytes Relative: 6 %
Lymphs Abs: 1 10*3/uL (ref 0.7–4.0)
MCH: 31.4 pg (ref 26.0–34.0)
MCHC: 31.9 g/dL (ref 30.0–36.0)
MCV: 98.3 fL (ref 80.0–100.0)
Monocytes Absolute: 0.9 10*3/uL (ref 0.1–1.0)
Monocytes Relative: 5 %
Neutro Abs: 15.4 10*3/uL — ABNORMAL HIGH (ref 1.7–7.7)
Neutrophils Relative %: 88 %
PLATELETS: 199 10*3/uL (ref 150–400)
RBC: 4.84 MIL/uL (ref 3.87–5.11)
RDW: 12.2 % (ref 11.5–15.5)
WBC: 17.5 10*3/uL — AB (ref 4.0–10.5)
nRBC: 0 % (ref 0.0–0.2)

## 2018-10-03 LAB — COMPREHENSIVE METABOLIC PANEL
ALT: 18 U/L (ref 0–44)
AST: 20 U/L (ref 15–41)
Albumin: 4.1 g/dL (ref 3.5–5.0)
Alkaline Phosphatase: 66 U/L (ref 38–126)
Anion gap: 12 (ref 5–15)
BUN: 12 mg/dL (ref 8–23)
CO2: 28 mmol/L (ref 22–32)
Calcium: 9 mg/dL (ref 8.9–10.3)
Chloride: 94 mmol/L — ABNORMAL LOW (ref 98–111)
Creatinine, Ser: 0.52 mg/dL (ref 0.44–1.00)
GFR calc Af Amer: >60 mL/min (ref >60)
GFR calc non Af Amer: >60 mL/min (ref >60)
Glucose, Bld: 93 mg/dL (ref 70–99)
Potassium: 4.2 mmol/L (ref 3.5–5.1)
Sodium: 134 mmol/L — ABNORMAL LOW (ref 135–145)
Total Bilirubin: 1.3 mg/dL — ABNORMAL HIGH (ref 0.3–1.2)
Total Protein: 7.4 g/dL (ref 6.5–8.1)

## 2018-10-03 LAB — INFLUENZA PANEL BY PCR (TYPE A & B)
INFLAPCR: NEGATIVE
Influenza B By PCR: NEGATIVE

## 2018-10-03 LAB — TROPONIN I: Troponin I: <0.03 ng/mL (ref <0.03)

## 2018-10-03 LAB — BRAIN NATRIURETIC PEPTIDE: B NATRIURETIC PEPTIDE 5: 72.3 pg/mL (ref 0.0–100.0)

## 2018-10-03 MED ORDER — LORAZEPAM 0.5 MG PO TABS: 0.5000 mg | ORAL_TABLET | Freq: Two times a day (BID) | ORAL | Status: DC | PRN

## 2018-10-03 MED ORDER — AZITHROMYCIN 250 MG PO TABS
500.0000 mg | ORAL_TABLET | Freq: Every day | ORAL | Status: AC
  Administered 2018-10-03: 500 mg via ORAL
  Filled 2018-10-03: qty 2

## 2018-10-03 MED ORDER — IPRATROPIUM-ALBUTEROL 0.5-2.5 (3) MG/3ML IN SOLN
3.0000 mL | Freq: Four times a day (QID) | RESPIRATORY_TRACT | Status: DC
  Administered 2018-10-03 – 2018-10-04 (×5): 3 mL via RESPIRATORY_TRACT
  Filled 2018-10-03 (×4): qty 3

## 2018-10-03 MED ORDER — PREDNISONE 20 MG PO TABS
40.0000 mg | ORAL_TABLET | Freq: Every day | ORAL | Status: DC
  Administered 2018-10-04 – 2018-10-05 (×2): 40 mg via ORAL
  Filled 2018-10-03 (×2): qty 2

## 2018-10-03 MED ORDER — AZITHROMYCIN 250 MG PO TABS
250.0000 mg | ORAL_TABLET | Freq: Every day | ORAL | Status: DC
  Administered 2018-10-04 – 2018-10-05 (×2): 250 mg via ORAL
  Filled 2018-10-03 (×2): qty 1

## 2018-10-03 MED ORDER — METHYLPREDNISOLONE SODIUM SUCC 125 MG IJ SOLR
125.0000 mg | Freq: Once | INTRAMUSCULAR | Status: AC
  Administered 2018-10-03: 125 mg via INTRAVENOUS
  Filled 2018-10-03: qty 2

## 2018-10-03 MED ORDER — IPRATROPIUM-ALBUTEROL 0.5-2.5 (3) MG/3ML IN SOLN
3.0000 mL | Freq: Once | RESPIRATORY_TRACT | Status: AC
  Administered 2018-10-03: 3 mL via RESPIRATORY_TRACT
  Filled 2018-10-03: qty 3

## 2018-10-03 MED ORDER — UMECLIDINIUM BROMIDE 62.5 MCG/INH IN AEPB
1.0000 | INHALATION_SPRAY | Freq: Every day | RESPIRATORY_TRACT | Status: DC
  Administered 2018-10-04 – 2018-10-05 (×2): 1 via RESPIRATORY_TRACT
  Filled 2018-10-03: qty 7

## 2018-10-03 MED ORDER — SODIUM CHLORIDE 0.9 % IV BOLUS
500.0000 mL | Freq: Once | INTRAVENOUS | Status: AC
  Administered 2018-10-03: 500 mL via INTRAVENOUS

## 2018-10-03 MED ORDER — ALBUTEROL SULFATE (2.5 MG/3ML) 0.083% IN NEBU: 5.0000 mg | INHALATION_SOLUTION | Freq: Once | RESPIRATORY_TRACT | Status: DC

## 2018-10-03 MED ORDER — IPRATROPIUM-ALBUTEROL 0.5-2.5 (3) MG/3ML IN SOLN
RESPIRATORY_TRACT | Status: AC
  Filled 2018-10-03: qty 3

## 2018-10-03 MED ORDER — ENOXAPARIN SODIUM 40 MG/0.4ML ~~LOC~~ SOLN
40.0000 mg | SUBCUTANEOUS | Status: DC
  Administered 2018-10-03 – 2018-10-04 (×2): 40 mg via SUBCUTANEOUS
  Filled 2018-10-03 (×2): qty 0.4
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
## 2018-10-03 NOTE — Assessment & Plan Note (Signed)
Recent infectious process now improved,  to f/u any worsening symptoms or concerns

## 2018-10-03 NOTE — ED Triage Notes (Signed)
Patient has a productive cough with white and light yellow sputum x 3 weeks and was prescribed antibiotics and was directed to take mucinex. Patient states she went back to her PCP today because she was not getting any better. Patient states her O2 sats were 80% at the office. Patient on O2 3L/min via [place] and Sats 92% in triage.

## 2018-10-03 NOTE — Patient Instructions (Signed)
Please go to Emergency at [facility] for Acute hypoxic respiratory failure, with possible copd exacerbation or pneumonia, though other causes need to be checked as well  Please continue all other medications as before  Please have the pharmacy call with any other refills you may need.  Please keeep your appointments with your specialists as you may have planned

## 2018-10-03 NOTE — ED Notes (Signed)
ED TO INPATIENT HANDOFF REPORT  Name/Age/Gender Natalie Smith 73 y.o. female  Code Status Advance Directive Documentation     Most Recent Value  Type of Advance Directive  Living will  Pre-existing out of facility DNR order (yellow form or pink MOST form)  -  "MOST" Form in Place?  -      Home/SNF/Other Home  Chief Complaint SOB; Sent from Dr for Acute Respiratory Failure  Level of Care/Admitting Diagnosis ED Disposition    ED Disposition Condition Comment   Admit  Hospital Area: [facility] [[id number]]  Level of Care: Med-Surg [16]  Diagnosis: COPD exacerbation [name]) [[id number]]  Admitting Physician: Inez Catalina [id number]  Attending Physician: Inez Catalina [[id number]]  PT Class (Do Not Modify): Observation [104]  PT Acc Code (Do Not Modify): Observation [10022]       Medical History Past Medical History:  Diagnosis Date  . Allergic rhinitis   . ALLERGIC RHINITIS 03/01/2010  . Anxiety   . ANXIETY 10/21/2008  . CERVICAL RADICULOPATHY, RIGHT 02/19/2010  . COPD (chronic obstructive pulmonary disease) (HCC)   . EPIGASTRIC PAIN 02/19/2010  . Gallstones 07/22/2011  . GERD (gastroesophageal reflux disease) 08/09/2013  . Headache(784.0) 04/21/2007  . Hyperlipidemia   . HYPERLIPIDEMIA 10/21/2008  . LEG PAIN, LEFT 08/20/2009  . RASH-NONVESICULAR 06/26/2007  . THYROID DISORDER 04/21/2007  . Thyroid nodule 1978   benign  . Vitamin D deficiency 11/22/2011    Allergies Allergies  Allergen Reactions  . Codeine Nausea And Vomiting  . Tramadol Itching    IV Location/Drains/Wounds Patient Lines/Drains/Airways Status   Active Line/Drains/Airways    Name:   Placement date:   Placement time:   Site:   Days:   Peripheral IV 10/03/18 Left Hand   10/03/18    1354    Hand   less than 1          Labs/Imaging Results for orders placed or performed during the hospital encounter of 10/03/18 (from the past 48 hour(s))  Comprehensive metabolic panel     Status:  Abnormal   Collection Time: 10/03/18  1:55 PM  Result Value Ref Range   Sodium 134 (L) 135 - 145 mmol/L   Potassium 4.2 3.5 - 5.1 mmol/L   Chloride 94 (L) 98 - 111 mmol/L   CO2 28 22 - 32 mmol/L   Glucose, Bld 93 70 - 99 mg/dL   BUN 12 8 - 23 mg/dL   Creatinine, Ser 1.76 0.44 - 1.00 mg/dL   Calcium 9.0 8.9 - 16.0 mg/dL   Total Protein 7.4 6.5 - 8.1 g/dL   Albumin 4.1 3.5 - 5.0 g/dL   AST 20 15 - 41 U/L   ALT 18 0 - 44 U/L   Alkaline Phosphatase 66 38 - 126 U/L   Total Bilirubin 1.3 (H) 0.3 - 1.2 mg/dL   GFR calc non Af Amer >60 >60 mL/min   GFR calc Af Amer >60 >60 mL/min   Anion gap 12 5 - 15    Comment: Performed at [facility], [address], [place], [place] [address]  Brain natriuretic peptide     Status: None   Collection Time: 10/03/18  1:55 PM  Result Value Ref Range   B Natriuretic Peptide 72.3 0.0 - 100.0 pg/mL    Comment: Performed at [facility], [address], [place], [place] [address]  Troponin I - Once     Status: None   Collection Time: 10/03/18  1:55 PM  Result Value Ref Range   Troponin I <0.03 <0.03 ng/mL    Comment: Performed at [facility], [address] [place], [place] [address]  CBC with Differential     Status: Abnormal   Collection Time: 10/03/18  1:55 PM  Result Value Ref Range   WBC 17.5 (H) 4.0 - 10.5 K/uL   RBC 4.84 3.87 - 5.11 MIL/uL   Hemoglobin 15.2 (H) 12.0 - 15.0 g/dL   HCT 93.8 (H) 10.1 - 75.1 %   MCV 98.3 80.0 - 100.0 fL   MCH 31.4 26.0 - 34.0 pg   MCHC 31.9 30.0 - 36.0 g/dL   RDW 02.5 85.2 - 77.8 %   Platelets 199 150 - 400 K/uL   nRBC 0.0 0.0 - 0.2 %   Neutrophils Relative % 88 %   Neutro Abs 15.4 (H) 1.7 - 7.7 K/uL   Lymphocytes Relative 6 %   Lymphs Abs 1.0 0.7 - 4.0 K/uL   Monocytes Relative 5 %   Monocytes Absolute 0.9 0.1 - 1.0 K/uL   Eosinophils Relative 0 %   Eosinophils Absolute 0.0 0.0 - 0.5 K/uL   Basophils Relative 0 %   Basophils Absolute 0.1 0.0 -  0.1 K/uL   Immature Granulocytes 1 %   Abs Immature Granulocytes 0.09 (H) 0.00 - 0.07 K/uL    Comment: Performed at [facility], [address], [place], [place] [address]  Influenza panel by PCR (type A & B)     Status: None   Collection Time: 10/03/18  4:13 PM  Result Value Ref Range   Influenza A By PCR NEGATIVE NEGATIVE   Influenza B By PCR NEGATIVE NEGATIVE    Comment: (NOTE) The Xpert Xpress Flu assay is intended as an aid in the diagnosis of  influenza and should not be used as a sole basis for treatment.  This  assay is FDA approved for nasopharyngeal swab specimens only. Nasal  washings and aspirates are unacceptable for Xpert Xpress Flu testing. Performed at [facility], [address] [place], [place] [address]    Dg Chest 2 View  Result Date: 10/03/2018 CLINICAL DATA:  Acute shortness of breath.  Smoking history. EXAM: CHEST - 2 VIEW COMPARISON:  01/31/2018 and prior radiographs FINDINGS: The cardiomediastinal silhouette is unremarkable. An 8 mm nodular opacity overlying the mid RIGHT lung is noted on the frontal view and not identified on prior radiographs. COPD type changes are noted. There is no evidence of focal airspace disease, pulmonary edema, pulmonary mass, pleural effusion, or pneumothorax. No acute bony abnormalities are identified. IMPRESSION: 1. 8 mm nodular opacity overlying the mid RIGHT lung which may represent a pulmonary nodule. Chest CT is recommended for further evaluation. Electronically Signed   By: Harmon Pier M.D.   On: 10/03/2018 13:51    Pending Labs [employer] (From admission, onward)    Start     Ordered   Signed and Held  HIV antibody  Once,   R     Signed and Held   Signed and Held  CBC  (enoxaparin (LOVENOX)    CrCl >/= 30 ml/min)  Once,   R    Comments:  Baseline for enoxaparin therapy IF NOT ALREADY DRAWN.  Notify MD if PLT < 100 K.    Signed and Held   Signed and Held  Creatinine, serum   (enoxaparin (LOVENOX)    CrCl >/= 30 ml/min)  Once,   R    Comments:  Baseline for enoxaparin therapy IF NOT ALREADY DRAWN.    Signed and Held   Signed and Held  Creatinine, serum  (enoxaparin (LOVENOX)    CrCl >/= 30 ml/min)  Weekly,   R    Comments:  while on enoxaparin therapy    Signed and Held   Signed and Held  Comprehensive metabolic panel  Tomorrow morning,   R     Signed and Held   Signed and Held  CBC  Tomorrow morning,   R     Signed and Held          Vitals/Pain Today's Vitals   10/03/18 1700 10/03/18 1707 10/03/18 1858 10/03/18 1900  BP: 130/62 130/62 130/65 116/72  Pulse:  (!) 109 95   Resp: 20 (!) 25 20 (!) 22  Temp:      TempSrc:      SpO2: 91% 93% 92% 94%  Weight:      Height:      PainSc:        Isolation Precautions No active isolations  Medications Medications  sodium chloride 0.9 % bolus 500 mL (0 mLs Intravenous Stopped 10/03/18 1533)  ipratropium-albuterol (DUONEB) 0.5-2.5 (3) MG/3ML nebulizer solution 3 mL (3 mLs Nebulization Given 10/03/18 1425)  methylPREDNISolone sodium succinate (SOLU-MEDROL) 125 mg/2 mL injection 125 mg (125 mg Intravenous Given 10/03/18 1531)  ipratropium-albuterol (DUONEB) 0.5-2.5 (3) MG/3ML nebulizer solution 3 mL (3 mLs Nebulization Given 10/03/18 1532)  ipratropium-albuterol (DUONEB) 0.5-2.5 (3) MG/3ML nebulizer solution 3 mL (3 mLs Nebulization Given 10/03/18 1543)    Mobility walks

## 2018-10-03 NOTE — ED Notes (Signed)
Triage completed by Vickii Penna., RN instead of Roda Shutters, NT.

## 2018-10-03 NOTE — ED Notes (Signed)
Bed: WA12 Expected date:  Expected time:  Means of arrival:  Comments: Triage 2 

## 2018-10-03 NOTE — Assessment & Plan Note (Signed)
Has been somewhat labile with illness per pt with checks at home 140-160's, but no complaint of very high HR or palpitations, no hx of afib or other arrythmia

## 2018-10-03 NOTE — Assessment & Plan Note (Addendum)
With hypoxia, and exam with little to no wheezing, but suspect related to COPD with possible exacerbation and/or pneumonia; also cant completely r/o other such as volume overload or PE though copd exacerbation is thought more likely;  Pt refuses ambulance from the office for across the street trip to [facility] ED, so is calling a friend to help her to get the ED entrance without walking the parking lot distance.    Note:  Total time for pt hx, exam, review of record with pt in the room, determination of diagnoses and plan for further eval and tx is > 40 min, with over 50% spent in coordination and counseling of patient including the differential dx, tx, further evaluation and other management of acute hypoxic respiratory failure, probable copd exacerbation, and possible CAP
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
## 2018-10-04 ENCOUNTER — Observation Stay (HOSPITAL_COMMUNITY): Payer: Medicare HMO

## 2018-10-04 DIAGNOSIS — J189 Pneumonia, unspecified organism: Secondary | ICD-10-CM | POA: Diagnosis not present

## 2018-10-04 DIAGNOSIS — Z833 Family history of diabetes mellitus: Secondary | ICD-10-CM | POA: Diagnosis not present

## 2018-10-04 DIAGNOSIS — Z820 Family history of epilepsy and other diseases of the nervous system: Secondary | ICD-10-CM | POA: Diagnosis not present

## 2018-10-04 DIAGNOSIS — J441 Chronic obstructive pulmonary disease with (acute) exacerbation: Secondary | ICD-10-CM

## 2018-10-04 DIAGNOSIS — R911 Solitary pulmonary nodule: Secondary | ICD-10-CM | POA: Diagnosis present

## 2018-10-04 DIAGNOSIS — F329 Major depressive disorder, single episode, unspecified: Secondary | ICD-10-CM | POA: Diagnosis present

## 2018-10-04 DIAGNOSIS — D751 Secondary polycythemia: Secondary | ICD-10-CM | POA: Diagnosis present

## 2018-10-04 DIAGNOSIS — M479 Spondylosis, unspecified: Secondary | ICD-10-CM | POA: Diagnosis present

## 2018-10-04 DIAGNOSIS — J440 Chronic obstructive pulmonary disease with acute lower respiratory infection: Secondary | ICD-10-CM | POA: Diagnosis not present

## 2018-10-04 DIAGNOSIS — Z87891 Personal history of nicotine dependence: Secondary | ICD-10-CM | POA: Diagnosis not present

## 2018-10-04 DIAGNOSIS — E785 Hyperlipidemia, unspecified: Secondary | ICD-10-CM | POA: Diagnosis not present

## 2018-10-04 DIAGNOSIS — Z825 Family history of asthma and other chronic lower respiratory diseases: Secondary | ICD-10-CM | POA: Diagnosis not present

## 2018-10-04 DIAGNOSIS — E559 Vitamin D deficiency, unspecified: Secondary | ICD-10-CM | POA: Diagnosis not present

## 2018-10-04 DIAGNOSIS — R0902 Hypoxemia: Secondary | ICD-10-CM | POA: Diagnosis present

## 2018-10-04 DIAGNOSIS — Z8249 Family history of ischemic heart disease and other diseases of the circulatory system: Secondary | ICD-10-CM | POA: Diagnosis not present

## 2018-10-04 DIAGNOSIS — F17210 Nicotine dependence, cigarettes, uncomplicated: Secondary | ICD-10-CM | POA: Diagnosis present

## 2018-10-04 DIAGNOSIS — F419 Anxiety disorder, unspecified: Secondary | ICD-10-CM | POA: Diagnosis present

## 2018-10-04 DIAGNOSIS — J9601 Acute respiratory failure with hypoxia: Secondary | ICD-10-CM | POA: Diagnosis not present

## 2018-10-04 DIAGNOSIS — K219 Gastro-esophageal reflux disease without esophagitis: Secondary | ICD-10-CM | POA: Diagnosis not present

## 2018-10-04 LAB — COMPREHENSIVE METABOLIC PANEL
ALT: 14 U/L (ref 0–44)
AST: 13 U/L — ABNORMAL LOW (ref 15–41)
Albumin: 3.6 g/dL (ref 3.5–5.0)
Alkaline Phosphatase: 57 U/L (ref 38–126)
Anion gap: 9 (ref 5–15)
BUN: 13 mg/dL (ref 8–23)
CO2: 29 mmol/L (ref 22–32)
CREATININE: 0.51 mg/dL (ref 0.44–1.00)
Calcium: 8.7 mg/dL — ABNORMAL LOW (ref 8.9–10.3)
Chloride: 96 mmol/L — ABNORMAL LOW (ref 98–111)
GFR calc Af Amer: >60 mL/min (ref >60)
GFR calc non Af Amer: >60 mL/min (ref >60)
Glucose, Bld: 96 mg/dL (ref 70–99)
Potassium: 3.8 mmol/L (ref 3.5–5.1)
Sodium: 134 mmol/L — ABNORMAL LOW (ref 135–145)
Total Bilirubin: 1.2 mg/dL (ref 0.3–1.2)
Total Protein: 6.6 g/dL (ref 6.5–8.1)

## 2018-10-04 LAB — CBC
HCT: 44 % (ref 36.0–46.0)
Hemoglobin: 13.8 g/dL (ref 12.0–15.0)
MCH: 30.8 pg (ref 26.0–34.0)
MCHC: 31.4 g/dL (ref 30.0–36.0)
MCV: 98.2 fL (ref 80.0–100.0)
Platelets: 185 10*3/uL (ref 150–400)
RBC: 4.48 MIL/uL (ref 3.87–5.11)
RDW: 12.2 % (ref 11.5–15.5)
WBC: 13.8 10*3/uL — ABNORMAL HIGH (ref 4.0–10.5)
nRBC: 0 % (ref 0.0–0.2)

## 2018-10-04 LAB — HIV ANTIBODY (ROUTINE TESTING W REFLEX): HIV Screen 4th Generation wRfx: NONREACTIVE

## 2018-10-04 MED ORDER — ACETAMINOPHEN 325 MG PO TABS
650.0000 mg | ORAL_TABLET | Freq: Four times a day (QID) | ORAL | Status: DC | PRN
  Administered 2018-10-04: 650 mg via ORAL
  Filled 2018-10-04: qty 2

## 2018-10-04 MED ORDER — IPRATROPIUM-ALBUTEROL 0.5-2.5 (3) MG/3ML IN SOLN
3.0000 mL | Freq: Three times a day (TID) | RESPIRATORY_TRACT | Status: DC
  Administered 2018-10-05: 3 mL via RESPIRATORY_TRACT
  Filled 2018-10-04: qty 3

## 2018-10-04 NOTE — Progress Notes (Signed)
[facility] PROGRESS NOTE  Perrin MalteseBonnie More ZOX:[id number] DOB: 05/07/1946 DOA: 10/03/2018 PCP: Corwin LevinsJohn, James W, MD   Narrative: 73 year old Caucasian female Known smoker ~3 weeks ago 1 pack/day X 50 years Prior left ankle fracture 2001 Admitted with cough mild hypoxia   A & Plan COPD exacerbation Continue azithromycin, prednisone 40 and ambulate a couple of times today to ensure no oxygen requirement She will need significant education regarding inhalers as well as disease process so not quite ready for discharge today in addition not clear that she will not require oxygen just yet Leukocytosis-would rpt CXR in am to enusre no PNA and monitor for fever overnight---she 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  Stable at this time  Fabio Beringlovenox   Amran Malter, MD  [facility] [employer] app OR -[email or web address] 7PM-7AM contact night coverage as above 10/04/2018, 1:31 PM  LOS: 0 days   Consultants:  n  Procedures:  n  Antimicrobials:  azithromycin  Interval history/Subjective: Awake alert some cough some sputum but improved eaitng ok overall some improvement  Objective:  Vitals:  Vitals:   10/04/18 0847 10/04/18 0945  BP:    Pulse:    Resp:    Temp:    SpO2: 92% 93%    Exam:   eomi ncat Slight wheeze no rales no rhinchi No added sound s1 s2 no m/r/g abd soft nt nd no rebound no guard Neuro intact without deficit Seen ambulating hall without issue   I have personally reviewed the following:  DATA   Labs:  Wbc 17-->13  bmet stable  Imaging studies:   n  Medical tests:  n   Test discussed with performing physician:  n  Decision to obtain old records:  n  Review and summation of old records:  n  Scheduled Meds: . azithromycin  250 mg Oral Daily  . enoxaparin (LOVENOX) injection  40 mg Subcutaneous Q24H  . ipratropium-albuterol  3 mL Nebulization Q6H  . predniSONE  40 mg Oral Q breakfast  . umeclidinium bromide  1 puff Inhalation  Daily   Continuous Infusions:  Active Problems:   Anxiety and depression   Acute respiratory failure (HCC)   COPD exacerbation (HCC)   LOS: 0 days

## 2018-10-04 NOTE — Progress Notes (Signed)
0130 Ambulated 300' in hallway after respiratory Tx with no SOB, O2 3L N/C with pulse OX reading 88-91% and pulse 96-115 bpm.  Writer stopped Patient every 10-20 feet to breath deeper when O2 Sat <88%.  Reported dizziness if O2 Sat > 92%.  Instruction on purse-lip breathing effective. Incentive Spirometer  [phone] cc  With breath sounds diminished throughout as initially asssessed.  Reports history of cold hands and just quitting smoking 2 weeks ago.  Patient found continued monitoring of pulse and O2 Sat informative while walking.  Jovial mood with expressed interest in continued monitoring and treatment for her COPD.  Expressive of her needs.

## 2018-10-04 NOTE — Evaluation (Signed)
Occupational Therapy Evaluation Patient Details Name: Natalie Smith MRN: [id number] DOB: 1945-11-21 Today's Date: 10/04/2018    History of Present Illness Natalie Smith is a 73 y.o. female with medical history significant of COPD, GERD, anxiety who presents for cough and hypoxia.   Clinical Impression   Pt admitted with COPD exacerbation. Pt currently with functional limitations due to the deficits listed below (see OT Problem List).  Pt will benefit from skilled OT to increase their safety and independence with ADL and functional mobility for ADL to facilitate discharge to venue listed below.      Follow Up Recommendations  Supervision - Intermittent    Equipment Recommendations  None recommended by OT       Precautions / Restrictions Precautions Precautions: Other (comment) Precaution Comments: oxygen saturday.  Pt drops quickly      Mobility Bed Mobility Overal bed mobility: Independent                Transfers Overall transfer level: Independent               General transfer comment: VC to take deep breates, rest as needed.        ADL either performed or assessed with clinical judgement   ADL Overall ADL's : Independent                                       General ADL Comments: Pt is I with ADL activity but sats dropping quickly with activity. OT initiated energy conservation education but pt very overwhelmed is this is the first time she has ever been on oxygen and she is having a hard time understanding why her sats on are dropping     Vision Patient Visual Report: No change from baseline              Pertinent Vitals/Pain Pain Assessment: No/denies pain     Hand Dominance     Extremity/Trunk Assessment Upper Extremity Assessment Upper Extremity Assessment: Generalized weakness           Communication Communication Communication: No difficulties   Cognition Arousal/Alertness: Awake/alert Behavior During Therapy:  WFL for tasks assessed/performed Overall Cognitive Status: Within Functional Limits for tasks assessed                                                Home Living Family/patient expects to be discharged to:: Private residence Living Arrangements: Alone Available Help at Discharge: Family Type of Home: House       Home Layout: One level     Bathroom Shower/Tub: Chief Strategy Officer: Standard     Home Equipment: None          Prior Functioning/Environment Level of Independence: Independent        Comments: 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        OT Problem List: Cardiopulmonary status limiting activity      OT Treatment/Interventions: Patient/family education;Self-care/ADL training;Energy conservation    OT Goals(Current goals can be found in the care plan section) Acute Rehab OT Goals Patient Stated Goal: go back to work on friday Time For Goal Achievement: 10/04/18 Potential to Achieve Goals: Good ADL Goals Additional ADL Goal #1: Pt will verbalize  and apply energy conservation strategies to ADL activity at mod I level  OT Frequency: Min 2X/week    AM-PAC OT "6 Clicks" Daily Activity     Outcome Measure Help from another person eating meals?: None Help from another person taking care of personal grooming?: None Help from another person toileting, which includes using toliet, bedpan, or urinal?: None Help from another person bathing (including washing, rinsing, drying)?: None Help from another person to put on and taking off regular upper body clothing?: None Help from another person to put on and taking off regular lower body clothing?: None 6 Click Score: 24   End of Session Nurse Communication: Other (comment)(oxygen saturation)  Activity Tolerance: Patient limited by fatigue Patient left: in bed;with call bell/phone within reach;with family/visitor present  OT Visit Diagnosis: Muscle  weakness (generalized) (M62.81)                Time: 2703-5009 OT Time Calculation (min): 22 min Charges:  OT General Charges $OT Visit: 1 Visit OT Evaluation $OT Eval Low Complexity: 1 Low  Lise Auer, OT Acute Rehabilitation Services [phone] Office- [phone]     Jeanie Sewer, Karin Golden D 10/04/2018, 6:37 PM

## 2018-10-04 NOTE — Progress Notes (Signed)
Nutrition Brief Note  RD consulted via COPD protocol.  Patient's weight is stable. Pt reports no weight loss or appetite change.  Wt Readings from Last 15 Encounters:  10/03/18 64.4 kg  10/03/18 64 kg  09/12/18 64.9 kg  06/15/18 64.9 kg  05/01/18 65.9 kg  03/06/18 66.7 kg  01/31/18 66.2 kg  06/16/17 65.5 kg  05/17/17 65.9 kg  03/17/17 64.9 kg  03/03/17 64.9 kg  08/26/16 59.9 kg  02/26/16 63.1 kg  10/21/15 66.6 kg  02/13/15 68.1 kg    Body mass index is 25.15 kg/m. Patient meets criteria for normal based on current BMI.   Current diet order is regular. Labs and medications reviewed.   No nutrition interventions warranted at this time. If nutrition issues arise, please consult RD.   Tilda Franco, MS, RD, LDN [facility] Inpatient Clinical Dietitian Pager: [phone] After Hours Pager: [phone]

## 2018-10-04 NOTE — Progress Notes (Signed)
PT Cancellation Note  Patient Details Name: Natalie Smith MRN: [id number] DOB: 12-04-1945   Cancelled Treatment:    Reason Eval/Treat Not Completed: PT screened, no needs identified, will sign off   Per nursing notes, patient ambulating in hallway for 300' without issue or need for AD. Nursing assessing O2 needs. PT speaking with patient with patient declining any PT needs at this time. Reports she is ambulating at her baseline status. No acute PT needs identified. PT to sign off. Please re-consult if any needs should arise.    Kipp Laurence, PT, DPT Supplemental Physical Therapist 10/04/18 11:06 AM Pager: [phone] Office: [phone]

## 2018-10-05 ENCOUNTER — Telehealth: Payer: Self-pay | Admitting: *Deleted

## 2018-10-05 LAB — CBC WITH DIFFERENTIAL/PLATELET
Abs Immature Granulocytes: 0.03 10*3/uL (ref 0.00–0.07)
Basophils Absolute: 0 10*3/uL (ref 0.0–0.1)
Basophils Relative: 0 %
EOS ABS: 0.1 10*3/uL (ref 0.0–0.5)
Eosinophils Relative: 1 %
HCT: 43.2 % (ref 36.0–46.0)
Hemoglobin: 13.5 g/dL (ref 12.0–15.0)
Immature Granulocytes: 0 %
Lymphocytes Relative: 14 %
Lymphs Abs: 1.3 10*3/uL (ref 0.7–4.0)
MCH: 31.3 pg (ref 26.0–34.0)
MCHC: 31.3 g/dL (ref 30.0–36.0)
MCV: 100.2 fL — ABNORMAL HIGH (ref 80.0–100.0)
Monocytes Absolute: 0.7 10*3/uL (ref 0.1–1.0)
Monocytes Relative: 8 %
Neutro Abs: 6.8 10*3/uL (ref 1.7–7.7)
Neutrophils Relative %: 77 %
Platelets: 186 10*3/uL (ref 150–400)
RBC: 4.31 MIL/uL (ref 3.87–5.11)
RDW: 12.1 % (ref 11.5–15.5)
WBC: 8.9 10*3/uL (ref 4.0–10.5)
nRBC: 0 % (ref 0.0–0.2)

## 2018-10-05 MED ORDER — PREDNISONE 20 MG PO TABS: 40.0000 mg | ORAL_TABLET | Freq: Every day | ORAL | 0 refills | Status: DC

## 2018-10-05 MED ORDER — MOMETASONE FURO-FORMOTEROL FUM 100-5 MCG/ACT IN AERO: 2.0000 | INHALATION_SPRAY | Freq: Two times a day (BID) | RESPIRATORY_TRACT | 1 refills | Status: DC

## 2018-10-05 MED ORDER — MOMETASONE FURO-FORMOTEROL FUM 100-5 MCG/ACT IN AERO
2.0000 | INHALATION_SPRAY | Freq: Two times a day (BID) | RESPIRATORY_TRACT | Status: DC
  Administered 2018-10-05: 2 via RESPIRATORY_TRACT
  Filled 2018-10-05: qty 8.8

## 2018-10-05 MED ORDER — LEVOFLOXACIN 500 MG PO TABS: 500.0000 mg | ORAL_TABLET | Freq: Every day | ORAL | 0 refills | Status: DC

## 2018-10-05 MED ORDER — LEVOFLOXACIN 500 MG PO TABS
500.0000 mg | ORAL_TABLET | Freq: Every day | ORAL | Status: DC
  Filled 2018-10-05: qty 1

## 2018-10-05 MED ORDER — ALBUTEROL SULFATE HFA 108 (90 BASE) MCG/ACT IN AERS: 2.0000 | INHALATION_SPRAY | Freq: Four times a day (QID) | RESPIRATORY_TRACT | 2 refills | Status: DC | PRN

## 2018-10-05 MED ORDER — UMECLIDINIUM BROMIDE 62.5 MCG/INH IN AEPB: 1.0000 | INHALATION_SPRAY | Freq: Every day | RESPIRATORY_TRACT | 1 refills | Status: DC

## 2018-10-05 NOTE — Telephone Encounter (Signed)
Pt was on TCM report call pt to make hosp f/u w/PCP. Pt states she will be changing providers so she does not want to make appt...Natalie Smith

## 2018-10-05 NOTE — Discharge Summary (Signed)
Physician Discharge Summary  [name] [id number] DOB: 08/09/1946 DOA: 10/03/2018  PCP: Corwin LevinsJohn, James W, MD  Admit date: 10/03/2018 Discharge date: 10/05/2018  Time spent: 25 minutes  Recommendations for Outpatient Follow-up:  1. Needs chest x-ray in 1 month and consideration for outpatient CT scan given smoking history 2. New prescriptions of albuterol mometasone and Incruse Ellipta given in addition to prednisone burst and Levaquin to complete treatment for pneumonia 3. Recommend further education as an outpatient and may be referral to pulmonology if needed 4. New prescription of 2 L at all times oxygen and will need to recheck sats when follows up with PCP  Discharge Diagnoses:  Active Problems:   Anxiety and depression   Acute respiratory failure (HCC)   COPD exacerbation (HCC)   Discharge Condition: Improved  Diet recommendation: Heart healthy  Filed Weights   10/03/18 1235 10/03/18 1247  Weight: 64.4 kg 64.4 kg    History of present illness:  73 year old Caucasian female Known smoker ~3 weeks ago 1 pack/day X 50 years Prior left ankle fracture 2001 Admitted with cough mild hypoxia   Hospital Course:  Community-acquired pneumonia On x-ray on admission this was not apparent however on repeat x-ray in 129 pneumonia seems to fluffed out therefore I switched her azithromycin to Levaquin on discharge and she will need to complete 5 days to complete a total of 7 days antibiotics COPD exacerbation On discharge given prednisone albuterol millimeters own and Incruse Ellipta She will need significant education regarding inhalers as well as disease process so will need to follow-up with PCP in about 1 week Hypoxia Had pneumonia superimposed on COPD exacerbation and will need oxygen 2 L at rest her sats were 86% on room air in the room OA spine             Stable at this time   Procedures: Chest x-ray  Consultations:  None  Discharge Exam: Vitals:   10/04/18  2102 10/05/18 0525  BP:  (!) 142/72  Pulse:  79  Resp:  19  Temp:  97.9 F (36.6 C)  SpO2: 95% 96%    General: Awake alert pleasant no distress EOMI NCAT tolerating some diet Cardiovascular: S1-S2 no murmur rub or gallop Respiratory: Clinically clear no rales no rhonchi no wheeze no egophony Throat is soft supple abdomen soft  Discharge Instructions   Discharge Instructions    Diet - low sodium heart healthy   Complete by:  As directed    Discharge instructions   Complete by:  As directed    You have been given new inhalers albuterol for rescue meaning when you feel short of breath you should also take the other inhalers the Dulera twice a day and Incruse Ellipta as directed You did have shortness of breath probably secondary to her pneumonia and will need 5 more days of antibiotics which we have called into your pharmacy please complete the antibiotics Because of your smoking history it would be beneficial as an outpatient to consider a CT scan of your chest in about 6 weeks when normally pneumonia does clear up or at least get a chest x-ray and you can follow-up as an outpatient for this Please get labs within about 2 weeks to ensure your labs are stable We will provide you with oxygen which you should use at all times until you are reassessed in the outpatient setting and we will try and obtain a portable oxygen for you as well and get home health to see you  Increase activity slowly   Complete by:  As directed      Allergies as of 10/05/2018      Reactions   Codeine Nausea And Vomiting   Tramadol Itching      Medication List    TAKE these medications   albuterol 108 (90 Base) MCG/ACT inhaler Commonly known as:  PROVENTIL HFA;VENTOLIN HFA Inhale 2 puffs into the lungs every 6 (six) hours as needed for wheezing or shortness of breath.   BRONKAID 25-400 MG Tabs Generic drug:  ePHEDrine-guaiFENesin Take 1 tablet by mouth daily as needed (asthma symptoms).    levofloxacin 500 MG tablet Commonly known as:  LEVAQUIN Take 1 tablet (500 mg total) by mouth daily.   LORazepam 0.5 MG tablet Commonly known as:  ATIVAN TAKE (1) TABLET TWICE DAILY AS NEEDED FOR ANXIETY. What changed:  See the new instructions.   mometasone-formoterol 100-5 MCG/ACT Aero Commonly known as:  DULERA Inhale 2 puffs into the lungs 2 (two) times daily.   predniSONE 20 MG tablet Commonly known as:  DELTASONE Take 2 tablets (40 mg total) by mouth daily with breakfast. Start taking on:  October 06, 2018   umeclidinium bromide 62.5 MCG/INH Aepb Commonly known as:  INCRUSE ELLIPTA Inhale 1 puff into the lungs daily.            Durable Medical Equipment  (From admission, onward)         Start     Ordered   10/05/18 1013  DME Oxygen  Once    Question Answer Comment  Mode or (Route) Nasal cannula   Liters per Minute 2   Frequency Continuous (stationary and portable oxygen unit needed)   Oxygen conserving device No   Oxygen delivery system Gas      10/05/18 1012         Allergies  Allergen Reactions  . Codeine Nausea And Vomiting  . Tramadol Itching      The results of significant diagnostics from this hospitalization (including imaging, microbiology, ancillary and laboratory) are listed below for reference.    Significant Diagnostic Studies: Dg Chest 2 View  Result Date: 10/04/2018 CLINICAL DATA:  Follow-up pneumonia EXAM: CHEST - 2 VIEW COMPARISON:  10/03/2018 FINDINGS: Cardiac shadow is stable. The lungs are well aerated bilaterally. The previously seen nodular density is less well appreciated on the current exam. Some patchy infiltrate is noted in the right base increased from the prior study. Hyperinflation is again seen. Degenerative change of the thoracic spine is noted. IMPRESSION: Patchy right basilar infiltrate. Previously seen nodule is not as well appreciated on today's exam. Electronically Signed   By: Alcide Clever M.D.   On: 10/04/2018  14:35   Dg Chest 2 View  Result Date: 10/03/2018 CLINICAL DATA:  Acute shortness of breath.  Smoking history. EXAM: CHEST - 2 VIEW COMPARISON:  01/31/2018 and prior radiographs FINDINGS: The cardiomediastinal silhouette is unremarkable. An 8 mm nodular opacity overlying the mid RIGHT lung is noted on the frontal view and not identified on prior radiographs. COPD type changes are noted. There is no evidence of focal airspace disease, pulmonary edema, pulmonary mass, pleural effusion, or pneumothorax. No acute bony abnormalities are identified. IMPRESSION: 1. 8 mm nodular opacity overlying the mid RIGHT lung which may represent a pulmonary nodule. Chest CT is recommended for further evaluation. Electronically Signed   By: Harmon Pier M.D.   On: 10/03/2018 13:51    Microbiology: No results found for this or any previous visit (from the  past 240 hour(s)).   Labs: Basic Metabolic Panel: Recent Labs  Lab 10/03/18 1355 10/04/18 0655  NA 134* 134*  K 4.2 3.8  CL 94* 96*  CO2 28 29  GLUCOSE 93 96  BUN 12 13  CREATININE 0.52 0.51  CALCIUM 9.0 8.7*   Liver Function Tests: Recent Labs  Lab 10/03/18 1355 10/04/18 0655  AST 20 13*  ALT 18 14  ALKPHOS 66 57  BILITOT 1.3* 1.2  PROT 7.4 6.6  ALBUMIN 4.1 3.6   No results for input(s): LIPASE, AMYLASE in the last 168 hours. No results for input(s): AMMONIA in the last 168 hours. CBC: Recent Labs  Lab 10/03/18 1355 10/04/18 0655 10/05/18 0633  WBC 17.5* 13.8* 8.9  NEUTROABS 15.4*  --  6.8  HGB 15.2* 13.8 13.5  HCT 47.6* 44.0 43.2  MCV 98.3 98.2 100.2*  PLT 199 185 186   Cardiac Enzymes: Recent Labs  Lab 10/03/18 1355  TROPONINI <0.03   BNP: BNP (last 3 results) Recent Labs    10/03/18 1355  BNP 72.3    ProBNP (last 3 results) No results for input(s): PROBNP in the last 8760 hours.  CBG: No results for input(s): GLUCAP in the last 168 hours.     Signed:  Rhetta Mura MD   [facility] 10/05/2018,  10:13 AM

## 2018-10-05 NOTE — Progress Notes (Signed)
Patient given discharge instructions, and verbalized an understanding of all discharge instructions.  Patient agrees with discharge plan, and is being discharged in stable medical condition.  Patient given transportation via wheelchair. 

## 2018-10-05 NOTE — Care Management Note (Signed)
Case Management Note  Patient Details  Name: Chavone Crutcher MRN: [id number] Date of Birth: 10-01-1945  Subjective/Objective:                  DISCHARGED  Action/Plan: Home 02 through [facility] Discharged to home with self-care, orders checked for hhc needs. Patient is able to arrangement own appointments and home care. Expected Discharge Date:  10/05/18               Expected Discharge Plan:  Home/Self Care  In-House Referral:     Discharge planning Services  CM Consult  Post Acute Care Choice:  Durable Medical Equipment Choice offered to:  NA  DME Arranged:  Oxygen DME Agency:  [facility]  HH Arranged:    [facility] Agency:     Status of Service:  Completed, signed off  If discussed at Long Length of Stay Meetings, dates discussed:    Additional Comments:  Golda Acre, RN 10/05/2018, 11:11 AM

## 2018-10-05 NOTE — Progress Notes (Signed)
Occupational Therapy Treatment Patient Details Name: Natalie Smith Maclay MRN: [id number] DOB: 05/29/1946 Today's Date: 10/05/2018    History of present illness Natalie Smith Conger is a 73 y.o. female with medical history significant of COPD, GERD, anxiety who presents for cough and hypoxia.   OT comments  Continued energy conservation education and provided handout. Pt had already completed ADL.  Pt verbalizes understanding of strategies and activity modification.  Sats 86% on RA without activity and 90-92% on 02.   Follow Up Recommendations  Supervision - Intermittent    Equipment Recommendations  None recommended by OT    Recommendations for Other Services      Precautions / Restrictions Precautions Precaution Comments: 02; watch sats Restrictions Weight Bearing Restrictions: No       Mobility Bed Mobility Overal bed mobility: Independent                Transfers                      Balance                                           ADL either performed or assessed with clinical judgement   ADL                                         General ADL Comments: pt reports she performed ADL this am on her own.  She did not have an energy conservation handout, so gave this to her and talked to her about activity modification.  Pt's 02 dropped to 86% on RA sitting on bed without activity. Returned to 92% when 02 replaced.  Pt verbalizes understanding of all education. She plans to do a standing shower.  Recommended she wear 02 in the shower and wash as fars as she can reach without bending then rest on commode and cross legs to reach to feet.  Pt demonstrated pursed lip breathing without cues     Vision       Perception     Praxis      Cognition Arousal/Alertness: Awake/alert Behavior During Therapy: WFL for tasks assessed/performed Overall Cognitive Status: Within Functional Limits for tasks assessed                                           Exercises     Shoulder Instructions       General Comments      Pertinent Vitals/ Pain       Pain Assessment: No/denies pain  Home Living                                          Prior Functioning/Environment              Frequency           Progress Toward Goals  OT Goals(current goals can now be found in the care plan section)  Progress towards OT goals: Progressing toward goals(verbalizes ec; demonstrated PLB)     Plan      Co-evaluation  AM-PAC OT "6 Clicks" Daily Activity     Outcome Measure   Help from another person eating meals?: None Help from another person taking care of personal grooming?: None Help from another person toileting, which includes using toliet, bedpan, or urinal?: None Help from another person bathing (including washing, rinsing, drying)?: None Help from another person to put on and taking off regular upper body clothing?: None Help from another person to put on and taking off regular lower body clothing?: None 6 Click Score: 24    End of Session        Activity Tolerance Patient limited by fatigue   Patient Left in bed;with call bell/phone within reach;with family/visitor present   Nurse Communication          Time: 1610-9604 OT Time Calculation (min): 22 min  Charges: OT General Charges $OT Visit: 1 Visit OT Treatments $Therapeutic Activity: 8-22 mins  Marica Otter, OTR/L Acute Rehabilitation Services [phone] [facility] pager [phone] office 10/05/2018   Johnesha Acheampong 10/05/2018, 10:54 AM

## 2018-10-05 NOTE — Progress Notes (Signed)
SATURATION QUALIFICATIONS: (This note is used to comply with regulatory documentation for home oxygen)  Patient Saturations on Room Air at Rest = 86%  Patient Saturations on 2 Liters of oxygen while Ambulating = 90%  Patient needs oxygen to maintain oxygen saturation above 90 percent, while at rest and ambulating.

## 2018-10-06 ENCOUNTER — Telehealth: Payer: Self-pay | Admitting: Internal Medicine

## 2018-10-06 NOTE — Telephone Encounter (Signed)
Please advise Dr Ardyth HarpsHernandez if you are okay with TOC

## 2018-10-06 NOTE — Telephone Encounter (Signed)
Copied from CRM [id number]. Topic: Appointment Scheduling - Transfer of Care >> Oct 06, 2018  3:12 PM Floria Raveling A wrote: Pt would like to transfer due to location  Best number [phone] Please advise   Pt is requesting to transfer FROM: Natalie Smith  Pt is requesting to transfer TO: Ardyth Harps  Reason for requested transfer: Due to location   Send CRM to patient's current PCP (transferring FROM).   Dr. Jonny Smith okay with you?

## 2018-10-06 NOTE — Telephone Encounter (Signed)
Fine with me

## 2018-10-06 NOTE — Telephone Encounter (Signed)
Sending to [place]. °

## 2018-10-06 NOTE — Telephone Encounter (Signed)
Ok with me 

## 2018-10-09 ENCOUNTER — Telehealth: Payer: Self-pay | Admitting: Internal Medicine

## 2018-10-09 NOTE — Telephone Encounter (Signed)
Ok with me 

## 2018-10-09 NOTE — Telephone Encounter (Signed)
Please advise 

## 2018-10-09 NOTE — Telephone Encounter (Signed)
Ok by me

## 2018-10-09 NOTE — Telephone Encounter (Signed)
Copied from CRM [id number]. Topic: Appointment Scheduling - Transfer of Care >> Oct 06, 2018  3:12 PM [name], [name] wrote: Pt would like to transfer due to location  Best number [phone] Please advise   Pt is requesting to transfer FROM: Natalie RuizJohn  Pt is requesting to transfer TO: Natalie Smith  Reason for requested transfer: Due to location   Send CRM to patient's current PCP (transferring FROM). >> Oct 09, 2018  2:32 PM Trula SladeWalter, [name] wrote: Patient is waiting on the status of her PCP change request, because she just got out of the hospital and need to make an appt with her PCP.  Please advise.

## 2018-10-10 NOTE — Telephone Encounter (Signed)
Spoke with patient - states that she is needing the TOC and a HFU. Pt was just d/c from [facility] on 10/05/2018. I explained that we are more than happy to do the [facility] appt to establish care with Dr Ardyth Harps but she will need to see Dr Jonny Ruiz for her HFU prior. Pt states that she will call and get an appt with Dr Jonny Ruiz and then will contact our office to schedule the TOC. Nothing further needed.

## 2018-10-11 NOTE — Telephone Encounter (Signed)
Please schedule transfer  Thanks!

## 2018-10-11 NOTE — Telephone Encounter (Signed)
Called patient to schedule TOC appointment, Pt states she wants to wait until she performers her Hospital F/U with [name] before making an appointment with Dr. Ardyth HarpsHernandez. She states she will call after speaking with [name].

## 2018-10-12 ENCOUNTER — Encounter: Payer: Self-pay | Admitting: Internal Medicine

## 2018-10-12 ENCOUNTER — Ambulatory Visit (INDEPENDENT_AMBULATORY_CARE_PROVIDER_SITE_OTHER): Payer: Medicare HMO | Admitting: Internal Medicine

## 2018-10-12 VITALS — BP 128/86 | HR 97 | Temp 99.1°F | Ht 63.0 in | Wt 148.0 lb

## 2018-10-12 DIAGNOSIS — J449 Chronic obstructive pulmonary disease, unspecified: Secondary | ICD-10-CM

## 2018-10-12 DIAGNOSIS — J189 Pneumonia, unspecified organism: Secondary | ICD-10-CM | POA: Insufficient documentation

## 2018-10-12 DIAGNOSIS — J9601 Acute respiratory failure with hypoxia: Secondary | ICD-10-CM

## 2018-10-12 HISTORY — DX: Pneumonia, unspecified organism: J18.9
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
## 2018-10-12 NOTE — Assessment & Plan Note (Signed)
Clinically resolved though does have low grade temp today, to continue to follow for now, for repeat cxr and/or tx for worsening s/s

## 2018-10-12 NOTE — Assessment & Plan Note (Signed)
Improved now stable, cont same tx, for pulmonary referral

## 2018-10-12 NOTE — Patient Instructions (Addendum)
Your oxygen is low on room air at rest and with exertion, soplease continue the home oxygen for now  Please continue all other medications as before, and refills have been done if requested.  Please have the pharmacy call with any other refills you may need.  Please continue your efforts at being more active, low cholesterol diet, and weight control  Please keep your appointments with your specialists as you may have planned  You will be contacted regarding the referral for: Pulmonary  Good luck with your new PCP!

## 2018-10-12 NOTE — Assessment & Plan Note (Addendum)
Remains o2 dependent, to cont home o2 2L for now, f/u pulmonary and/or PCP (pt has new female PCP soon) to reassess need for contd home o2

## 2018-10-24 ENCOUNTER — Encounter: Payer: Self-pay | Admitting: Internal Medicine

## 2018-10-24 ENCOUNTER — Ambulatory Visit (INDEPENDENT_AMBULATORY_CARE_PROVIDER_SITE_OTHER): Payer: Medicare HMO | Admitting: Internal Medicine

## 2018-10-24 VITALS — BP 140/80 | HR 79 | Temp 97.4°F | Ht 63.0 in | Wt 157.7 lb

## 2018-10-24 DIAGNOSIS — E559 Vitamin D deficiency, unspecified: Secondary | ICD-10-CM | POA: Diagnosis not present

## 2018-10-24 DIAGNOSIS — E785 Hyperlipidemia, unspecified: Secondary | ICD-10-CM | POA: Diagnosis not present

## 2018-10-24 DIAGNOSIS — F17200 Nicotine dependence, unspecified, uncomplicated: Secondary | ICD-10-CM | POA: Diagnosis not present

## 2018-10-24 DIAGNOSIS — K219 Gastro-esophageal reflux disease without esophagitis: Secondary | ICD-10-CM | POA: Diagnosis not present

## 2018-10-24 DIAGNOSIS — R69 Illness, unspecified: Secondary | ICD-10-CM | POA: Diagnosis not present

## 2018-10-24 DIAGNOSIS — J189 Pneumonia, unspecified organism: Secondary | ICD-10-CM

## 2018-10-24 DIAGNOSIS — J449 Chronic obstructive pulmonary disease, unspecified: Secondary | ICD-10-CM | POA: Diagnosis not present

## 2018-10-24 MED ORDER — TIOTROPIUM BROMIDE MONOHYDRATE 18 MCG IN CAPS: 18.0000 ug | ORAL_CAPSULE | Freq: Every day | RESPIRATORY_TRACT | 12 refills | Status: DC

## 2018-10-24 NOTE — Patient Instructions (Addendum)
Great seeing you today.  Instructions: -start using your new medication, spiriva,  2 puffs (1capsule) inhaled daily -cont using Breo inhaler daily -stop incruse ellipta -schedule follow up in 3 months (30 min visit) -return in 3 weeks for your CXR. -Great job on quitting smoking!!!
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
## 2018-10-25 ENCOUNTER — Other Ambulatory Visit: Payer: Self-pay | Admitting: Internal Medicine

## 2018-10-25 DIAGNOSIS — J449 Chronic obstructive pulmonary disease, unspecified: Secondary | ICD-10-CM

## 2018-10-25 MED ORDER — BUDESONIDE-FORMOTEROL FUMARATE 160-4.5 MCG/ACT IN AERO: 2.0000 | INHALATION_SPRAY | Freq: Two times a day (BID) | RESPIRATORY_TRACT | 3 refills | Status: DC

## 2018-10-25 NOTE — Telephone Encounter (Signed)
I have sent in Rx for symbicort in replacement for Breo-Ellipta. She needs to use 2 puffs twice a day of the symbicort.

## 2018-10-25 NOTE — Telephone Encounter (Signed)
Copied from CRM [id number]. Topic: General - Other >> Oct 24, 2018 11:15 AM Percival Spanish wrote: [place] Childes Dr Ardyth Harps   Pt contacted her insurance and they will cover the below    Symbicort or  Advair      Pharmacy [facility]

## 2018-10-27 ENCOUNTER — Other Ambulatory Visit: Payer: Self-pay

## 2018-10-27 NOTE — Patient Outreach (Signed)
[facility]) Care Management  10/27/2018  Natalie Smith 1946/07/05 [id number]   Referral Date: 10/27/2018 Referral Source: [employer] Referral Reason: Assessment hospital D/C 10/05/2018   Outreach Attempt: no answer.  HIPAA compliant voice message left.   Plan: RN CM will attempt again within 4 business days and send letter.   Bary Leriche, RN, MSN [facility] Care Management Care Management Coordinator Direct Line [phone] Toll Free: [phone]  Fax: [phone]

## 2018-10-30 ENCOUNTER — Other Ambulatory Visit: Payer: Self-pay

## 2018-10-30 NOTE — Patient Outreach (Signed)
[facility]) Care Management  10/30/2018  Natalie Smith August 15, 1946 [id number]   Referral Date: 10/27/2018 Referral Source: [employer] Referral Reason: Assessment hospital D/C 10/05/2018   Outreach Attempt: no answer.  HIPAA compliant voice message left.   Plan: RN CM will attempt again within 4 business days.  Bary Leriche, RN, MSN [facility] Care Management Care Management Coordinator Direct Line [phone] Cell [phone] Toll Free: [phone]  Fax: [phone]

## 2018-11-01 ENCOUNTER — Other Ambulatory Visit: Payer: Self-pay

## 2018-11-01 NOTE — Patient Outreach (Signed)
[facility]) Care Management  11/01/2018  Natalie Smith [birth year] [id number]   Referral Date:10/27/2018 Referral Source:[employer] Referral Reason:Assessment hospital D/C 10/05/2018   Outreach Attempt:no answer. HIPAA compliant voice message left.   Plan:RN CM will wait return call. If no return call will close case.   Bary Leriche, RN, MSN [facility] Care Management Care Management Coordinator Direct Line [phone] Cell [phone] Toll Free: [phone]  Fax: [phone]

## 2018-11-04 DIAGNOSIS — J441 Chronic obstructive pulmonary disease with (acute) exacerbation: Secondary | ICD-10-CM | POA: Diagnosis not present

## 2018-11-07 ENCOUNTER — Ambulatory Visit: Payer: Medicare HMO | Admitting: Pulmonary Disease

## 2018-11-09 ENCOUNTER — Ambulatory Visit: Payer: Self-pay

## 2018-11-09 NOTE — Telephone Encounter (Signed)
Patient called in with c/o "hoarseness." She says "I have been hoarse for over a week and coughing has gotten worse the last 2 weeks. The cough is worse at night. I cough up yellow phlegm. I don't have a fever. My throat feels like is closing up when I talk a lot an my voice sounds weak. I have a runny nose and my throat is irritated from talking, not sore. I noticed wheezing at night, I cough, the wheezing goes away then come back. My breathing is fine. Dr. Ardyth Harps told me to come back if my cough gets worse, and it has gotten worse, it's bothersome." I asked about other symptoms, she denies. According to protocol, see PCP within 24 hours for cough/3 days for hoarseness. I advised no openings for Dr. Ardyth Harps, she agrees to see another provider and asked to be seen tomorrow. Appointment scheduled for tomorrow, 11/10/18 at 1600 with Dr. Salomon Fick, care advice given, patient verbalized understanding.   Reason for Disposition . Hoarseness persists > 2 weeks . Change in color of sputum  (e.g., from white to yellow-green sputum)  Answer Assessment - Initial Assessment Questions 1. DESCRIPTION: "Describe your voice."     If I talk a lot, it feels my throat is closing up, my voice is weak 2. ONSET: "When did the hoarseness begin?"     1 week or so ago, getting worse 3. COUGH: "Is there a cough?" If so, ask: "How bad?"     Yes, chest congestion with yellow mucus 4. FEVER: "Do you have a fever?" If so, ask: "What is your temperature, how was it measured, and when did it start?"     No 5. RESPIRATORY STATUS: "Describe your breathing."      Breathing is good 6. ALLERGIES: "Any allergy symptoms?" If so, ask: "What are they?"     Runny nose 7. IRRITANTS: "Do you smoke?" "Have you been exposed to any irritating fumes?"     No, no 8. CAUSE: "What do you think is causing the hoarseness?"     I don't know 9. OTHER SYMPTOMS: "Do you have any other symptoms?" (e.g., sore throat, swelling, foreign body, rash)  Throat irritation from talking 10. PREGNANCY: "Is there any chance you are pregnant?" "When was your last menstrual period?"       No  Answer Assessment - Initial Assessment Questions 1. ONSET: "When did the cough begin?"      Gotten worse last 2 weeks 2. SEVERITY: "How bad is the cough today?"      Not bad, just irritating, worse at night 3. RESPIRATORY DISTRESS: "Describe your breathing."      Good breathing 4. FEVER: "Do you have a fever?" If so, ask: "What is your temperature, how was it measured, and when did it start?"     No 5. SPUTUM: "Describe the color of your sputum" (e.g., clear, white, yellow, green), "Has there been any change recently?"     Yellow, metallic tast 6. HEMOPTYSIS: "Are you coughing up any blood?" If so ask: "How much, flecks, streaks, tablespoons, etc.?"     No 7. CARDIAC HISTORY: "Do you have any history of heart disease?" (e.g., heart attack, congestive heart failure)      No 8. LUNG HISTORY: "Do you have any history of lung disease?"  (e.g., pulmonary embolus, asthma, emphysema/COPD)     COPD 9. OTHER SYMPTOMS: "Do you have any other symptoms? (e.g., runny nose, wheezing, chest pain)     Runny nose, wheezing last night 10.  PREGNANCY: "Is there any chance you are pregnant?" "When was your last menstrual period?"       No 11. TRAVEL: "Have you traveled out of the country in the last month?" (e.g., travel history, exposures)       No  Protocols used: HOARSENESS-A-AH, COUGH - CHRONIC-A-AH

## 2018-11-10 ENCOUNTER — Other Ambulatory Visit: Payer: Self-pay

## 2018-11-10 ENCOUNTER — Ambulatory Visit: Payer: Medicare HMO | Admitting: Family Medicine

## 2018-11-10 NOTE — Patient Outreach (Signed)
[facility]) Care Management  11/10/2018  Natalie Smith Jan 04, 1946 [id number]   Multiple attempts to establish contact with patient without success. No response from letter mailed to patient.   Plan: RN CM will close case at this time.   Bary Leriche, RN, MSN [facility] Care Management Care Management Coordinator Direct Line [phone] Cell [phone] Toll Free: [phone]  Fax: [phone]

## 2018-11-16 ENCOUNTER — Telehealth: Payer: Self-pay

## 2018-11-16 ENCOUNTER — Encounter: Payer: Self-pay | Admitting: Pulmonary Disease

## 2018-11-16 ENCOUNTER — Other Ambulatory Visit: Payer: Self-pay

## 2018-11-16 ENCOUNTER — Ambulatory Visit (INDEPENDENT_AMBULATORY_CARE_PROVIDER_SITE_OTHER)
Admission: RE | Admit: 2018-11-16 | Discharge: 2018-11-16 | Disposition: A | Discharge status: Home / Self Care | Payer: Medicare HMO | Source: Ambulatory Visit | Attending: Pulmonary Disease | Admitting: Pulmonary Disease

## 2018-11-16 ENCOUNTER — Ambulatory Visit (INDEPENDENT_AMBULATORY_CARE_PROVIDER_SITE_OTHER): Payer: Medicare HMO | Admitting: Pulmonary Disease

## 2018-11-16 VITALS — BP 140/70 | HR 75 | Ht 63.0 in | Wt 154.6 lb

## 2018-11-16 DIAGNOSIS — J449 Chronic obstructive pulmonary disease, unspecified: Secondary | ICD-10-CM

## 2018-11-16 DIAGNOSIS — J181 Lobar pneumonia, unspecified organism: Secondary | ICD-10-CM

## 2018-11-16 DIAGNOSIS — R0602 Shortness of breath: Secondary | ICD-10-CM | POA: Diagnosis not present

## 2018-11-16 DIAGNOSIS — J9601 Acute respiratory failure with hypoxia: Secondary | ICD-10-CM | POA: Diagnosis not present

## 2018-11-16 DIAGNOSIS — R05 Cough: Secondary | ICD-10-CM | POA: Diagnosis not present

## 2018-11-16 NOTE — Progress Notes (Signed)
Subjective:   PATIENT ID: Natalie Smith GENDER: female DOB: August 05, 1946, MRN: [id number]   HPI  Chief Complaint  Patient presents with  . Follow-up    Former Dr Jamison Neighbor pt with recent PNA.     Reason for Visit: New consult for COPD and oxygen management.  Referred by Dr. Philip Aspen  Reviewed PCP note from 10/24/2018, summarized as follows: She was hospitalized end of January for COPD exacerbation secondary to pneumonia. Discharged on bronchodilators (Breo and Incruse) and 2 L oxygen.  Attempts were made to change her inhalers however due to affordability she has only been on Symbicort and Breo  Since then, she reports that she has been well controlled on Symbicort.  She stopped taking the Breo due to intolerance (sensation of medication sticking in throat).  She does not use her rescue inhaler.  She will occasionally get short of breath with exertion but this is much improved since hospital discharge.  She has not smoked in over 2 months.  She feels she is able to perform activities of daily living and ambulate within the house without issue.  She has been monitoring her oxygen levels and feel she is improving.  She inquires whether she still needs oxygen.  Social History: 1ppd x 50 years  Environmental exposures: None  I have personally reviewed patient's past medical/family/social history, allergies, current medications.  Past Medical History:  Diagnosis Date  . Allergic rhinitis   . ALLERGIC RHINITIS 03/01/2010  . Anxiety   . ANXIETY 10/21/2008  . CERVICAL RADICULOPATHY, RIGHT 02/19/2010  . COPD (chronic obstructive pulmonary disease) (HCC)   . EPIGASTRIC PAIN 02/19/2010  . Gallstones 07/22/2011  . GERD (gastroesophageal reflux disease) 08/09/2013  . Headache(784.0) 04/21/2007  . Hyperlipidemia   . HYPERLIPIDEMIA 10/21/2008  . LEG PAIN, LEFT 08/20/2009  . RASH-NONVESICULAR 06/26/2007  . THYROID DISORDER 04/21/2007  . Thyroid nodule 1978   benign  . Vitamin D deficiency  11/22/2011     Family History  Problem Relation Age of Onset  . Heart failure Mother   . Emphysema Mother   . Parkinsonism Father   . Hypertension Other   . Diabetes Brother   . Asthma Grandchild   . Cancer Neg Hx      Social History   Occupational History  . 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 Smoking status: Former Smoker    Packs/day: 1.00    Years: 56.00    Pack years: 56.00    Types: Cigarettes    Start date: 01/12/1961  . Smokeless tobacco: Never Used  . Tobacco comment: last cigarette 2 weeks ago.  Substance and Sexual Activity  . Alcohol use: Yes    Alcohol/week: 0.0 standard drinks    Comment: occasionally; vodka on occ  . Drug use: No  . Sexual activity: Not on file    Allergies  Allergen Reactions  . Codeine Nausea And Vomiting  . Tramadol Itching     Outpatient Medications Prior to Visit  Medication Sig Dispense Refill  . albuterol (PROVENTIL HFA;VENTOLIN HFA) 108 (90 Base) MCG/ACT inhaler Inhale 2 puffs into the lungs every 6 (six) hours as needed for wheezing or shortness of breath. 1 Inhaler 2  . budesonide-formoterol (SYMBICORT) 160-4.5 MCG/ACT inhaler Inhale 2 puffs into the lungs 2 (two) times daily. (Patient taking differently: Inhale 2 puffs into the lungs daily. ) 1 Inhaler 3  . LORazepam (ATIVAN) 0.5 MG tablet TAKE (1)  TABLET TWICE DAILY AS NEEDED FOR ANXIETY. (Patient taking differently: Take 0.5 mg by mouth 2 (two) times daily as needed for anxiety. ) 60 tablet 3  . tiotropium (SPIRIVA HANDIHALER) 18 MCG inhalation capsule Place 1 capsule (18 mcg total) into inhaler and inhale daily. 30 capsule 12   No facility-administered medications prior to visit.     Review of Systems  Constitutional: Negative for chills, diaphoresis, fever, malaise/fatigue and weight loss.  HENT: Negative for congestion, ear pain and sore throat.        Hoarseness   Respiratory: Positive for cough, sputum  production and shortness of breath. Negative for hemoptysis and wheezing.   Cardiovascular: Negative for chest pain, palpitations and leg swelling.  Gastrointestinal: Negative for abdominal pain, heartburn and nausea.  Genitourinary: Negative for frequency.  Musculoskeletal: Negative for joint pain and myalgias.  Skin: Negative for itching and rash.  Neurological: Negative for dizziness, weakness and headaches.  Endo/Heme/Allergies: Does not bruise/bleed easily.  Psychiatric/Behavioral: Negative for depression. The patient is not nervous/anxious.      Objective:   Vitals:   11/16/18 1122  BP: 140/70  Pulse: 75  SpO2: 95%  Weight: 154 lb 9.6 oz (70.1 kg)  Height: 5\' 3"  (1.6 m)   SpO2: 95 % O2 Device: None (Room air)  Physical Exam: General: Well-appearing, no acute distress HENT: [place], AT, OP clear, MMM Eyes: EOMI, no scleral icterus Respiratory: Clear to auscultation bilaterally.  No crackles, wheezing or rales Cardiovascular: RRR, -M/R/G, no JVD GI: BS+, soft, nontender Extremities:-Edema,-tenderness Neuro: AAO x4, CNII-XII grossly intact Skin: Intact, no rashes or bruising Psych: Normal mood, normal affect  Data Reviewed:  Imaging: CXR 10/04/18 - Right basilar infiltrate. Hyperinflation.  PFT: FVC 1.66 (61%) FEV1 1.00 (48%) Ratio 63  TLC 101% DLCO 82% Interpretation: Severe obstructive defect with air trapping. Significant bronchodilator response.  Labs: CBC w/diff on 10/05/18 reviewed. Abs eos 100  In-clinic O2 titration 2/6 SpO2 82% on RA on exertion Repeat ambulatory O2 demonstrated no need for O2. Patient expressed concern regarding needing nocturnal O2.  Imaging, labs and tests noted above have been reviewed independently by me.    Assessment & Plan:   Discussion: 73 year old female with 50-pack-year smoking history with recent hospitalization for COPD exacerbation secondary to pneumonia.  Compliant with her bronchodilator therapy and oxygen.  Currently  well controlled on LABA/ICS.   Severe COPD -STOP Breo -CONTINUE Symbicort 160-4.5 mcg 2 puffs twice a day -CONTINUE Albuterol inhaler as needed for shortness of breath or wheezing -Discouraged the use of Bronk-aid due to side effects and limited safety data  Pneumonia CXR today  Acute hypoxemic respiratory failure -Walking test demonstrated no need for supplemental oxygen during the day or with activity. -Plan to obtain overnight oximetry to review nocturnal oxygen saturations  Health Maintenance Pneumonia Pneumovax 02/11/11, Prevnar 02/07/14 Influenza Due CT Lung Screen Due  Orders Placed This Encounter  Procedures  . DG Chest 2 View    Standing Status:   Future    Number of Occurrences:   1    Standing Expiration Date:   01/16/2020    Order Specific Question:   Reason for Exam (SYMPTOM  OR DIAGNOSIS REQUIRED)    Answer:   follow up pneumonia    Order Specific Question:   Preferred imaging location?    Answer:   Internal    Order Specific Question:   Radiology Contrast Protocol - do NOT remove file path    Answer:   [email or web address]  . Pulse oximetry,  overnight    Standing Status:   Future    Standing Expiration Date:   11/16/2019  No orders of the defined types were placed in this encounter.   Return in about 3 months (around 02/16/2019).   Greater than 50% of this patient 45-minute office visit was spent face-to-face in counseling with the patient/family. We discussed medical diagnosis and treatment plan as noted.   Geneva Barrero Mechele Collin, MD [facility] 11/16/2018 1:35 PM  Office Number [phone]

## 2018-11-16 NOTE — Telephone Encounter (Signed)
Natalie Smith with [facility] Radiology Call Report:  IMPRESSION: 1. New partial collapse of the right middle lobe. Chest CT is recommended for further evaluation. 2. Resolved right lower lobe pneumonia.

## 2018-11-16 NOTE — Telephone Encounter (Signed)
Will route this over to Dr. Everardo All

## 2018-11-16 NOTE — Patient Instructions (Addendum)
Severe COPD -CONTINUE Symbicort 160-4.5 mcg 2 puffs twice a day -CONTINUE Albuterol inhaler as needed for shortness of breath or wheezing  Pneumonia CXR today shows atelectasis Plan for repeat CXR at next visit  Acute hypoxemic respiratory failure -Walking test demonstrated no need for supplemental oxygen during the day or with activity. -Plan to obtain overnight oximetry to review nocturnal oxygen saturations

## 2018-11-22 NOTE — Telephone Encounter (Signed)
Dr. Everardo All, please advise if this has been taken care of.

## 2018-11-24 ENCOUNTER — Telehealth: Payer: Self-pay | Admitting: Pulmonary Disease

## 2018-11-24 NOTE — Telephone Encounter (Signed)
Please contact patient with the following message:. After further reviewing your CXR and your symptoms, we recommend CT Chest without contrast. This is not urgent but recommend further evaluation with better imaging in the next few weeks.   Staff, order CT Chest without contrast.  JE  ______________________  Natalie Smith and spoke with patient she does not want to do a CT scan at this time. Will route over to JE for an FYI.

## 2018-11-24 NOTE — Telephone Encounter (Signed)
Attempted to call patient today regarding results and JE recommendations. I did not receive an answer at time of call. I have left a voicemail message for pt to return call. X1

## 2018-11-24 NOTE — Telephone Encounter (Signed)
Please contact patient with the following message:. After further reviewing your CXR and your symptoms, we recommend CT Chest without contrast. This is not urgent but recommend further evaluation with better imaging in the next few weeks.   Staff, order CT Chest without contrast.  JE

## 2018-11-25 DIAGNOSIS — J449 Chronic obstructive pulmonary disease, unspecified: Secondary | ICD-10-CM | POA: Diagnosis not present

## 2018-11-25 DIAGNOSIS — R0902 Hypoxemia: Secondary | ICD-10-CM | POA: Diagnosis not present

## 2018-11-27 NOTE — Telephone Encounter (Signed)
See phone note from 11/24/2018 

## 2018-12-01 ENCOUNTER — Encounter: Payer: Self-pay | Admitting: Pulmonary Disease

## 2018-12-03 NOTE — Progress Notes (Signed)
Go ahead and scan but can you please have a photocopy of the results on my desk?  JE

## 2018-12-04 DIAGNOSIS — J441 Chronic obstructive pulmonary disease with (acute) exacerbation: Secondary | ICD-10-CM | POA: Diagnosis not present

## 2018-12-04 NOTE — Progress Notes (Signed)
Placed a copy of ONO results on Dr. George Hugh desk for review. Original sent to scan.   Dr. Everardo All please advise if you would like an NP to discuss results with patient on televisit, etc.   T

## 2018-12-05 ENCOUNTER — Telehealth: Payer: Self-pay | Admitting: Pulmonary Disease

## 2018-12-05 DIAGNOSIS — J9601 Acute respiratory failure with hypoxia: Secondary | ICD-10-CM

## 2018-12-05 NOTE — Telephone Encounter (Signed)
Returned call to patient and notified Dr. Everardo All has not reviewed ONO yet. We will give her a call with results as soon as available. Pt verbalized understanding. Nothing further needed.

## 2018-12-29 ENCOUNTER — Telehealth: Payer: Self-pay | Admitting: Pulmonary Disease

## 2018-12-29 NOTE — Telephone Encounter (Signed)
Patient does qualify for O2 - SpO2 less than or equal to 88% for 6 hours 20 mins during study. Please place order for 2L O2 at night. Thanks.

## 2018-12-29 NOTE — Telephone Encounter (Signed)
Called [employer] and was sent to Angelia's VM.  Left detailed message for oxygen clarification and to call back if needed.

## 2018-12-29 NOTE — Telephone Encounter (Signed)
Called and spoke with pt letting her know that based on results of ONO, she needs to do 2L O2 at bedtime. Pt expressed understanding and stated she already had O2 from previously and was told by Dr. Everardo All to keep it incase she needed it after ONO was performed. I placed an order to [employer] stating that pt needs to continue doing 2L O2 at bedtime based off of ONO results. Nothing further needed.

## 2018-12-29 NOTE — Addendum Note (Signed)
Addended by: Wyvonne Lenz on: 12/29/2018 12:26 PM   Modules accepted: Orders

## 2018-12-29 NOTE — Telephone Encounter (Addendum)
Following up on this messge to see Dr. Everardo All has not reviewed/resulted ONO.  Copy of report given to T. Tanda Rockers, NP today.  Left message on patient VM that we will call her back after provider reviews results of ONO.  Awaiting provider recommendation. Will route to triage and T.Nichos, NP for follow up

## 2019-01-01 ENCOUNTER — Telehealth: Payer: Self-pay | Admitting: Pulmonary Disease

## 2019-01-01 ENCOUNTER — Encounter: Payer: Self-pay | Admitting: Internal Medicine

## 2019-01-01 NOTE — Telephone Encounter (Signed)
Verified with Natalie Smith per order and chart patient is on 02 2L qhs. Called patient and made aware she would be contacted by [employer] to arrive time for pickup of small tanks. Nothing further needed.

## 2019-01-04 DIAGNOSIS — J441 Chronic obstructive pulmonary disease with (acute) exacerbation: Secondary | ICD-10-CM | POA: Diagnosis not present

## 2019-01-23 ENCOUNTER — Ambulatory Visit (INDEPENDENT_AMBULATORY_CARE_PROVIDER_SITE_OTHER): Payer: Medicare HMO | Admitting: Internal Medicine

## 2019-01-23 ENCOUNTER — Other Ambulatory Visit: Payer: Self-pay

## 2019-01-23 DIAGNOSIS — F329 Major depressive disorder, single episode, unspecified: Secondary | ICD-10-CM

## 2019-01-23 DIAGNOSIS — E785 Hyperlipidemia, unspecified: Secondary | ICD-10-CM | POA: Diagnosis not present

## 2019-01-23 DIAGNOSIS — E079 Disorder of thyroid, unspecified: Secondary | ICD-10-CM | POA: Diagnosis not present

## 2019-01-23 DIAGNOSIS — F32A Depression, unspecified: Secondary | ICD-10-CM

## 2019-01-23 DIAGNOSIS — J449 Chronic obstructive pulmonary disease, unspecified: Secondary | ICD-10-CM

## 2019-01-23 DIAGNOSIS — K219 Gastro-esophageal reflux disease without esophagitis: Secondary | ICD-10-CM

## 2019-01-23 DIAGNOSIS — F419 Anxiety disorder, unspecified: Secondary | ICD-10-CM | POA: Diagnosis not present

## 2019-01-23 DIAGNOSIS — R69 Illness, unspecified: Secondary | ICD-10-CM | POA: Diagnosis not present

## 2019-01-23 MED ORDER — LORAZEPAM 0.5 MG PO TABS: 0.5000 mg | ORAL_TABLET | Freq: Two times a day (BID) | ORAL | 1 refills | Status: DC | PRN

## 2019-01-23 NOTE — Progress Notes (Signed)
Virtual Visit via Telephone Note  I connected with Natalie Smith on 01/23/19 at 10:30 AM EDT by telephone and verified that I am speaking with the correct person using two identifiers.   I discussed the limitations, risks, security and privacy concerns of performing an evaluation and management service by telephone and the availability of in person appointments. I also discussed with the patient that there may be a patient responsible charge related to this service. The patient expressed understanding and agreed to proceed.  We initially attempted to connect via video chat but were unable to due to technical difficulties on the patient's end, so we converted this visit to a phone visit.   Location patient: home Location provider: work office Participants present for the call: patient, provider Patient did not have a visit in the prior 7 days to address this/these issue(s).   History of Present Illness:  This is a scheduled 3 mo follow up visit to monitor chronic medical conditions. Since I last saw her, she has started seeing a pulmonologist, Dr. Everardo AllEllison. She is now using oxygen only at nighttime. She is also only on Symbicort with PRN albuterol as a rescue. She does not feel SOB with ADLs. She needs a refill of her ativan. She uses it on average 4 times a week (her usage has increased with COVID-19 and increased anxiety; used to take it 1-2 times a week). Due for CPE in June. She has a h/o well controlled hypothyroidism, GERD and hyperlipidemia in addition to her COPD. She has no acute complaints today.   Observations/Objective: Patient sounds cheerful and well on the phone. I do not appreciate any increased work of breathing. Speech and thought processing are grossly intact. Patient reported vitals: none reported   Current Outpatient Medications:  .  albuterol (PROVENTIL HFA;VENTOLIN HFA) 108 (90 Base) MCG/ACT inhaler, Inhale 2 puffs into the lungs every 6 (six) hours as needed  for wheezing or shortness of breath., Disp: 1 Inhaler, Rfl: 2 .  budesonide-formoterol (SYMBICORT) 160-4.5 MCG/ACT inhaler, Inhale 2 puffs into the lungs 2 (two) times daily. (Patient taking differently: Inhale 2 puffs into the lungs daily. ), Disp: 1 Inhaler, Rfl: 3 .  LORazepam (ATIVAN) 0.5 MG tablet, Take 1 tablet (0.5 mg total) by mouth 2 (two) times daily as needed for anxiety., Disp: 45 tablet, Rfl: 1  Review of Systems:  Constitutional: Denies fever, chills, diaphoresis, appetite change and fatigue.  HEENT: Denies photophobia, eye pain, redness, hearing loss, ear pain, congestion, sore throat, rhinorrhea, sneezing, mouth sores, trouble swallowing, neck pain, neck stiffness and tinnitus.   Respiratory: Denies SOB, DOE, cough, chest tightness,  and wheezing.   Cardiovascular: Denies chest pain, palpitations and leg swelling.  Gastrointestinal: Denies nausea, vomiting, abdominal pain, diarrhea, constipation, blood in stool and abdominal distention.  Genitourinary: Denies dysuria, urgency, frequency, hematuria, flank pain and difficulty urinating.  Endocrine: Denies: hot or cold intolerance, sweats, changes in hair or nails, polyuria, polydipsia. Musculoskeletal: Denies myalgias, back pain, joint swelling, arthralgias and gait problem.  Skin: Denies pallor, rash and wound.  Neurological: Denies dizziness, seizures, syncope, weakness, light-headedness, numbness and headaches.  Hematological: Denies adenopathy. Easy bruising, personal or family bleeding history  Psychiatric/Behavioral: Denies suicidal ideation, mood changes, confusion, nervousness, sleep disturbance and agitation   Assessment and Plan:  Anxiety -Refill lorazepam today. -PDMP reviewed; no red flags, she  Last received 60 tabs of lorazepam in Oct 2019.  Disorder of thyroid -Last TSH 0.970 in 5/19. -Not currently on thyroid meds.  Hyperlipidemia, unspecified hyperlipidemia type -Last LDL 121 in 5/19. -Not on statin.   Anxiety and depression -Refill lorazepam today.  COPD, severe (HCC) -Compensated. -Oxygen now only at bedtime.  Gastroesophageal reflux disease without esophagitis -Controlled off meds.    I discussed the assessment and treatment plan with the patient. The patient was provided an opportunity to ask questions and all were answered. The patient agreed with the plan and demonstrated an understanding of the instructions.   The patient was advised to call back or seek an in-person evaluation if the symptoms worsen or if the condition fails to improve as anticipated.  I provided 22 minutes of non-face-to-face time during this encounter.   Chaya Jan, MD [facility]

## 2019-02-03 DIAGNOSIS — J441 Chronic obstructive pulmonary disease with (acute) exacerbation: Secondary | ICD-10-CM | POA: Diagnosis not present

## 2019-02-20 ENCOUNTER — Ambulatory Visit: Payer: Medicare HMO | Admitting: Pulmonary Disease

## 2019-02-21 ENCOUNTER — Telehealth: Payer: Self-pay | Admitting: *Deleted

## 2019-02-21 DIAGNOSIS — E785 Hyperlipidemia, unspecified: Secondary | ICD-10-CM

## 2019-02-21 DIAGNOSIS — E079 Disorder of thyroid, unspecified: Secondary | ICD-10-CM

## 2019-02-21 DIAGNOSIS — R7309 Other abnormal glucose: Secondary | ICD-10-CM

## 2019-02-21 DIAGNOSIS — M4726 Other spondylosis with radiculopathy, lumbar region: Secondary | ICD-10-CM

## 2019-02-21 DIAGNOSIS — J449 Chronic obstructive pulmonary disease, unspecified: Secondary | ICD-10-CM

## 2019-02-21 DIAGNOSIS — E559 Vitamin D deficiency, unspecified: Secondary | ICD-10-CM

## 2019-02-21 DIAGNOSIS — R5383 Other fatigue: Secondary | ICD-10-CM

## 2019-02-21 NOTE — Telephone Encounter (Signed)
Pt wanted to notify Apolonio Schneiders that she called ins and they will cover labs prior as long as they are coded as routine preventative labs.

## 2019-02-21 NOTE — Telephone Encounter (Signed)
If she wants to come in twice....ok. She needs to be fasting for labs. She could just come on her CPE day and we can draw then; her preference. CBC, CMET, TSH, lipid panel, A1c, Vit B12, Vit D.

## 2019-02-21 NOTE — Telephone Encounter (Signed)
Patient is requesting labs before her CPE.  I explained that her insurance may not cover the cost.  Patient will check with her insurance.  Okay to order labs?

## 2019-02-23 NOTE — Telephone Encounter (Signed)
Left message on machine for patient to schedule lab appointment Lab orders placed.

## 2019-03-06 DIAGNOSIS — J441 Chronic obstructive pulmonary disease with (acute) exacerbation: Secondary | ICD-10-CM | POA: Diagnosis not present

## 2019-03-08 ENCOUNTER — Encounter: Payer: Medicare HMO | Admitting: Internal Medicine

## 2019-03-21 ENCOUNTER — Other Ambulatory Visit (INDEPENDENT_AMBULATORY_CARE_PROVIDER_SITE_OTHER): Payer: Medicare HMO

## 2019-03-21 ENCOUNTER — Other Ambulatory Visit: Payer: Self-pay

## 2019-03-21 ENCOUNTER — Other Ambulatory Visit: Payer: Self-pay | Admitting: Internal Medicine

## 2019-03-21 DIAGNOSIS — E559 Vitamin D deficiency, unspecified: Secondary | ICD-10-CM

## 2019-03-21 DIAGNOSIS — R7309 Other abnormal glucose: Secondary | ICD-10-CM

## 2019-03-21 DIAGNOSIS — R5383 Other fatigue: Secondary | ICD-10-CM

## 2019-03-21 DIAGNOSIS — E785 Hyperlipidemia, unspecified: Secondary | ICD-10-CM | POA: Diagnosis not present

## 2019-03-21 DIAGNOSIS — E079 Disorder of thyroid, unspecified: Secondary | ICD-10-CM

## 2019-03-21 DIAGNOSIS — M4726 Other spondylosis with radiculopathy, lumbar region: Secondary | ICD-10-CM

## 2019-03-21 LAB — CBC WITH DIFFERENTIAL/PLATELET
Basophils Absolute: 0 10*3/uL (ref 0.0–0.1)
Basophils Relative: 0.5 % (ref 0.0–3.0)
Eosinophils Absolute: 0.1 10*3/uL (ref 0.0–0.7)
Eosinophils Relative: 1 % (ref 0.0–5.0)
HCT: 46.3 % — ABNORMAL HIGH (ref 36.0–46.0)
Hemoglobin: 15.3 g/dL — ABNORMAL HIGH (ref 12.0–15.0)
Lymphocytes Relative: 23.6 % (ref 12.0–46.0)
Lymphs Abs: 1.6 10*3/uL (ref 0.7–4.0)
MCHC: 33 g/dL (ref 30.0–36.0)
MCV: 95.8 fl (ref 78.0–100.0)
Monocytes Absolute: 0.4 10*3/uL (ref 0.1–1.0)
Monocytes Relative: 6.4 % (ref 3.0–12.0)
Neutro Abs: 4.7 10*3/uL (ref 1.4–7.7)
Neutrophils Relative %: 68.5 % (ref 43.0–77.0)
Platelets: 241 10*3/uL (ref 150.0–400.0)
RBC: 4.84 Mil/uL (ref 3.87–5.11)
RDW: 13.5 % (ref 11.5–15.5)
WBC: 6.9 10*3/uL (ref 4.0–10.5)

## 2019-03-21 LAB — LIPID PANEL
Cholesterol: 249 mg/dL — ABNORMAL HIGH (ref 0–200)
HDL: 95.4 mg/dL (ref >39.00)
LDL Cholesterol: 139 mg/dL — ABNORMAL HIGH (ref 0–99)
NonHDL: 153.89
Total CHOL/HDL Ratio: 3
Triglycerides: 75 mg/dL (ref 0.0–149.0)
VLDL: 15 mg/dL (ref 0.0–40.0)

## 2019-03-21 LAB — COMPREHENSIVE METABOLIC PANEL
ALT: 11 U/L (ref 0–35)
AST: 13 U/L (ref 0–37)
Albumin: 4.4 g/dL (ref 3.5–5.2)
Alkaline Phosphatase: 62 U/L (ref 39–117)
BUN: 9 mg/dL (ref 6–23)
CO2: 31 mEq/L (ref 19–32)
Calcium: 9 mg/dL (ref 8.4–10.5)
Chloride: 96 mEq/L (ref 96–112)
Creatinine, Ser: 0.58 mg/dL (ref 0.40–1.20)
GFR: 101.85 mL/min (ref >60.00)
Glucose, Bld: 94 mg/dL (ref 70–99)
Potassium: 3.9 mEq/L (ref 3.5–5.1)
Sodium: 135 mEq/L (ref 135–145)
Total Bilirubin: 0.5 mg/dL (ref 0.2–1.2)
Total Protein: 6.7 g/dL (ref 6.0–8.3)

## 2019-03-21 LAB — HEMOGLOBIN A1C: Hgb A1c MFr Bld: 5.4 % (ref 4.6–6.5)

## 2019-03-21 LAB — TSH: TSH: 1.12 u[IU]/mL (ref 0.35–4.50)

## 2019-03-21 LAB — VITAMIN B12: Vitamin B-12: 531 pg/mL (ref 211–911)

## 2019-03-21 LAB — VITAMIN D 25 HYDROXY (VIT D DEFICIENCY, FRACTURES): VITD: 19.43 ng/mL — ABNORMAL LOW (ref 30.00–100.00)

## 2019-03-21 MED ORDER — VITAMIN D (ERGOCALCIFEROL) 1.25 MG (50000 UNIT) PO CAPS: 50000.0000 [IU] | ORAL_CAPSULE | ORAL | 0 refills | Status: AC

## 2019-03-28 ENCOUNTER — Other Ambulatory Visit: Payer: Self-pay

## 2019-03-28 ENCOUNTER — Encounter: Payer: Self-pay | Admitting: Internal Medicine

## 2019-03-28 ENCOUNTER — Ambulatory Visit (INDEPENDENT_AMBULATORY_CARE_PROVIDER_SITE_OTHER): Payer: Medicare HMO | Admitting: Internal Medicine

## 2019-03-28 VITALS — BP 130/90 | HR 88 | Temp 98.1°F | Ht 62.5 in | Wt 155.3 lb

## 2019-03-28 DIAGNOSIS — Z1239 Encounter for other screening for malignant neoplasm of breast: Secondary | ICD-10-CM

## 2019-03-28 DIAGNOSIS — Z0000 Encounter for general adult medical examination without abnormal findings: Secondary | ICD-10-CM

## 2019-03-28 DIAGNOSIS — R030 Elevated blood-pressure reading, without diagnosis of hypertension: Secondary | ICD-10-CM

## 2019-03-28 DIAGNOSIS — K219 Gastro-esophageal reflux disease without esophagitis: Secondary | ICD-10-CM

## 2019-03-28 DIAGNOSIS — E559 Vitamin D deficiency, unspecified: Secondary | ICD-10-CM

## 2019-03-28 DIAGNOSIS — J449 Chronic obstructive pulmonary disease, unspecified: Secondary | ICD-10-CM

## 2019-03-28 DIAGNOSIS — Z1211 Encounter for screening for malignant neoplasm of colon: Secondary | ICD-10-CM | POA: Diagnosis not present

## 2019-03-28 DIAGNOSIS — E785 Hyperlipidemia, unspecified: Secondary | ICD-10-CM | POA: Diagnosis not present

## 2019-03-28 NOTE — Patient Instructions (Addendum)
-Nice seeing you today!!  -Take blood pressure 2-3 times a week at home and notify us if above 130/80 consistently.   -Schedule follow up in 6 months.   Preventive Care 8 Years and Older, Female Preventive care refers to lifestyle choices and visits with your health care provider that can promote health and wellness. This includes:  A yearly physical exam. This is also called an annual well check.  Regular dental and eye exams.  Immunizations.  Screening for certain conditions.  Healthy lifestyle choices, such as diet and exercise. What can I expect for my preventive care visit? Physical exam Your health care provider will check:  Height and weight. These may be used to calculate body mass index (BMI), which is a measurement that tells if you are at a healthy weight.  Heart rate and blood pressure.  Your skin for abnormal spots. Counseling Your health care provider may ask you questions about:  Alcohol, tobacco, and drug use.  Emotional well-being.  Home and relationship well-being.  Sexual activity.  Eating habits.  History of falls.  Memory and ability to understand (cognition).  Work and work Statistician.  Pregnancy and menstrual history. What immunizations do I need?  Influenza (flu) vaccine  This is recommended every year. Tetanus, diphtheria, and pertussis (Tdap) vaccine  You may need a Td booster every 10 years. Varicella (chickenpox) vaccine  You may need this vaccine if you have not already been vaccinated. Zoster (shingles) vaccine  You may need this after age 68. Pneumococcal conjugate (PCV13) vaccine  One dose is recommended after age 79. Pneumococcal polysaccharide (PPSV23) vaccine  One dose is recommended after age 30. Measles, mumps, and rubella (MMR) vaccine  You may need at least one dose of MMR if you were born in 1957 or later. You may also need a second dose. Meningococcal conjugate (MenACWY) vaccine  You may need this if  you have certain conditions. Hepatitis A vaccine  You may need this if you have certain conditions or if you travel or work in places where you may be exposed to hepatitis A. Hepatitis B vaccine  You may need this if you have certain conditions or if you travel or work in places where you may be exposed to hepatitis B. Haemophilus influenzae type b (Hib) vaccine  You may need this if you have certain conditions. You may receive vaccines as individual doses or as more than one vaccine together in one shot (combination vaccines). Talk with your health care provider about the risks and benefits of combination vaccines. What tests do I need? Blood tests  Lipid and cholesterol levels. These may be checked every 5 years, or more frequently depending on your overall health.  Hepatitis C test.  Hepatitis B test. Screening  Lung cancer screening. You may have this screening every year starting at age 15 if you have a 30-pack-year history of smoking and currently smoke or have quit within the past 15 years.  Colorectal cancer screening. All adults should have this screening starting at age 13 and continuing until age 15. Your health care provider may recommend screening at age 22 if you are at increased risk. You will have tests every 1-10 years, depending on your results and the type of screening test.  Diabetes screening. This is done by checking your blood sugar (glucose) after you have not eaten for a while (fasting). You may have this done every 1-3 years.  Mammogram. This may be done every 1-2 years. Talk with your health  care provider about how often you should have regular mammograms.  BRCA-related cancer screening. This may be done if you have a family history of breast, ovarian, tubal, or peritoneal cancers. Other tests  Sexually transmitted disease (STD) testing.  Bone density scan. This is done to screen for osteoporosis. You may have this done starting at age 46. Follow these  instructions at home: Eating and drinking  Eat a diet that includes fresh fruits and vegetables, whole grains, lean protein, and low-fat dairy products. Limit your intake of foods with high amounts of sugar, saturated fats, and salt.  Take vitamin and mineral supplements as recommended by your health care provider.  Do not drink alcohol if your health care provider tells you not to drink.  If you drink alcohol: ? Limit how much you have to 0-1 drink a day. ? Be aware of how much alcohol is in your drink. In the [place] one drink equals one 12 oz bottle of beer (355 mL), one 5 oz glass of wine (148 mL), or one 1 oz glass of hard liquor (44 mL). Lifestyle  Take daily care of your teeth and gums.  Stay active. Exercise for at least 30 minutes on 5 or more days each week.  Do not use any products that contain nicotine or tobacco, such as cigarettes, e-cigarettes, and chewing tobacco. If you need help quitting, ask your health care provider.  If you are sexually active, practice safe sex. Use a condom or other form of protection in order to prevent STIs (sexually transmitted infections).  Talk with your health care provider about taking a low-dose aspirin or statin. What's next?  Go to your health care provider once a year for a well check visit.  Ask your health care provider how often you should have your eyes and teeth checked.  Stay up to date on all vaccines. This information is not intended to replace advice given to you by your health care provider. Make sure you discuss any questions you have with your health care provider. Document Released: 09/19/2015 Document Revised: 08/17/2018 Document Reviewed: 08/17/2018 Elsevier Patient Education  2020 [employer].
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
## 2019-03-30 ENCOUNTER — Telehealth: Payer: Self-pay | Admitting: *Deleted

## 2019-03-30 NOTE — Telephone Encounter (Signed)
Dr Carlean Purl,  This pt is scheduled with you for a colon 8-19 Wednesday, recall- 2010 colon normal- she is on chronic night time 02 2 liters for severe COPD- no recent office visits with you   Do you want her to have an OV with you or APP or should she be direct at the hospital?  Please advise, Thanks so much for your time, Lelan Pons

## 2019-04-02 NOTE — Telephone Encounter (Signed)
Pt states she does have severe COPD and uses home 02- made OV with Dr Carlean Purl for 8-27 at [place] aware location and to wear a mask - 3rd floor cjeck in 15 minutes beofre- Pt verbalized understanding- Lelan Pons pV

## 2019-04-02 NOTE — Telephone Encounter (Signed)
You did the right thing   Thanks

## 2019-04-05 DIAGNOSIS — J441 Chronic obstructive pulmonary disease with (acute) exacerbation: Secondary | ICD-10-CM | POA: Diagnosis not present

## 2019-04-25 ENCOUNTER — Encounter: Payer: Medicare HMO | Admitting: Internal Medicine

## 2019-05-03 ENCOUNTER — Ambulatory Visit: Payer: Medicare HMO | Admitting: Internal Medicine

## 2019-05-06 DIAGNOSIS — J441 Chronic obstructive pulmonary disease with (acute) exacerbation: Secondary | ICD-10-CM | POA: Diagnosis not present

## 2019-06-06 DIAGNOSIS — J441 Chronic obstructive pulmonary disease with (acute) exacerbation: Secondary | ICD-10-CM | POA: Diagnosis not present

## 2019-07-02 ENCOUNTER — Other Ambulatory Visit: Payer: Self-pay | Admitting: Internal Medicine

## 2019-07-02 DIAGNOSIS — F419 Anxiety disorder, unspecified: Secondary | ICD-10-CM

## 2019-07-06 DIAGNOSIS — J441 Chronic obstructive pulmonary disease with (acute) exacerbation: Secondary | ICD-10-CM | POA: Diagnosis not present

## 2019-07-12 ENCOUNTER — Telehealth: Payer: Self-pay | Admitting: *Deleted

## 2019-07-12 NOTE — Telephone Encounter (Signed)
Copied from [place] [id number]. Topic: General - Inquiry >> Jul 12, 2019  8:08 AM Natalie Smith, NT wrote: Reason for CRM: Pt called in stating she has been taking OTC medications that have not done a lot of help. Patient is requesting an antibiotic as she is still dealing with chest cold and it is getting worse. Please advise.

## 2019-07-12 NOTE — Telephone Encounter (Signed)
Patient is aware of Dr Ledell Noss recommendations and agrees.

## 2019-07-12 NOTE — Telephone Encounter (Signed)
Unlikely to be a bacterial infection requiring antibiotics but hard to determine without evaluating in person. Unable to prescribe abx without seeing in person. Due to COVID guidelines, unfortunately unable to see in office. May need UC visit if she feels like symptoms are progressing despite OTC management.

## 2019-08-06 DIAGNOSIS — J441 Chronic obstructive pulmonary disease with (acute) exacerbation: Secondary | ICD-10-CM | POA: Diagnosis not present

## 2019-08-29 ENCOUNTER — Other Ambulatory Visit: Payer: Self-pay | Admitting: Internal Medicine

## 2019-08-29 DIAGNOSIS — F419 Anxiety disorder, unspecified: Secondary | ICD-10-CM

## 2019-09-05 DIAGNOSIS — J441 Chronic obstructive pulmonary disease with (acute) exacerbation: Secondary | ICD-10-CM | POA: Diagnosis not present

## 2019-09-16 DIAGNOSIS — R69 Illness, unspecified: Secondary | ICD-10-CM | POA: Diagnosis not present

## 2019-09-16 DIAGNOSIS — L509 Urticaria, unspecified: Secondary | ICD-10-CM | POA: Diagnosis not present

## 2019-09-16 DIAGNOSIS — R000 Tachycardia, unspecified: Secondary | ICD-10-CM | POA: Diagnosis not present

## 2019-09-28 ENCOUNTER — Ambulatory Visit: Payer: Medicare HMO | Attending: Internal Medicine

## 2019-09-28 DIAGNOSIS — Z23 Encounter for immunization: Secondary | ICD-10-CM | POA: Insufficient documentation

## 2019-09-28 NOTE — Progress Notes (Signed)
   Covid-19 Vaccination Clinic  Name:  Alizandra Loh    MRN: [id number] DOB: 10-29-1945  09/28/2019  Ms. Holleman was observed post Covid-19 immunization for 15 minutes without incidence. She was provided with Vaccine Information Sheet and instruction to access the V-Safe system.   Ms. Gazzola was instructed to call 911 with any severe reactions post vaccine: Marland Kitchen Difficulty breathing  . Swelling of your face and throat  . A fast heartbeat  . A bad rash all over your body  . Dizziness and weakness    Immunizations Administered    Name Date Dose VIS Date Route   Pfizer COVID-19 Vaccine 09/28/2019  3:50 PM 0.3 mL 08/17/2019 Intramuscular   Manufacturer: [employer], [employer]   Lot: [id number]   NDC: [id number]

## 2019-10-06 DIAGNOSIS — J441 Chronic obstructive pulmonary disease with (acute) exacerbation: Secondary | ICD-10-CM | POA: Diagnosis not present

## 2019-10-19 ENCOUNTER — Ambulatory Visit: Payer: Medicare HMO

## 2019-10-22 ENCOUNTER — Other Ambulatory Visit: Payer: Self-pay | Admitting: Internal Medicine

## 2019-10-22 DIAGNOSIS — F419 Anxiety disorder, unspecified: Secondary | ICD-10-CM

## 2019-10-29 ENCOUNTER — Ambulatory Visit: Payer: Medicare HMO

## 2019-11-01 ENCOUNTER — Ambulatory Visit: Payer: Medicare HMO | Attending: Internal Medicine

## 2019-11-01 DIAGNOSIS — Z23 Encounter for immunization: Secondary | ICD-10-CM | POA: Insufficient documentation

## 2019-11-01 NOTE — Progress Notes (Signed)
   Covid-19 Vaccination Clinic  Name:  Natalie Smith    MRN: [id number] DOB: [birth year]  11/01/2019  [name] was observed post Covid-19 immunization for 15 minutes without incidence. She was provided with Vaccine Information Sheet and instruction to access the V-Safe system.   [name] was instructed to call 911 with any severe reactions post vaccine: Marland Kitchen Difficulty breathing  . Swelling of your face and throat  . A fast heartbeat  . A bad rash all over your body  . Dizziness and weakness    Immunizations Administered    Name Date Dose VIS Date Route   Pfizer COVID-19 Vaccine 11/01/2019 11:08 AM 0.3 mL 08/17/2019 Intramuscular   Manufacturer: [employer], [employer]   Lot: [id number]   NDC: [address]-5

## 2019-11-04 DIAGNOSIS — J441 Chronic obstructive pulmonary disease with (acute) exacerbation: Secondary | ICD-10-CM | POA: Diagnosis not present

## 2019-11-30 ENCOUNTER — Telehealth: Payer: Self-pay | Admitting: Internal Medicine

## 2019-11-30 MED ORDER — ALBUTEROL SULFATE HFA 108 (90 BASE) MCG/ACT IN AERS: 2.0000 | INHALATION_SPRAY | Freq: Four times a day (QID) | RESPIRATORY_TRACT | 1 refills | Status: DC | PRN

## 2019-11-30 NOTE — Telephone Encounter (Signed)
Pt call want a refill on albuterol (PROVENTIL HFA;VENTOLIN sent to  [facility] - [place], [place] - [address] Phone:  [phone]  Fax:  [phone]    Pt would like a call back .

## 2019-12-04 DIAGNOSIS — J441 Chronic obstructive pulmonary disease with (acute) exacerbation: Secondary | ICD-10-CM | POA: Diagnosis not present

## 2019-12-11 ENCOUNTER — Other Ambulatory Visit: Payer: Self-pay | Admitting: Adult Health

## 2019-12-11 DIAGNOSIS — F419 Anxiety disorder, unspecified: Secondary | ICD-10-CM

## 2020-01-04 DIAGNOSIS — J441 Chronic obstructive pulmonary disease with (acute) exacerbation: Secondary | ICD-10-CM | POA: Diagnosis not present

## 2020-01-23 ENCOUNTER — Other Ambulatory Visit: Payer: Self-pay

## 2020-01-24 ENCOUNTER — Other Ambulatory Visit: Payer: Self-pay | Admitting: Internal Medicine

## 2020-01-24 ENCOUNTER — Encounter: Payer: Self-pay | Admitting: Internal Medicine

## 2020-01-24 ENCOUNTER — Ambulatory Visit (INDEPENDENT_AMBULATORY_CARE_PROVIDER_SITE_OTHER): Payer: Medicare HMO | Admitting: Internal Medicine

## 2020-01-24 VITALS — BP 130/90 | HR 80 | Temp 97.6°F | Wt 150.2 lb

## 2020-01-24 DIAGNOSIS — F17200 Nicotine dependence, unspecified, uncomplicated: Secondary | ICD-10-CM

## 2020-01-24 DIAGNOSIS — F419 Anxiety disorder, unspecified: Secondary | ICD-10-CM | POA: Diagnosis not present

## 2020-01-24 DIAGNOSIS — E079 Disorder of thyroid, unspecified: Secondary | ICD-10-CM

## 2020-01-24 DIAGNOSIS — E559 Vitamin D deficiency, unspecified: Secondary | ICD-10-CM

## 2020-01-24 DIAGNOSIS — R69 Illness, unspecified: Secondary | ICD-10-CM | POA: Diagnosis not present

## 2020-01-24 DIAGNOSIS — F329 Major depressive disorder, single episode, unspecified: Secondary | ICD-10-CM | POA: Diagnosis not present

## 2020-01-24 DIAGNOSIS — F339 Major depressive disorder, recurrent, unspecified: Secondary | ICD-10-CM

## 2020-01-24 DIAGNOSIS — F32A Depression, unspecified: Secondary | ICD-10-CM

## 2020-01-24 LAB — VITAMIN D 25 HYDROXY (VIT D DEFICIENCY, FRACTURES): VITD: 18.46 ng/mL — ABNORMAL LOW (ref 30.00–100.00)

## 2020-01-24 LAB — VITAMIN B12: Vitamin B-12: 803 pg/mL (ref 211–911)

## 2020-01-24 LAB — TSH: TSH: 1.16 u[IU]/mL (ref 0.35–4.50)

## 2020-01-24 MED ORDER — VITAMIN D (ERGOCALCIFEROL) 1.25 MG (50000 UNIT) PO CAPS: 50000.0000 [IU] | ORAL_CAPSULE | ORAL | 0 refills | Status: AC

## 2020-01-24 MED ORDER — BUPROPION HCL ER (XL) 150 MG PO TB24: 150.0000 mg | ORAL_TABLET | Freq: Every day | ORAL | 1 refills | Status: DC
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
## 2020-01-24 NOTE — Patient Instructions (Signed)
-  Nice seeing you today!!  -Lab work today; will notify you once results are available.   

## 2020-01-27 ENCOUNTER — Other Ambulatory Visit: Payer: Self-pay | Admitting: Internal Medicine

## 2020-01-27 DIAGNOSIS — F419 Anxiety disorder, unspecified: Secondary | ICD-10-CM

## 2020-02-03 DIAGNOSIS — J441 Chronic obstructive pulmonary disease with (acute) exacerbation: Secondary | ICD-10-CM | POA: Diagnosis not present

## 2020-03-03 ENCOUNTER — Other Ambulatory Visit: Payer: Self-pay | Admitting: Internal Medicine

## 2020-03-03 DIAGNOSIS — F419 Anxiety disorder, unspecified: Secondary | ICD-10-CM

## 2020-03-05 DIAGNOSIS — J441 Chronic obstructive pulmonary disease with (acute) exacerbation: Secondary | ICD-10-CM | POA: Diagnosis not present

## 2020-03-19 IMAGING — DX DG CHEST 2V
2 series · 2 of 2 positions shown · non-contrast
Comparison: 10/03/2018

CLINICAL DATA: Follow-up pneumonia

EXAM:
CHEST - 2 VIEW

[chest pa]
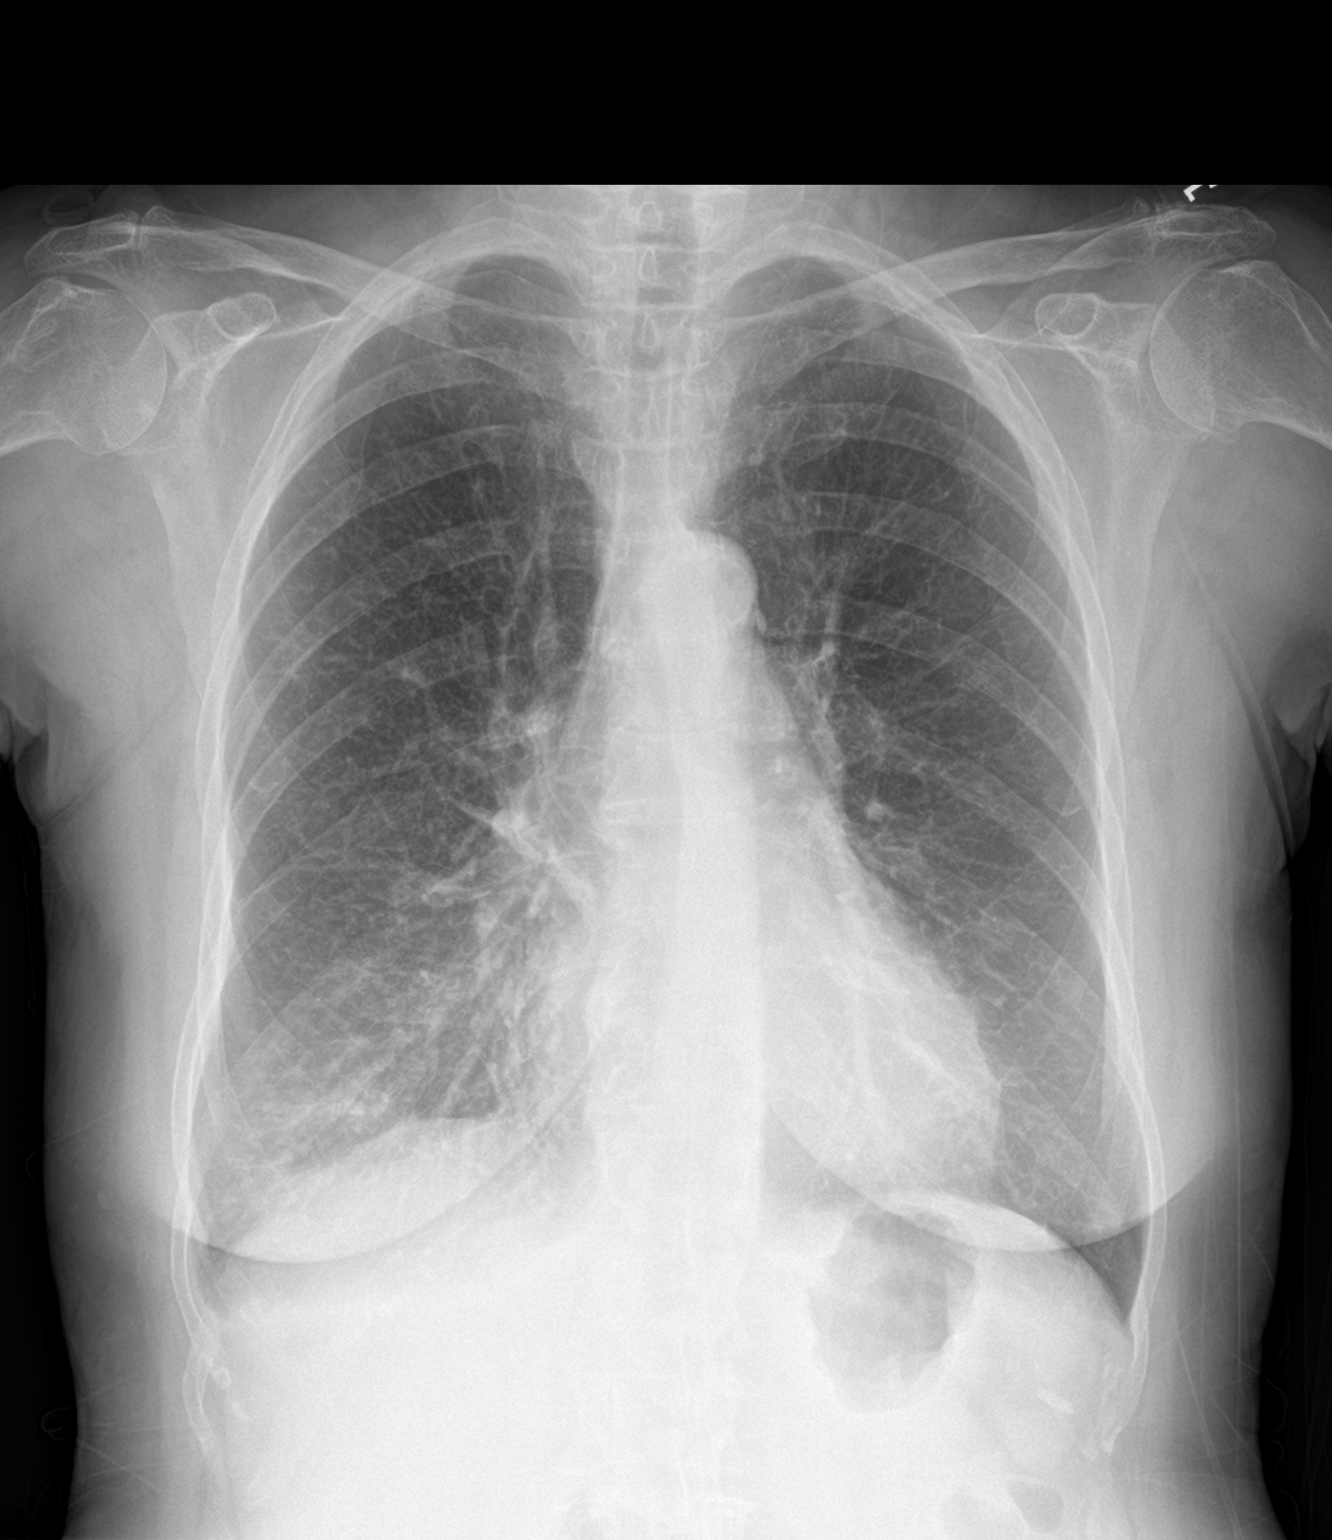

[chest lat]
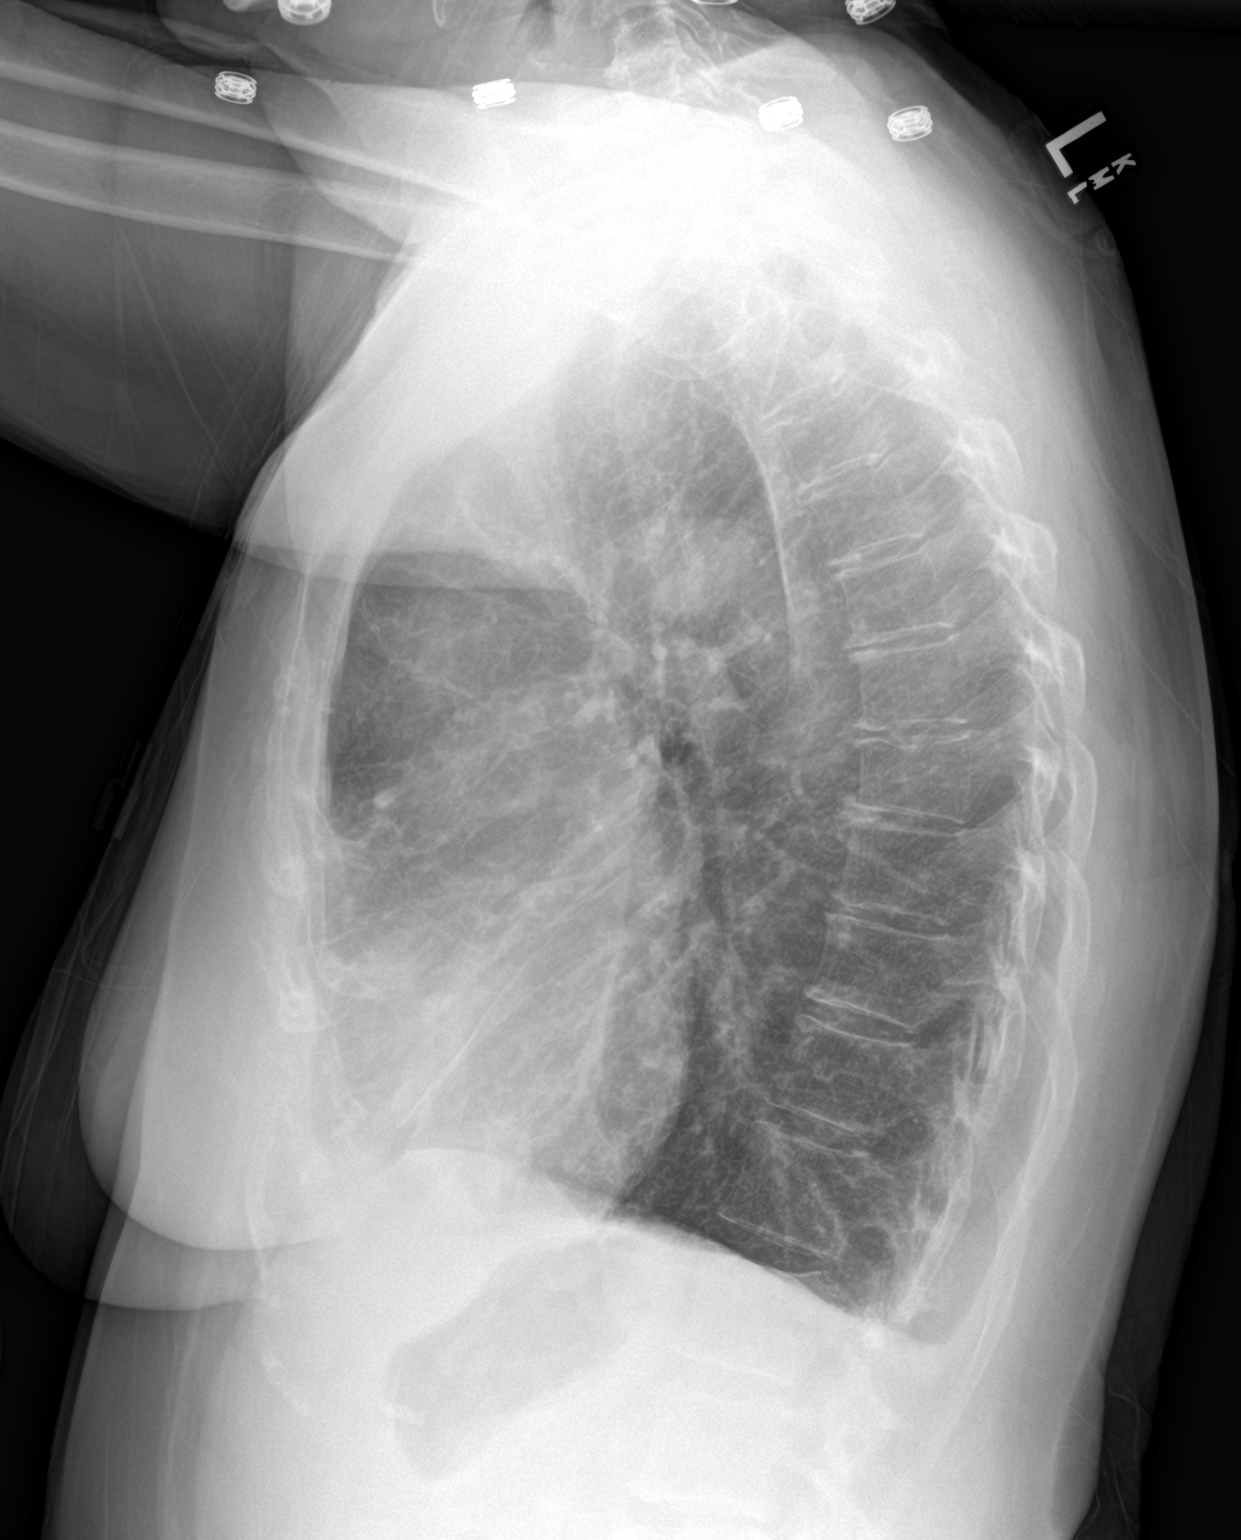

[2 of 2 positions shown; findings below may reference images not displayed]

FINDINGS: Cardiac shadow is stable. The lungs are well aerated bilaterally.
The previously seen nodular density is less well appreciated on the
current exam. Some patchy infiltrate is noted in the right base
increased from the prior study. Hyperinflation is again seen.
Degenerative change of the thoracic spine is noted.
IMPRESSION: Patchy right basilar infiltrate.

Previously seen nodule is not as well appreciated on today's exam.

## 2020-04-04 DIAGNOSIS — J441 Chronic obstructive pulmonary disease with (acute) exacerbation: Secondary | ICD-10-CM | POA: Diagnosis not present

## 2020-04-07 ENCOUNTER — Other Ambulatory Visit: Payer: Self-pay | Admitting: Internal Medicine

## 2020-04-07 DIAGNOSIS — F419 Anxiety disorder, unspecified: Secondary | ICD-10-CM

## 2020-04-25 ENCOUNTER — Ambulatory Visit (INDEPENDENT_AMBULATORY_CARE_PROVIDER_SITE_OTHER): Payer: Medicare HMO | Admitting: Internal Medicine

## 2020-04-25 ENCOUNTER — Other Ambulatory Visit: Payer: Self-pay

## 2020-04-25 ENCOUNTER — Encounter: Payer: Self-pay | Admitting: Internal Medicine

## 2020-04-25 VITALS — BP 130/80 | HR 92 | Temp 98.2°F | Ht 62.0 in | Wt 148.6 lb

## 2020-04-25 DIAGNOSIS — F339 Major depressive disorder, recurrent, unspecified: Secondary | ICD-10-CM

## 2020-04-25 DIAGNOSIS — R69 Illness, unspecified: Secondary | ICD-10-CM | POA: Diagnosis not present

## 2020-04-25 DIAGNOSIS — Z23 Encounter for immunization: Secondary | ICD-10-CM | POA: Diagnosis not present

## 2020-04-25 DIAGNOSIS — Z0000 Encounter for general adult medical examination without abnormal findings: Secondary | ICD-10-CM | POA: Diagnosis not present

## 2020-04-25 DIAGNOSIS — E785 Hyperlipidemia, unspecified: Secondary | ICD-10-CM | POA: Diagnosis not present

## 2020-04-25 DIAGNOSIS — E079 Disorder of thyroid, unspecified: Secondary | ICD-10-CM

## 2020-04-25 DIAGNOSIS — J449 Chronic obstructive pulmonary disease, unspecified: Secondary | ICD-10-CM | POA: Diagnosis not present

## 2020-04-25 DIAGNOSIS — E559 Vitamin D deficiency, unspecified: Secondary | ICD-10-CM

## 2020-04-25 DIAGNOSIS — K219 Gastro-esophageal reflux disease without esophagitis: Secondary | ICD-10-CM | POA: Diagnosis not present

## 2020-04-25 DIAGNOSIS — Z1231 Encounter for screening mammogram for malignant neoplasm of breast: Secondary | ICD-10-CM | POA: Diagnosis not present

## 2020-04-25 NOTE — Patient Instructions (Signed)
-Nice seeing you today!!  -Lab work today; will notify you once results are available.  -Pneumonia vaccine today.  -Remember your flu and shingles vaccines at your pharmacy.  -Mammogram and cologuard will be requested today.  -Make sure you schedule your eye exam.  -Schedule follow up in 1 year or sooner as needed.   Preventive Care 74 Years and Older, Female Preventive care refers to lifestyle choices and visits with your health care provider that can promote health and wellness. This includes:  A yearly physical exam. This is also called an annual well check.  Regular dental and eye exams.  Immunizations.  Screening for certain conditions.  Healthy lifestyle choices, such as diet and exercise. What can I expect for my preventive care visit? Physical exam Your health care provider will check:  Height and weight. These may be used to calculate body mass index (BMI), which is a measurement that tells if you are at a healthy weight.  Heart rate and blood pressure.  Your skin for abnormal spots. Counseling Your health care provider may ask you questions about:  Alcohol, tobacco, and drug use.  Emotional well-being.  Home and relationship well-being.  Sexual activity.  Eating habits.  History of falls.  Memory and ability to understand (cognition).  Work and work Statistician.  Pregnancy and menstrual history. What immunizations do I need?  Influenza (flu) vaccine  This is recommended every year. Tetanus, diphtheria, and pertussis (Tdap) vaccine  You may need a Td booster every 10 years. Varicella (chickenpox) vaccine  You may need this vaccine if you have not already been vaccinated. Zoster (shingles) vaccine  You may need this after age 74. Pneumococcal conjugate (PCV13) vaccine  One dose is recommended after age 74. Pneumococcal polysaccharide (PPSV23) vaccine  One dose is recommended after age 74. Measles, mumps, and rubella (MMR)  vaccine  You may need at least one dose of MMR if you were born in 1957 or later. You may also need a second dose. Meningococcal conjugate (MenACWY) vaccine  You may need this if you have certain conditions. Hepatitis A vaccine  You may need this if you have certain conditions or if you travel or work in places where you may be exposed to hepatitis A. Hepatitis B vaccine  You may need this if you have certain conditions or if you travel or work in places where you may be exposed to hepatitis B. Haemophilus influenzae type b (Hib) vaccine  You may need this if you have certain conditions. You may receive vaccines as individual doses or as more than one vaccine together in one shot (combination vaccines). Talk with your health care provider about the risks and benefits of combination vaccines. What tests do I need? Blood tests  Lipid and cholesterol levels. These may be checked every 5 years, or more frequently depending on your overall health.  Hepatitis C test.  Hepatitis B test. Screening  Lung cancer screening. You may have this screening every year starting at age 74 if you have a 30-pack-year history of smoking and currently smoke or have quit within the past 15 years.  Colorectal cancer screening. All adults should have this screening starting at age 74 and continuing until age 74. Your health care provider may recommend screening at age 27 if you are at increased risk. You will have tests every 1-10 years, depending on your results and the type of screening test.  Diabetes screening. This is done by checking your blood sugar (glucose) after you have  not eaten for a while (fasting). You may have this done every 1-3 years.  Mammogram. This may be done every 1-2 years. Talk with your health care provider about how often you should have regular mammograms.  BRCA-related cancer screening. This may be done if you have a family history of breast, ovarian, tubal, or peritoneal  cancers. Other tests  Sexually transmitted disease (STD) testing.  Bone density scan. This is done to screen for osteoporosis. You may have this done starting at age 21. Follow these instructions at home: Eating and drinking  Eat a diet that includes fresh fruits and vegetables, whole grains, lean protein, and low-fat dairy products. Limit your intake of foods with high amounts of sugar, saturated fats, and salt.  Take vitamin and mineral supplements as recommended by your health care provider.  Do not drink alcohol if your health care provider tells you not to drink.  If you drink alcohol: ? Limit how much you have to 0-1 drink a day. ? Be aware of how much alcohol is in your drink. In the [place] one drink equals one 12 oz bottle of beer (355 mL), one 5 oz glass of wine (148 mL), or one 1 oz glass of hard liquor (44 mL). Lifestyle  Take daily care of your teeth and gums.  Stay active. Exercise for at least 30 minutes on 5 or more days each week.  Do not use any products that contain nicotine or tobacco, such as cigarettes, e-cigarettes, and chewing tobacco. If you need help quitting, ask your health care provider.  If you are sexually active, practice safe sex. Use a condom or other form of protection in order to prevent STIs (sexually transmitted infections).  Talk with your health care provider about taking a low-dose aspirin or statin. What's next?  Go to your health care provider once a year for a well check visit.  Ask your health care provider how often you should have your eyes and teeth checked.  Stay up to date on all vaccines. This information is not intended to replace advice given to you by your health care provider. Make sure you discuss any questions you have with your health care provider. Document Revised: 08/17/2018 Document Reviewed: 08/17/2018 Elsevier Patient Education  2020 [employer].

## 2020-04-25 NOTE — Addendum Note (Signed)
Addended by: Kern Reap B on: 04/25/2020 12:56 PM   Modules accepted: Orders

## 2020-04-25 NOTE — Addendum Note (Signed)
Addended by: Kern Reap B on: 04/25/2020 03:42 PM   Modules accepted: Orders
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
## 2020-04-28 ENCOUNTER — Telehealth: Payer: Self-pay | Admitting: Internal Medicine

## 2020-04-28 NOTE — Telephone Encounter (Signed)
Pt stated when she spoke to Dr. Viann Fish on 8/20 she informed her that she takes gabapentin for her legs and Dr. Ardyth Harps said she had no problem refilling it. Pt does not have anything at her pharmacy and wonders if it can be called in?   Informed pt of PCP out [place] this week and her normal work days. Pt understood there may be another PCP to review this that is covering [facility] [place], [place] - [address] Phone:  [phone]  Fax:  [phone]

## 2020-04-30 ENCOUNTER — Other Ambulatory Visit: Payer: Medicare HMO

## 2020-04-30 ENCOUNTER — Other Ambulatory Visit: Payer: Self-pay

## 2020-04-30 DIAGNOSIS — R6889 Other general symptoms and signs: Secondary | ICD-10-CM | POA: Diagnosis not present

## 2020-04-30 DIAGNOSIS — Z0000 Encounter for general adult medical examination without abnormal findings: Secondary | ICD-10-CM | POA: Diagnosis not present

## 2020-04-30 DIAGNOSIS — E559 Vitamin D deficiency, unspecified: Secondary | ICD-10-CM

## 2020-04-30 DIAGNOSIS — E079 Disorder of thyroid, unspecified: Secondary | ICD-10-CM

## 2020-04-30 DIAGNOSIS — E785 Hyperlipidemia, unspecified: Secondary | ICD-10-CM

## 2020-04-30 DIAGNOSIS — J449 Chronic obstructive pulmonary disease, unspecified: Secondary | ICD-10-CM

## 2020-05-01 LAB — CBC WITH DIFFERENTIAL/PLATELET
Absolute Monocytes: 338 cells/uL (ref 200–950)
Basophils Absolute: 28 cells/uL (ref 0–200)
Basophils Relative: 0.6 %
Eosinophils Absolute: 38 cells/uL (ref 15–500)
Eosinophils Relative: 0.8 %
HCT: 47.2 % — ABNORMAL HIGH (ref 35.0–45.0)
Hemoglobin: 16.1 g/dL — ABNORMAL HIGH (ref 11.7–15.5)
Lymphs Abs: 1208 cells/uL (ref 850–3900)
MCH: 32.7 pg (ref 27.0–33.0)
MCHC: 34.1 g/dL (ref 32.0–36.0)
MCV: 95.7 fL (ref 80.0–100.0)
MPV: 9.6 fL (ref 7.5–12.5)
Monocytes Relative: 7.2 %
Neutro Abs: 3088 cells/uL (ref 1500–7800)
Neutrophils Relative %: 65.7 %
Platelets: 262 10*3/uL (ref 140–400)
RBC: 4.93 10*6/uL (ref 3.80–5.10)
RDW: 11.6 % (ref 11.0–15.0)
Total Lymphocyte: 25.7 %
WBC: 4.7 10*3/uL (ref 3.8–10.8)

## 2020-05-01 LAB — TSH: TSH: 0.67 mIU/L (ref 0.40–4.50)

## 2020-05-01 LAB — HEMOGLOBIN A1C
Hgb A1c MFr Bld: 5.2 % of total Hgb (ref <5.7)
Mean Plasma Glucose: 103 (calc)
eAG (mmol/L): 5.7 (calc)

## 2020-05-01 LAB — COMPREHENSIVE METABOLIC PANEL
AG Ratio: 2.1 (calc) (ref 1.0–2.5)
ALT: 15 U/L (ref 6–29)
AST: 15 U/L (ref 10–35)
Albumin: 4.2 g/dL (ref 3.6–5.1)
Alkaline phosphatase (APISO): 62 U/L (ref 37–153)
BUN/Creatinine Ratio: 11 (calc) (ref 6–22)
BUN: 6 mg/dL — ABNORMAL LOW (ref 7–25)
CO2: 29 mmol/L (ref 20–32)
Calcium: 9.2 mg/dL (ref 8.6–10.4)
Chloride: 97 mmol/L — ABNORMAL LOW (ref 98–110)
Creat: 0.57 mg/dL — ABNORMAL LOW (ref 0.60–0.93)
Globulin: 2 g/dL (calc) (ref 1.9–3.7)
Glucose, Bld: 107 mg/dL — ABNORMAL HIGH (ref 65–99)
Potassium: 4.5 mmol/L (ref 3.5–5.3)
Sodium: 135 mmol/L (ref 135–146)
Total Bilirubin: 0.5 mg/dL (ref 0.2–1.2)
Total Protein: 6.2 g/dL (ref 6.1–8.1)

## 2020-05-01 LAB — VITAMIN B12: Vitamin B-12: 418 pg/mL (ref 200–1100)

## 2020-05-01 LAB — LIPID PANEL
Cholesterol: 241 mg/dL — ABNORMAL HIGH (ref <200)
HDL: 96 mg/dL (ref >=50)
LDL Cholesterol (Calc): 124 mg/dL (calc) — ABNORMAL HIGH
Non-HDL Cholesterol (Calc): 145 mg/dL (calc) — ABNORMAL HIGH (ref <130)
Total CHOL/HDL Ratio: 2.5 (calc) (ref <5.0)
Triglycerides: 105 mg/dL (ref <150)

## 2020-05-01 LAB — VITAMIN D 25 HYDROXY (VIT D DEFICIENCY, FRACTURES): Vit D, 25-Hydroxy: 52 ng/mL (ref 30–100)

## 2020-05-05 ENCOUNTER — Telehealth: Payer: Self-pay | Admitting: Internal Medicine

## 2020-05-05 DIAGNOSIS — J441 Chronic obstructive pulmonary disease with (acute) exacerbation: Secondary | ICD-10-CM | POA: Diagnosis not present

## 2020-05-05 NOTE — Telephone Encounter (Signed)
Pt is calling to see if she can get a prescription for Gabapentin sent to   [facility] [place] - [place], [place] - [address]  [address], [place] [address]  Phone:  [phone] Fax:  [phone]   Please advise

## 2020-05-06 ENCOUNTER — Other Ambulatory Visit: Payer: Self-pay | Admitting: Internal Medicine

## 2020-05-06 DIAGNOSIS — F419 Anxiety disorder, unspecified: Secondary | ICD-10-CM

## 2020-05-07 NOTE — Telephone Encounter (Signed)
Yes

## 2020-05-07 NOTE — Telephone Encounter (Signed)
Has she taken this in the past? If so, what dose?

## 2020-05-07 NOTE — Telephone Encounter (Signed)
Spoke with patient and she was taking  Gabapentin 100 mg 2 tablets twice daily.  Okay to fill?

## 2020-05-08 ENCOUNTER — Other Ambulatory Visit: Payer: Self-pay | Admitting: Internal Medicine

## 2020-05-08 DIAGNOSIS — E785 Hyperlipidemia, unspecified: Secondary | ICD-10-CM

## 2020-05-08 MED ORDER — GABAPENTIN 100 MG PO CAPS: ORAL_CAPSULE | ORAL | 5 refills | Status: DC

## 2020-05-08 NOTE — Telephone Encounter (Signed)
Rx sent 

## 2020-05-08 NOTE — Addendum Note (Signed)
Addended by: Kern Reap B on: 05/08/2020 07:36 AM   Modules accepted: Orders

## 2020-05-14 ENCOUNTER — Telehealth: Payer: Self-pay | Admitting: *Deleted

## 2020-05-14 NOTE — Telephone Encounter (Signed)
Prior Auth started for  Gabapentin 100 mg Key: [id number])

## 2020-05-15 ENCOUNTER — Other Ambulatory Visit: Payer: Self-pay

## 2020-05-15 ENCOUNTER — Ambulatory Visit
Admission: RE | Admit: 2020-05-15 | Discharge: 2020-05-15 | Disposition: A | Discharge status: Home / Self Care | Payer: Medicare HMO | Source: Ambulatory Visit | Attending: Internal Medicine | Admitting: Internal Medicine

## 2020-05-15 DIAGNOSIS — Z1231 Encounter for screening mammogram for malignant neoplasm of breast: Secondary | ICD-10-CM | POA: Diagnosis not present

## 2020-05-15 NOTE — Telephone Encounter (Signed)
PA approved 09/07/19 - 09/05/20.

## 2020-05-19 ENCOUNTER — Other Ambulatory Visit: Payer: Self-pay | Admitting: Internal Medicine

## 2020-05-19 DIAGNOSIS — R928 Other abnormal and inconclusive findings on diagnostic imaging of breast: Secondary | ICD-10-CM

## 2020-05-27 DIAGNOSIS — Z1212 Encounter for screening for malignant neoplasm of rectum: Secondary | ICD-10-CM | POA: Diagnosis not present

## 2020-05-27 DIAGNOSIS — Z1211 Encounter for screening for malignant neoplasm of colon: Secondary | ICD-10-CM | POA: Diagnosis not present

## 2020-05-27 LAB — COLOGUARD: Cologuard: NEGATIVE

## 2020-05-30 ENCOUNTER — Other Ambulatory Visit: Payer: Self-pay | Admitting: Internal Medicine

## 2020-05-30 ENCOUNTER — Other Ambulatory Visit: Payer: Self-pay

## 2020-05-30 ENCOUNTER — Ambulatory Visit
Admission: RE | Admit: 2020-05-30 | Discharge: 2020-05-30 | Disposition: A | Discharge status: Home / Self Care | Payer: Medicare HMO | Source: Ambulatory Visit | Attending: Internal Medicine | Admitting: Internal Medicine

## 2020-05-30 ENCOUNTER — Ambulatory Visit: Payer: Medicare HMO

## 2020-05-30 DIAGNOSIS — R921 Mammographic calcification found on diagnostic imaging of breast: Secondary | ICD-10-CM | POA: Diagnosis not present

## 2020-05-30 DIAGNOSIS — R928 Other abnormal and inconclusive findings on diagnostic imaging of breast: Secondary | ICD-10-CM

## 2020-05-30 LAB — COLOGUARD: COLOGUARD: NEGATIVE

## 2020-06-05 ENCOUNTER — Other Ambulatory Visit: Payer: Self-pay | Admitting: Internal Medicine

## 2020-06-05 DIAGNOSIS — J441 Chronic obstructive pulmonary disease with (acute) exacerbation: Secondary | ICD-10-CM | POA: Diagnosis not present

## 2020-06-05 DIAGNOSIS — F419 Anxiety disorder, unspecified: Secondary | ICD-10-CM

## 2020-06-09 ENCOUNTER — Encounter: Payer: Self-pay | Admitting: Internal Medicine

## 2020-06-10 ENCOUNTER — Encounter: Payer: Self-pay | Admitting: *Deleted

## 2020-07-05 DIAGNOSIS — J441 Chronic obstructive pulmonary disease with (acute) exacerbation: Secondary | ICD-10-CM | POA: Diagnosis not present

## 2020-07-07 ENCOUNTER — Other Ambulatory Visit: Payer: Self-pay | Admitting: Internal Medicine

## 2020-07-07 DIAGNOSIS — F419 Anxiety disorder, unspecified: Secondary | ICD-10-CM

## 2020-08-05 DIAGNOSIS — J441 Chronic obstructive pulmonary disease with (acute) exacerbation: Secondary | ICD-10-CM | POA: Diagnosis not present

## 2020-08-11 ENCOUNTER — Other Ambulatory Visit: Payer: Self-pay | Admitting: Internal Medicine

## 2020-08-11 DIAGNOSIS — F419 Anxiety disorder, unspecified: Secondary | ICD-10-CM

## 2020-09-04 DIAGNOSIS — J441 Chronic obstructive pulmonary disease with (acute) exacerbation: Secondary | ICD-10-CM | POA: Diagnosis not present

## 2020-09-09 ENCOUNTER — Telehealth (INDEPENDENT_AMBULATORY_CARE_PROVIDER_SITE_OTHER): Payer: Medicare Other | Admitting: Family Medicine

## 2020-09-09 DIAGNOSIS — R059 Cough, unspecified: Secondary | ICD-10-CM

## 2020-09-09 DIAGNOSIS — R0981 Nasal congestion: Secondary | ICD-10-CM

## 2020-09-09 MED ORDER — BENZONATATE 100 MG PO CAPS: 100.0000 mg | ORAL_CAPSULE | Freq: Three times a day (TID) | ORAL | 0 refills | Status: DC | PRN

## 2020-09-09 NOTE — Patient Instructions (Addendum)
  HOME CARE TIPS:  -[place] COVID19 testing information: [email or web address] OR [phone] Most pharmacies also offer testing and home test kits.  -I sent the medication(s) we discussed to your pharmacy: Meds ordered this encounter  Medications  . benzonatate (TESSALON PERLES) 100 MG capsule    Sig: Take 1 capsule (100 mg total) by mouth 3 (three) times daily as needed.    Dispense:  20 capsule    Refill:  0    -Use your inhaler if needed per instructions  -can use tylenol or aleve if needed for fevers, aches and pains per instructions  -can use nasal saline a few times per day if nasal congestion  -stay hydrated, drink plenty of fluids and eat small healthy meals - avoid dairy  -can take 1000 IU Vit D3 and Vit C lozenges per instructions  -If the Covid test is positive, check out the CDC website for more information on home care, transmission and treatment for COVID19  -follow up with your doctor in 2-3 days unless improving and feeling better  -stay home while sick, except to seek medical care, and if you have COVID19 please stay home for a full 10 days since the onset of symptoms PLUS one day of no fever and feeling better.  It was nice to meet you today, and I really hope you are feeling better soon. I help [facility] out with telemedicine visits on Tuesdays and Thursdays and am available for visits on those days. If you have any concerns or questions following this visit please schedule a follow up visit with your Primary Care doctor or seek care at a local urgent care clinic to avoid delays in care.    Seek in person care promptly if your symptoms worsen, new concerns arise or you are not improving with treatment. Call 911 and/or seek emergency care if you symptoms are severe or life threatening.
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
## 2020-09-16 ENCOUNTER — Other Ambulatory Visit: Payer: Self-pay | Admitting: Internal Medicine

## 2020-11-10 ENCOUNTER — Other Ambulatory Visit: Payer: Self-pay | Admitting: Internal Medicine

## 2020-11-10 DIAGNOSIS — F419 Anxiety disorder, unspecified: Secondary | ICD-10-CM

## 2020-11-28 ENCOUNTER — Other Ambulatory Visit: Payer: Self-pay

## 2020-11-28 ENCOUNTER — Other Ambulatory Visit: Payer: Self-pay | Admitting: Internal Medicine

## 2020-11-28 ENCOUNTER — Ambulatory Visit
Admission: RE | Admit: 2020-11-28 | Discharge: 2020-11-28 | Disposition: A | Discharge status: Home / Self Care | Payer: Medicare Other | Source: Ambulatory Visit | Attending: Internal Medicine | Admitting: Internal Medicine

## 2020-11-28 DIAGNOSIS — R921 Mammographic calcification found on diagnostic imaging of breast: Secondary | ICD-10-CM

## 2020-11-28 DIAGNOSIS — R922 Inconclusive mammogram: Secondary | ICD-10-CM | POA: Diagnosis not present

## 2020-12-03 DIAGNOSIS — J441 Chronic obstructive pulmonary disease with (acute) exacerbation: Secondary | ICD-10-CM | POA: Diagnosis not present

## 2020-12-10 ENCOUNTER — Other Ambulatory Visit: Payer: Self-pay | Admitting: Internal Medicine

## 2020-12-10 DIAGNOSIS — F419 Anxiety disorder, unspecified: Secondary | ICD-10-CM

## 2021-01-03 DIAGNOSIS — J441 Chronic obstructive pulmonary disease with (acute) exacerbation: Secondary | ICD-10-CM | POA: Diagnosis not present

## 2021-01-12 ENCOUNTER — Encounter: Payer: Self-pay | Admitting: Internal Medicine

## 2021-01-12 DIAGNOSIS — R519 Headache, unspecified: Secondary | ICD-10-CM | POA: Diagnosis not present

## 2021-01-12 DIAGNOSIS — H18832 Recurrent erosion of cornea, left eye: Secondary | ICD-10-CM | POA: Diagnosis not present

## 2021-01-12 DIAGNOSIS — H524 Presbyopia: Secondary | ICD-10-CM | POA: Diagnosis not present

## 2021-01-12 DIAGNOSIS — H26493 Other secondary cataract, bilateral: Secondary | ICD-10-CM | POA: Diagnosis not present

## 2021-01-12 DIAGNOSIS — H04123 Dry eye syndrome of bilateral lacrimal glands: Secondary | ICD-10-CM | POA: Diagnosis not present

## 2021-01-21 ENCOUNTER — Other Ambulatory Visit: Payer: Self-pay | Admitting: Internal Medicine

## 2021-01-21 DIAGNOSIS — F419 Anxiety disorder, unspecified: Secondary | ICD-10-CM

## 2021-02-02 DIAGNOSIS — J441 Chronic obstructive pulmonary disease with (acute) exacerbation: Secondary | ICD-10-CM | POA: Diagnosis not present

## 2021-03-05 DIAGNOSIS — J441 Chronic obstructive pulmonary disease with (acute) exacerbation: Secondary | ICD-10-CM | POA: Diagnosis not present

## 2021-04-04 DIAGNOSIS — J441 Chronic obstructive pulmonary disease with (acute) exacerbation: Secondary | ICD-10-CM | POA: Diagnosis not present

## 2021-04-25 ENCOUNTER — Other Ambulatory Visit: Payer: Self-pay | Admitting: Internal Medicine

## 2021-04-25 DIAGNOSIS — F419 Anxiety disorder, unspecified: Secondary | ICD-10-CM

## 2021-05-01 ENCOUNTER — Encounter: Payer: Medicare Other | Admitting: Internal Medicine

## 2021-05-01 ENCOUNTER — Other Ambulatory Visit: Payer: Self-pay

## 2021-05-05 DIAGNOSIS — J441 Chronic obstructive pulmonary disease with (acute) exacerbation: Secondary | ICD-10-CM | POA: Diagnosis not present

## 2021-06-01 ENCOUNTER — Other Ambulatory Visit: Payer: Self-pay

## 2021-06-01 ENCOUNTER — Ambulatory Visit
Admission: RE | Admit: 2021-06-01 | Discharge: 2021-06-01 | Disposition: A | Discharge status: Home / Self Care | Payer: Medicare Other | Source: Ambulatory Visit | Attending: Internal Medicine | Admitting: Internal Medicine

## 2021-06-01 DIAGNOSIS — R921 Mammographic calcification found on diagnostic imaging of breast: Secondary | ICD-10-CM | POA: Diagnosis not present

## 2021-06-01 DIAGNOSIS — R922 Inconclusive mammogram: Secondary | ICD-10-CM | POA: Diagnosis not present

## 2021-06-03 ENCOUNTER — Other Ambulatory Visit: Payer: Self-pay | Admitting: Internal Medicine

## 2021-06-03 DIAGNOSIS — F419 Anxiety disorder, unspecified: Secondary | ICD-10-CM

## 2021-06-05 DIAGNOSIS — J441 Chronic obstructive pulmonary disease with (acute) exacerbation: Secondary | ICD-10-CM | POA: Diagnosis not present

## 2021-06-11 ENCOUNTER — Other Ambulatory Visit: Payer: Self-pay

## 2021-06-12 ENCOUNTER — Encounter: Payer: Self-pay | Admitting: Internal Medicine

## 2021-06-12 ENCOUNTER — Other Ambulatory Visit: Payer: Self-pay

## 2021-06-12 ENCOUNTER — Ambulatory Visit (INDEPENDENT_AMBULATORY_CARE_PROVIDER_SITE_OTHER): Payer: Medicare Other | Admitting: Internal Medicine

## 2021-06-12 ENCOUNTER — Ambulatory Visit (INDEPENDENT_AMBULATORY_CARE_PROVIDER_SITE_OTHER): Payer: Medicare Other

## 2021-06-12 VITALS — BP 140/90 | HR 89 | Temp 97.9°F | Ht 62.0 in | Wt 139.5 lb

## 2021-06-12 DIAGNOSIS — J449 Chronic obstructive pulmonary disease, unspecified: Secondary | ICD-10-CM | POA: Diagnosis not present

## 2021-06-12 DIAGNOSIS — J440 Chronic obstructive pulmonary disease with acute lower respiratory infection: Secondary | ICD-10-CM | POA: Diagnosis not present

## 2021-06-12 DIAGNOSIS — F17210 Nicotine dependence, cigarettes, uncomplicated: Secondary | ICD-10-CM | POA: Diagnosis not present

## 2021-06-12 DIAGNOSIS — J209 Acute bronchitis, unspecified: Secondary | ICD-10-CM | POA: Diagnosis not present

## 2021-06-12 DIAGNOSIS — R059 Cough, unspecified: Secondary | ICD-10-CM | POA: Diagnosis not present

## 2021-06-12 MED ORDER — AZITHROMYCIN 250 MG PO TABS: ORAL_TABLET | ORAL | 0 refills | Status: AC

## 2021-06-12 MED ORDER — FLUTICASONE-SALMETEROL 250-50 MCG/ACT IN AEPB: 1.0000 | INHALATION_SPRAY | Freq: Two times a day (BID) | RESPIRATORY_TRACT | 3 refills | Status: DC

## 2021-06-12 MED ORDER — SPIRIVA RESPIMAT 2.5 MCG/ACT IN AERS: 2.0000 | INHALATION_SPRAY | Freq: Every day | RESPIRATORY_TRACT | 3 refills | Status: DC

## 2021-06-12 NOTE — Patient Instructions (Addendum)
-  Nice seeing you today!!  -Start zpak for 5 days as directed.  -Start Advair 1 puff twice daily.  -Start spiriva 2 puffs daily.  -Xray today.  -Referral to pulmonologist.
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
## 2021-06-24 ENCOUNTER — Telehealth: Payer: Self-pay | Admitting: Internal Medicine

## 2021-06-24 MED ORDER — BUPROPION HCL ER (XL) 150 MG PO TB24: 150.0000 mg | ORAL_TABLET | Freq: Every day | ORAL | 0 refills | Status: DC

## 2021-06-24 NOTE — Telephone Encounter (Signed)
Left detailed message on machine for patient that a Rx has been sent and she should call back and schedule an appointment.

## 2021-06-24 NOTE — Telephone Encounter (Addendum)
Pt seen dr Ardyth Harps on 06-12-2021 and they per pt discuss something to help pt stop smoking. Pt is calling and would like something to stop  smoking sensation something strong . Pt has been smoking for years   [employer] [phone] - [place], [place] - [address] AT [facility] OF [address] Phone:  [phone]  Fax:  [phone]

## 2021-06-24 NOTE — Addendum Note (Signed)
Addended by: Kern Reap B on: 06/24/2021 03:36 PM   Modules accepted: Orders

## 2021-07-05 DIAGNOSIS — J441 Chronic obstructive pulmonary disease with (acute) exacerbation: Secondary | ICD-10-CM | POA: Diagnosis not present

## 2021-07-08 ENCOUNTER — Other Ambulatory Visit: Payer: Self-pay | Admitting: Internal Medicine

## 2021-07-08 DIAGNOSIS — F419 Anxiety disorder, unspecified: Secondary | ICD-10-CM

## 2021-08-05 DIAGNOSIS — J441 Chronic obstructive pulmonary disease with (acute) exacerbation: Secondary | ICD-10-CM | POA: Diagnosis not present

## 2021-08-17 ENCOUNTER — Other Ambulatory Visit: Payer: Self-pay | Admitting: Internal Medicine

## 2021-08-17 DIAGNOSIS — F419 Anxiety disorder, unspecified: Secondary | ICD-10-CM

## 2021-09-04 DIAGNOSIS — J441 Chronic obstructive pulmonary disease with (acute) exacerbation: Secondary | ICD-10-CM | POA: Diagnosis not present

## 2021-09-11 DIAGNOSIS — M25562 Pain in left knee: Secondary | ICD-10-CM | POA: Diagnosis not present

## 2021-09-11 DIAGNOSIS — M25561 Pain in right knee: Secondary | ICD-10-CM | POA: Diagnosis not present

## 2021-09-11 DIAGNOSIS — M1711 Unilateral primary osteoarthritis, right knee: Secondary | ICD-10-CM | POA: Diagnosis not present

## 2021-09-11 DIAGNOSIS — M1712 Unilateral primary osteoarthritis, left knee: Secondary | ICD-10-CM | POA: Diagnosis not present

## 2021-09-14 ENCOUNTER — Ambulatory Visit: Payer: Medicare Other | Admitting: Internal Medicine

## 2021-09-18 ENCOUNTER — Other Ambulatory Visit: Payer: Self-pay | Admitting: Internal Medicine

## 2021-10-05 ENCOUNTER — Other Ambulatory Visit: Payer: Self-pay | Admitting: Internal Medicine

## 2021-10-05 DIAGNOSIS — F419 Anxiety disorder, unspecified: Secondary | ICD-10-CM

## 2021-10-26 ENCOUNTER — Ambulatory Visit (INDEPENDENT_AMBULATORY_CARE_PROVIDER_SITE_OTHER): Payer: Medicare Other | Admitting: Internal Medicine

## 2021-10-26 ENCOUNTER — Encounter: Payer: Self-pay | Admitting: Internal Medicine

## 2021-10-26 VITALS — BP 140/84 | HR 86 | Temp 97.9°F | Ht 62.0 in | Wt 141.6 lb

## 2021-10-26 DIAGNOSIS — Z13820 Encounter for screening for osteoporosis: Secondary | ICD-10-CM

## 2021-10-26 DIAGNOSIS — E559 Vitamin D deficiency, unspecified: Secondary | ICD-10-CM

## 2021-10-26 DIAGNOSIS — F17200 Nicotine dependence, unspecified, uncomplicated: Secondary | ICD-10-CM | POA: Diagnosis not present

## 2021-10-26 DIAGNOSIS — E079 Disorder of thyroid, unspecified: Secondary | ICD-10-CM | POA: Diagnosis not present

## 2021-10-26 DIAGNOSIS — Z0000 Encounter for general adult medical examination without abnormal findings: Secondary | ICD-10-CM

## 2021-10-26 DIAGNOSIS — F339 Major depressive disorder, recurrent, unspecified: Secondary | ICD-10-CM | POA: Diagnosis not present

## 2021-10-26 DIAGNOSIS — Z780 Asymptomatic menopausal state: Secondary | ICD-10-CM | POA: Diagnosis not present

## 2021-10-26 DIAGNOSIS — Z122 Encounter for screening for malignant neoplasm of respiratory organs: Secondary | ICD-10-CM

## 2021-10-26 DIAGNOSIS — E785 Hyperlipidemia, unspecified: Secondary | ICD-10-CM | POA: Diagnosis not present

## 2021-10-26 LAB — CBC WITH DIFFERENTIAL/PLATELET
Basophils Absolute: 0 10*3/uL (ref 0.0–0.1)
Basophils Relative: 0.5 % (ref 0.0–3.0)
Eosinophils Absolute: 0.1 10*3/uL (ref 0.0–0.7)
Eosinophils Relative: 1.3 % (ref 0.0–5.0)
HCT: 46 % (ref 36.0–46.0)
Hemoglobin: 15.3 g/dL — ABNORMAL HIGH (ref 12.0–15.0)
Lymphocytes Relative: 13.4 % (ref 12.0–46.0)
Lymphs Abs: 0.9 10*3/uL (ref 0.7–4.0)
MCHC: 33.3 g/dL (ref 30.0–36.0)
MCV: 96.3 fl (ref 78.0–100.0)
Monocytes Absolute: 0.5 10*3/uL (ref 0.1–1.0)
Monocytes Relative: 7.1 % (ref 3.0–12.0)
Neutro Abs: 5.2 10*3/uL (ref 1.4–7.7)
Neutrophils Relative %: 77.7 % — ABNORMAL HIGH (ref 43.0–77.0)
Platelets: 259 10*3/uL (ref 150.0–400.0)
RBC: 4.78 Mil/uL (ref 3.87–5.11)
RDW: 13.1 % (ref 11.5–15.5)
WBC: 6.7 10*3/uL (ref 4.0–10.5)

## 2021-10-26 LAB — COMPREHENSIVE METABOLIC PANEL
ALT: 25 U/L (ref 0–35)
AST: 22 U/L (ref 0–37)
Albumin: 4.3 g/dL (ref 3.5–5.2)
Alkaline Phosphatase: 77 U/L (ref 39–117)
BUN: 15 mg/dL (ref 6–23)
CO2: 36 mEq/L — ABNORMAL HIGH (ref 19–32)
Calcium: 9.9 mg/dL (ref 8.4–10.5)
Chloride: 92 mEq/L — ABNORMAL LOW (ref 96–112)
Creatinine, Ser: 0.66 mg/dL (ref 0.40–1.20)
GFR: 85.59 mL/min (ref >60.00)
Glucose, Bld: 88 mg/dL (ref 70–99)
Potassium: 4.2 mEq/L (ref 3.5–5.1)
Sodium: 132 mEq/L — ABNORMAL LOW (ref 135–145)
Total Bilirubin: 0.5 mg/dL (ref 0.2–1.2)
Total Protein: 7.8 g/dL (ref 6.0–8.3)

## 2021-10-26 LAB — LIPID PANEL
Cholesterol: 236 mg/dL — ABNORMAL HIGH (ref 0–200)
HDL: 100.6 mg/dL (ref >39.00)
LDL Cholesterol: 118 mg/dL — ABNORMAL HIGH (ref 0–99)
NonHDL: 135.87
Total CHOL/HDL Ratio: 2
Triglycerides: 91 mg/dL (ref 0.0–149.0)
VLDL: 18.2 mg/dL (ref 0.0–40.0)

## 2021-10-26 LAB — VITAMIN D 25 HYDROXY (VIT D DEFICIENCY, FRACTURES): VITD: 18.73 ng/mL — ABNORMAL LOW (ref 30.00–100.00)

## 2021-10-26 LAB — TSH: TSH: 0.73 u[IU]/mL (ref 0.35–5.50)
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
## 2021-10-26 NOTE — Patient Instructions (Signed)
-  Nice seeing you today!!  -Lab work today; will notify you once results are available.  -Remember your shingles vaccines.  -Schedule follow up in 6 months or sooner as needed.

## 2021-11-02 ENCOUNTER — Ambulatory Visit (INDEPENDENT_AMBULATORY_CARE_PROVIDER_SITE_OTHER)
Admission: RE | Admit: 2021-11-02 | Discharge: 2021-11-02 | Disposition: A | Discharge status: Home / Self Care | Payer: Medicare Other | Source: Ambulatory Visit | Attending: Internal Medicine | Admitting: Internal Medicine

## 2021-11-02 ENCOUNTER — Other Ambulatory Visit: Payer: Self-pay

## 2021-11-02 DIAGNOSIS — Z13820 Encounter for screening for osteoporosis: Secondary | ICD-10-CM

## 2021-11-02 DIAGNOSIS — Z780 Asymptomatic menopausal state: Secondary | ICD-10-CM

## 2021-11-04 ENCOUNTER — Other Ambulatory Visit: Payer: Self-pay | Admitting: Internal Medicine

## 2021-11-04 DIAGNOSIS — E559 Vitamin D deficiency, unspecified: Secondary | ICD-10-CM

## 2021-11-04 MED ORDER — VITAMIN D (ERGOCALCIFEROL) 1.25 MG (50000 UNIT) PO CAPS: 50000.0000 [IU] | ORAL_CAPSULE | ORAL | 0 refills | Status: AC

## 2021-11-05 ENCOUNTER — Other Ambulatory Visit: Payer: Self-pay | Admitting: Internal Medicine

## 2021-11-05 DIAGNOSIS — E559 Vitamin D deficiency, unspecified: Secondary | ICD-10-CM

## 2021-11-05 DIAGNOSIS — E785 Hyperlipidemia, unspecified: Secondary | ICD-10-CM

## 2021-11-09 ENCOUNTER — Other Ambulatory Visit: Payer: Self-pay | Admitting: Internal Medicine

## 2021-12-03 DIAGNOSIS — J441 Chronic obstructive pulmonary disease with (acute) exacerbation: Secondary | ICD-10-CM | POA: Diagnosis not present

## 2021-12-08 ENCOUNTER — Other Ambulatory Visit: Payer: Self-pay | Admitting: Internal Medicine

## 2021-12-08 DIAGNOSIS — F419 Anxiety disorder, unspecified: Secondary | ICD-10-CM

## 2021-12-26 ENCOUNTER — Other Ambulatory Visit: Payer: Self-pay | Admitting: Internal Medicine

## 2021-12-26 DIAGNOSIS — J449 Chronic obstructive pulmonary disease, unspecified: Secondary | ICD-10-CM

## 2021-12-31 ENCOUNTER — Other Ambulatory Visit: Payer: Self-pay | Admitting: Internal Medicine

## 2021-12-31 DIAGNOSIS — F419 Anxiety disorder, unspecified: Secondary | ICD-10-CM

## 2022-01-20 ENCOUNTER — Ambulatory Visit: Payer: Medicare Other | Admitting: Pulmonary Disease

## 2022-01-20 ENCOUNTER — Encounter: Payer: Self-pay | Admitting: Pulmonary Disease

## 2022-01-20 VITALS — BP 140/80 | HR 96 | Ht 62.0 in | Wt 148.0 lb

## 2022-01-20 DIAGNOSIS — J449 Chronic obstructive pulmonary disease, unspecified: Secondary | ICD-10-CM | POA: Diagnosis not present

## 2022-01-20 MED ORDER — PREDNISONE 5 MG PO TABS: 5.0000 mg | ORAL_TABLET | Freq: Every day | ORAL | 0 refills | Status: DC

## 2022-01-20 MED ORDER — INCRUSE ELLIPTA 62.5 MCG/ACT IN AEPB: 1.0000 | INHALATION_SPRAY | Freq: Every day | RESPIRATORY_TRACT | 5 refills | Status: DC

## 2022-01-20 MED ORDER — INCRUSE ELLIPTA 62.5 MCG/ACT IN AEPB: 1.0000 | INHALATION_SPRAY | Freq: Every day | RESPIRATORY_TRACT | 2 refills | Status: DC

## 2022-01-20 NOTE — Progress Notes (Signed)
Subjective:   PATIENT ID: Natalie Smith GENDER: female DOB: [birth year], MRN: [id number]   HPI  Chief Complaint  Patient presents with   Consult    Self referral COPD    Reason for Visit: Self referral COPD  Ms. Natalie Smith is a 76 year old female former smoker with COPD, allergic rhinitis, GERD, HLD who presents as a new consult.  She was was previously seen by me >3 years ago on 11/16/18. At the time she had COPD hospitalization in 09/2018. Did not tolerate Breo (sensation of medication sticking in throat) but did well with Symbicort. She does not tolerated Spiriva due to cough.  She is currently taking flucticasone propionate 250/50 once twice daily. She is taking the bronkaid daily and feels dependent on it. Her main symptoms are shortness of breath with moderate exertion. Denies cough. Occasional wheezing. Denies nocturnal symptoms. Does not need oxygen 89 or 90%.  Social History: 1ppd x 50 years. Quit smoking 09/2021.  Past Medical History:  Diagnosis Date   Allergic rhinitis    ALLERGIC RHINITIS 03/01/2010   Anxiety    ANXIETY 10/21/2008   CERVICAL RADICULOPATHY, RIGHT 02/19/2010   COPD (chronic obstructive pulmonary disease) (HCC)    EPIGASTRIC PAIN 02/19/2010   Gallstones 07/22/2011   GERD (gastroesophageal reflux disease) 08/09/2013   Headache(784.0) 04/21/2007   Hyperlipidemia    HYPERLIPIDEMIA 10/21/2008   LEG PAIN, LEFT 08/20/2009   RASH-NONVESICULAR 06/26/2007   THYROID DISORDER 04/21/2007   Thyroid nodule 1978   benign   Vitamin D deficiency 11/22/2011     Family History  Problem Relation Age of Onset   Heart failure Mother    Emphysema Mother    Parkinsonism Father    Hypertension Other    Diabetes Brother    Asthma Grandchild    Cancer Neg Hx      Social History   Occupational History   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  Vaping Use   Vaping Use: Never used  Substance and Sexual Activity   Alcohol use: Yes    Alcohol/week: 0.0 standard drinks    Comment: occasionally; vodka on occ   Drug use: No   Sexual activity: Not on file    Allergies  Allergen Reactions   Codeine Nausea And Vomiting   Tramadol Itching     Outpatient Medications Prior to Visit  Medication Sig Dispense Refill   buPROPion (WELLBUTRIN XL) 150 MG 24 hr tablet TAKE 1 TABLET(150 MG) BY MOUTH DAILY 90 tablet 1   fluticasone-salmeterol (ADVAIR) 250-50 MCG/ACT AEPB INHALE 1 PUFF INTO THE LUNGS IN THE MORNING AND AT BEDTIME 180 each 0   LORazepam (ATIVAN) 0.5 MG tablet TAKE 1 TABLET BY MOUTH TWICE DAILY AS NEEDED FOR ANXIETY 45 tablet 2   Vitamin D, Ergocalciferol, (DRISDOL) 1.25 MG (50000 UNIT) CAPS capsule Take 1 capsule (50,000 Units total) by mouth every 7 (seven) days for 12 doses. 12 capsule 0   albuterol (VENTOLIN HFA) 108 (90 Base) MCG/ACT inhaler INHALE 2 PUFFS INTO THE LUNGS EVERY 6 HOURS AS NEEDED FOR WHEEZING OR SHORTNESS OF BREATH (Patient not taking: Reported on 01/20/2022) 18 g 2  Tiotropium Bromide Monohydrate (SPIRIVA RESPIMAT) 2.5 MCG/ACT AERS Inhale 2 puffs into the lungs daily. (Patient not taking: Reported on 01/20/2022) 1 each 3   No facility-administered medications prior to visit.    Review of Systems  Constitutional:  Negative for chills, diaphoresis, fever, malaise/fatigue and weight loss.  HENT:  Negative for congestion.   Respiratory:  Positive for shortness of breath. Negative for cough, hemoptysis, sputum production and wheezing.   Cardiovascular:  Negative for chest pain, palpitations and leg swelling.    Objective:   Vitals:   01/20/22 1114  BP: 140/80  Pulse: 96  SpO2: 94%  Weight: 148 lb (67.1 kg)  Height: 5\' 2"  (1.575 m)   SpO2: 94 % O2 Device: None  (Room air)  Physical Exam: General: Well-appearing, no acute distress HENT: [place], AT Eyes: EOMI, no scleral icterus Respiratory: Clear to auscultation bilaterally.  No crackles, wheezing or rales Cardiovascular: RRR, -M/R/G, no JVD Extremities:-Edema,-tenderness Neuro: AAO x4, CNII-XII grossly intact Psych: Normal mood, normal affect  Data Reviewed:  Imaging: CXR 10/04/18 - Right basilar infiltrate. Hyperinflation. CXR 06/12/21 - Hyperinflation  PFT: 04/21/17 FVC 1.66 (61%) FEV1 1.00 (48%) Ratio 63  TLC 101% DLCO 82% Interpretation: Severe obstructive defect with air trapping. Significant bronchodilator response.  Labs: CBC w/diff on 10/05/18 reviewed. Abs eos 100  Assessment & Plan:   Discussion: 76 year old female former smoker with COPD who presents for COPD management. Discussed clinical course and management of COPD including bronchodilator regimen and action plan for exacerbation. Counseled on weaning bronch-aid given adverse effects in cardiovascular risks. After discussion will wean with low-dose prednisone with future plan to titrate this off as well.  Severe COPD Bronch-aid dependence --CONTINUE fluticasone-salmeterol 250-50 mcg ONE puff TWICE a day --START Incruse ONE puff ONCE a day --START prednisone 5 mg. When symptoms are stable, will discuss taper --STOP Bronk-aid --Recommend Albuterol as rescue inhaler TWO puffs AS NEEDED   Health Maintenance Pneumonia Pneumovax 02/11/11, Prevnar 02/07/14 Influenza Due CT Lung Screen Due  No orders of the defined types were placed in this encounter.  Meds ordered this encounter  Medications   DISCONTD: umeclidinium bromide (INCRUSE ELLIPTA) 62.5 MCG/ACT AEPB    Sig: Inhale 1 puff into the lungs daily.    Dispense:  30 each    Refill:  5   predniSONE (DELTASONE) 5 MG tablet    Sig: Take 1 tablet (5 mg total) by mouth daily with breakfast.    Dispense:  60 tablet    Refill:  0   umeclidinium bromide (INCRUSE ELLIPTA) 62.5  MCG/ACT AEPB    Sig: Inhale 1 puff into the lungs daily.    Dispense:  30 each    Refill:  2    Return in about 2 months (around 03/22/2022).   I have spent a total time of 45-minutes on the day of the appointment including chart review, data review, collecting history, coordinating care and discussing medical diagnosis and plan with the patient/family. Past medical history, allergies, medications were reviewed. Pertinent imaging, labs and tests included in this note have been reviewed and interpreted independently by me.  Delena Casebeer 03/24/2022, MD [facility] 01/20/2022 11:27 AM  Office Number [phone]

## 2022-01-20 NOTE — Patient Instructions (Signed)
Severe COPD ?Blockade dependence ?-CONTINUE fluticasone-salmeterol 250-50 mcg ONE puff TWICE a day ?-START Incruse ONE puff ONCE a day ?-START prednisone 5 mg. When symptoms are stable, will discuss taper ?-STOP Bronk-aid ?-Recommend Albuterol as rescue inhaler TWO puffs AS NEEDED ? ?Follow-up with me in 2 month ?

## 2022-01-21 ENCOUNTER — Encounter: Payer: Self-pay | Admitting: Pulmonary Disease

## 2022-02-12 ENCOUNTER — Encounter: Payer: Self-pay | Admitting: Orthopedic Surgery

## 2022-02-12 ENCOUNTER — Ambulatory Visit (INDEPENDENT_AMBULATORY_CARE_PROVIDER_SITE_OTHER): Payer: Medicare Other

## 2022-02-12 ENCOUNTER — Ambulatory Visit: Payer: Medicare Other | Admitting: Orthopedic Surgery

## 2022-02-12 ENCOUNTER — Ambulatory Visit: Payer: Self-pay

## 2022-02-12 VITALS — Ht 63.0 in | Wt 150.0 lb

## 2022-02-12 DIAGNOSIS — M1712 Unilateral primary osteoarthritis, left knee: Secondary | ICD-10-CM

## 2022-02-12 DIAGNOSIS — M1711 Unilateral primary osteoarthritis, right knee: Secondary | ICD-10-CM

## 2022-02-12 DIAGNOSIS — M545 Low back pain, unspecified: Secondary | ICD-10-CM

## 2022-02-12 DIAGNOSIS — M170 Bilateral primary osteoarthritis of knee: Secondary | ICD-10-CM

## 2022-02-26 ENCOUNTER — Ambulatory Visit
Admission: RE | Admit: 2022-02-26 | Discharge: 2022-02-26 | Disposition: A | Discharge status: Home / Self Care | Payer: Medicare Other | Source: Ambulatory Visit | Attending: Orthopedic Surgery | Admitting: Orthopedic Surgery

## 2022-02-26 DIAGNOSIS — M545 Low back pain, unspecified: Secondary | ICD-10-CM | POA: Diagnosis not present

## 2022-02-26 DIAGNOSIS — M5136 Other intervertebral disc degeneration, lumbar region: Secondary | ICD-10-CM | POA: Diagnosis not present
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
## 2022-03-05 ENCOUNTER — Ambulatory Visit (INDEPENDENT_AMBULATORY_CARE_PROVIDER_SITE_OTHER): Payer: Medicare Other | Admitting: Orthopedic Surgery

## 2022-03-05 DIAGNOSIS — M545 Low back pain, unspecified: Secondary | ICD-10-CM

## 2022-03-05 DIAGNOSIS — M170 Bilateral primary osteoarthritis of knee: Secondary | ICD-10-CM | POA: Diagnosis not present

## 2022-03-10 ENCOUNTER — Encounter: Payer: Self-pay | Admitting: Orthopedic Surgery
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
## 2022-03-16 ENCOUNTER — Other Ambulatory Visit: Payer: Self-pay | Admitting: Pulmonary Disease

## 2022-03-22 ENCOUNTER — Ambulatory Visit: Payer: Medicare Other | Admitting: Pulmonary Disease

## 2022-03-22 ENCOUNTER — Encounter: Payer: Self-pay | Admitting: Pulmonary Disease

## 2022-03-22 VITALS — BP 144/80 | HR 83 | Temp 98.2°F | Ht 63.0 in | Wt 157.2 lb

## 2022-03-22 DIAGNOSIS — J449 Chronic obstructive pulmonary disease, unspecified: Secondary | ICD-10-CM | POA: Diagnosis not present

## 2022-03-22 MED ORDER — PREDNISONE 5 MG PO TABS: 10.0000 mg | ORAL_TABLET | Freq: Every day | ORAL | 0 refills | Status: AC

## 2022-03-22 NOTE — Patient Instructions (Addendum)
Severe COPD --CONTINUE fluticasone-salmeterol 250-50 mcg ONE puff TWICE a day --CONTINUE Incruse ONE puff ONCE a day. Consider DC if symptoms persist --INCREASE prednisone 10 mg --STOP Bronk-aid --Recommend Albuterol as rescue inhaler TWO puffs AS NEEDED  Follow-up with me in 2 months

## 2022-03-22 NOTE — Progress Notes (Signed)
Subjective:   PATIENT ID: Natalie Smith GENDER: female DOB: August 25, 1946, MRN: [id number]   HPI  Chief Complaint  Patient presents with   Follow-up    No changes in SOB/ cough/ wheezing     Reason for Visit: Follow-up  Ms. Natalie Smith is a 76 year old female former smoker with COPD, allergic rhinitis, GERD, HLD who presents for follow-up.  01/20/22 She was was previously seen by me >3 years ago on 11/16/18. At the time she had COPD hospitalization in 09/2018. Did not tolerate Breo (sensation of medication sticking in throat) but did well with Symbicort. She does not tolerated Spiriva due to cough.  She is currently taking flucticasone propionate 250/50 once twice daily. She is taking the bronkaid daily and feels dependent on it. Her main symptoms are shortness of breath with moderate exertion. Denies cough. Occasional wheezing. Denies nocturnal symptoms. Does not need oxygen 89 or 90%.  03/22/22 She is compliant with Advair and Incruse however reports dyspnea is unchanged. She has taken prednisone 5 mg daily and stopped Bronk-Aid. Denies cough. Occasional wheezing. Has not been able to pick up Albuterol due to the cost of her other medications.  Social History: 1ppd x 50 years. Quit smoking 09/2021. Works at [employer]  Past Medical History:  Diagnosis Date   Allergic rhinitis    ALLERGIC RHINITIS 03/01/2010   Anxiety    ANXIETY 10/21/2008   CERVICAL RADICULOPATHY, RIGHT 02/19/2010   COPD (chronic obstructive pulmonary disease) (HCC)    EPIGASTRIC PAIN 02/19/2010   Gallstones 07/22/2011   GERD (gastroesophageal reflux disease) 08/09/2013   Headache(784.0) 04/21/2007   Hyperlipidemia    HYPERLIPIDEMIA 10/21/2008   LEG PAIN, LEFT 08/20/2009   RASH-NONVESICULAR 06/26/2007   THYROID DISORDER 04/21/2007   Thyroid nodule 1978   benign   Vitamin D deficiency 11/22/2011     Family History  Problem Relation Age of Onset   Heart failure Mother    Emphysema Mother     Parkinsonism Father    Hypertension Other    Diabetes Brother    Asthma Grandchild    Cancer Neg Hx      Social History   Occupational History   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  Vaping Use   Vaping Use: Never used  Substance and Sexual Activity   Alcohol use: Yes    Alcohol/week: 0.0 standard drinks of alcohol    Comment: occasionally; vodka on occ   Drug use: No   Sexual activity: Not on file    Allergies  Allergen Reactions   Codeine Nausea And Vomiting   Tramadol Itching     Outpatient Medications Prior to Visit  Medication Sig Dispense Refill   buPROPion (WELLBUTRIN XL) 150 MG 24 hr tablet TAKE 1 TABLET(150 MG) BY MOUTH DAILY 90 tablet 1   fluticasone-salmeterol (ADVAIR) 250-50 MCG/ACT AEPB INHALE 1 PUFF INTO THE LUNGS IN THE MORNING AND AT BEDTIME 180 each 0   LORazepam (ATIVAN) 0.5 MG tablet TAKE 1 TABLET BY MOUTH TWICE DAILY AS NEEDED FOR ANXIETY 45 tablet 2   predniSONE (DELTASONE) 5 MG tablet TAKE 1 TABLET(5 MG) BY MOUTH  DAILY WITH BREAKFAST 60 tablet 0   umeclidinium bromide (INCRUSE ELLIPTA) 62.5 MCG/ACT AEPB Inhale 1 puff into the lungs daily. 30 each 2   albuterol (VENTOLIN HFA) 108 (90 Base) MCG/ACT inhaler INHALE 2 PUFFS INTO THE LUNGS EVERY 6 HOURS AS NEEDED FOR WHEEZING OR SHORTNESS OF BREATH (Patient not taking: Reported on 01/20/2022) 18 g 2   No facility-administered medications prior to visit.    Review of Systems  Constitutional:  Negative for chills, diaphoresis, fever, malaise/fatigue and weight loss.  HENT:  Negative for congestion.   Respiratory:  Positive for cough and shortness of breath. Negative for hemoptysis, sputum production and wheezing.   Cardiovascular:   Negative for chest pain, palpitations and leg swelling.     Objective:   Vitals:   03/22/22 1136  BP: (!) 144/80  Pulse: 83  Temp: 98.2 F (36.8 C)  TempSrc: Oral  SpO2: 94%  Weight: 157 lb 3.2 oz (71.3 kg)  Height: 5\' 3"  (1.6 m)     Physical Exam: General: Well-appearing, no acute distress HENT: [place], AT Eyes: EOMI, no scleral icterus Respiratory: Clear to auscultation bilaterally.  No crackles, wheezing or rales Cardiovascular: RRR, -M/R/G, no JVD Extremities:-Edema,-tenderness Neuro: AAO x4, CNII-XII grossly intact Psych: Normal mood, normal affect  Data Reviewed:  Imaging: CXR 10/04/18 - Right basilar infiltrate. Hyperinflation. CXR 06/12/21 - Hyperinflation  PFT: 04/21/17 FVC 1.66 (61%) FEV1 1.00 (48%) Ratio 63  TLC 101% DLCO 82% Interpretation: Severe obstructive defect with air trapping. Significant bronchodilator response.  Labs: CBC w/diff on 10/05/18 reviewed. Abs eos 100  Assessment & Plan:   Discussion: 76 year old female former smoker with COPD who presents for follow-up for COPD. On triple therapy and chronic prednisone therapy however continues to feel short of breath. She still feels BronkAid was more effective. Counseled on restarting due to cardiovascular risk. Will increase prednisone dose with plan for titration in the future.  Severe COPD Bronch-aid dependence --CONTINUE fluticasone-salmeterol 250-50 mcg ONE puff TWICE a day --CONTINUE Incruse ONE puff ONCE a day. Consider DC if symptoms persist --INCREASE prednisone 10 mg --STOP Bronk-aid --Recommend Albuterol as rescue inhaler TWO puffs AS NEEDED   Health Maintenance Immunization History  Administered Date(s) Administered   Influenza, High Dose Seasonal PF 08/09/2013, 06/05/2021   Moderna Sars-Covid-2 Vaccination 06/05/2021   PFIZER(Purple Top)SARS-COV-2 Vaccination 09/28/2019, 11/01/2019, 08/06/2020   Pneumococcal Conjugate-13 02/07/2014   Pneumococcal Polysaccharide-23 02/11/2011,  04/25/2020   Td 03/03/2016   Zoster, Live 10/21/2008    CT Lung Screen ordered  No orders of the defined types were placed in this encounter.  Meds ordered this encounter  Medications   predniSONE (DELTASONE) 5 MG tablet    Sig: Take 2 tablets (10 mg total) by mouth daily at 2 PM.    Dispense:  120 tablet    Refill:  0    Return in about 2 months (around 05/23/2022).   I have spent a total time of 30-minutes on the day of the appointment including chart review, data review, collecting history, coordinating care and discussing medical diagnosis and plan with the patient/family. Past medical history, allergies, medications were reviewed. Pertinent imaging, labs and tests included in this note have been reviewed and interpreted independently by me.  Marylou Wages 05/25/2022, MD [facility] 03/22/2022 11:37 AM  Office Number [phone]

## 2022-03-26 ENCOUNTER — Other Ambulatory Visit: Payer: Self-pay | Admitting: Internal Medicine

## 2022-03-26 DIAGNOSIS — F419 Anxiety disorder, unspecified: Secondary | ICD-10-CM

## 2022-03-31 ENCOUNTER — Other Ambulatory Visit: Payer: Self-pay | Admitting: Internal Medicine

## 2022-03-31 DIAGNOSIS — J449 Chronic obstructive pulmonary disease, unspecified: Secondary | ICD-10-CM

## 2022-04-13 DIAGNOSIS — L03124 Acute lymphangitis of left upper limb: Secondary | ICD-10-CM | POA: Diagnosis not present

## 2022-04-13 DIAGNOSIS — L03114 Cellulitis of left upper limb: Secondary | ICD-10-CM | POA: Diagnosis not present

## 2022-04-15 ENCOUNTER — Encounter (HOSPITAL_BASED_OUTPATIENT_CLINIC_OR_DEPARTMENT_OTHER): Payer: Self-pay

## 2022-04-15 ENCOUNTER — Emergency Department (HOSPITAL_BASED_OUTPATIENT_CLINIC_OR_DEPARTMENT_OTHER)
Admission: EM | Admit: 2022-04-15 | Discharge: 2022-04-15 | Disposition: A | Discharge status: Home / Self Care | Payer: Medicare Other | Attending: Emergency Medicine | Admitting: Emergency Medicine

## 2022-04-15 ENCOUNTER — Other Ambulatory Visit: Payer: Self-pay

## 2022-04-15 DIAGNOSIS — L03114 Cellulitis of left upper limb: Secondary | ICD-10-CM | POA: Diagnosis not present

## 2022-04-15 DIAGNOSIS — L0390 Cellulitis, unspecified: Secondary | ICD-10-CM

## 2022-04-15 MED ORDER — TETANUS-DIPHTH-ACELL PERTUSSIS 5-2.5-18.5 LF-MCG/0.5 IM SUSY: 0.5000 mL | PREFILLED_SYRINGE | Freq: Once | INTRAMUSCULAR | Status: DC

## 2022-04-15 MED ORDER — DOXYCYCLINE HYCLATE 100 MG PO CAPS: 100.0000 mg | ORAL_CAPSULE | Freq: Two times a day (BID) | ORAL | 0 refills | Status: AC

## 2022-04-15 MED ORDER — DOXYCYCLINE HYCLATE 100 MG PO TABS
100.0000 mg | ORAL_TABLET | Freq: Once | ORAL | Status: AC
  Administered 2022-04-15: 100 mg via ORAL
  Filled 2022-04-15: qty 1

## 2022-04-15 NOTE — ED Provider Notes (Addendum)
[facility] EMERGENCY DEPT Provider Note   CSN: [id number] Arrival date & time: 04/15/22  1302     History  Chief Complaint  Patient presents with   Cellulitis    Natalie Smith is a 76 y.o. female.  Is here for redness and infection in the left forearm area.  She scraped her skin on an edge of a piece of wood several weeks ago.  She has been on Bactrim the last 2 days for cellulitis around this area.  Tetanus shot is up-to-date.  Denies any fevers or chills.  No history of diabetes.  Redness seems to not have gotten much better or much worse since starting Bactrim.  The history is provided by the patient.       Home Medications Prior to Admission medications   Medication Sig Start Date End Date Taking? Authorizing Provider  doxycycline (VIBRAMYCIN) 100 MG capsule Take 1 capsule (100 mg total) by mouth 2 (two) times daily for 10 days. 04/15/22 04/25/22 Yes Clarkson Rosselli, DO  albuterol (VENTOLIN HFA) 108 (90 Base) MCG/ACT inhaler INHALE 2 PUFFS INTO THE LUNGS EVERY 6 HOURS AS NEEDED FOR WHEEZING OR SHORTNESS OF BREATH Patient not taking: Reported on 01/20/2022 12/10/20   Philip Aspen, Limmie Patricia, MD  buPROPion (WELLBUTRIN XL) 150 MG 24 hr tablet TAKE 1 TABLET(150 MG) BY MOUTH DAILY 11/09/21   Philip Aspen, Limmie Patricia, MD  fluticasone-salmeterol (ADVAIR) 250-50 MCG/ACT AEPB INHALE 1 PUFF INTO THE LUNGS IN THE MORNING AND AT BEDTIME 04/01/22   Philip Aspen, Limmie Patricia, MD  LORazepam (ATIVAN) 0.5 MG tablet TAKE 1 TABLET BY MOUTH TWICE DAILY AS NEEDED FOR ANXIETY 03/29/22   Philip Aspen, Limmie Patricia, MD  predniSONE (DELTASONE) 5 MG tablet Take 2 tablets (10 mg total) by mouth daily at 2 PM. 03/22/22 05/21/22  Luciano Cutter, MD  umeclidinium bromide (INCRUSE ELLIPTA) 62.5 MCG/ACT AEPB Inhale 1 puff into the lungs daily. 01/20/22   Luciano Cutter, MD      Allergies    Codeine and Tramadol    Review of Systems   Review of Systems  Physical Exam Updated Vital  Signs BP (!) 155/86 (BP Location: Right Arm)   Pulse (!) 104   Temp 98 F (36.7 C)   Resp 18   Ht 5\' 3"  (1.6 m)   Wt 71.3 kg   SpO2 95%   BMI 27.84 kg/m  Physical Exam Vitals and nursing note reviewed.  Constitutional:      General: She is not in acute distress.    Appearance: She is well-developed.  HENT:     Head: Normocephalic and atraumatic.  Eyes:     Conjunctiva/sclera: Conjunctivae normal.  Cardiovascular:     Rate and Rhythm: Normal rate and regular rhythm.     Pulses: Normal pulses.     Heart sounds: No murmur heard. Pulmonary:     Effort: Pulmonary effort is normal. No respiratory distress.     Breath sounds: Normal breath sounds.  Abdominal:     Palpations: Abdomen is soft.     Tenderness: There is no abdominal tenderness.  Musculoskeletal:        General: No swelling or tenderness.     Cervical back: Neck supple.  Skin:    General: Skin is warm and dry.     Capillary Refill: Capillary refill takes less than 2 seconds.     Findings: Erythema present.     Comments: There is an area of cellulitis in the back of the left  forearm around an abrasion site.  This is localized, does not go over any joint spaces, no crepitus or fluctuance  Neurological:     Mental Status: She is alert.  Psychiatric:        Mood and Affect: Mood normal.     ED Results / Procedures / Treatments   Labs (all labs ordered are listed, but only abnormal results are displayed) Labs Reviewed - No data to display  EKG None  Radiology No results found.  Procedures Procedures    Medications Ordered in ED Medications  doxycycline (VIBRA-TABS) tablet 100 mg (has no administration in time range)    ED Course/ Medical Decision Making/ A&P                           Medical Decision Making Risk Prescription drug management.   Natalie Smith is here for cellulitis on her left forearm.  Normal vitals.  No fever.  No significant comorbidities.  Has been on Bactrim the last 2 days  for suspected cellulitis in the left forearm.  Overall this appears to still be fairly localized process.  She has an area of skin breakdown with some surrounding redness but no crepitus or purulent drainage or abscess formation.  I am not concerned that this involves the joint space.  At this time I think it is reasonable to start her on doxycycline instead of Bactrim to get better MRSA coverage.  Will continue to give her wound care instructions with bacitracin/Neosporin twice daily locally over the wound.  Recommend wound recheck in 48 to 72 hours or sooner if redness progresses.  Neurovascular neuromuscular intact.  Discharged in good condition.  Tetanus shot declined by patient.  This chart was dictated using voice recognition software.  Despite best efforts to proofread,  errors can occur which can change the documentation meaning.         Final Clinical Impression(s) / ED Diagnoses Final diagnoses:  Cellulitis, unspecified cellulitis site    Rx / DC Orders ED Discharge Orders          Ordered    doxycycline (VIBRAMYCIN) 100 MG capsule  2 times daily        04/15/22 1522              Virgina Norfolk, DO 04/15/22 1526    Honest Safranek, DO 04/15/22 1535

## 2022-04-15 NOTE — ED Triage Notes (Signed)
Patient here POV from Home.  Endorses a Distal Left Forearm Skin Infection that began approximately 5-6 Days ago. Associated Wound that she sustained 3 Weeks ago.    Was seen at [facility] on Monday and given bactrim PO and Rocephin IM. States it has only worsened since.   No Fevers. Some Drainage noted.   NAD Noted during Triage. A&Ox4. GCS 15. Ambulatory

## 2022-04-15 NOTE — Discharge Instructions (Signed)
Take next dose antibiotic tomorrow morning.  Please consider wound recheck in 48 to 72 hours but please come back sooner if redness progresses rapidly as we talked about.

## 2022-04-19 ENCOUNTER — Inpatient Hospital Stay: Payer: Medicare Other | Admitting: Family Medicine

## 2022-04-26 ENCOUNTER — Ambulatory Visit: Payer: Medicare Other | Admitting: Internal Medicine

## 2022-04-26 VITALS — BP 130/80 | HR 79 | Temp 97.5°F | Wt 160.8 lb

## 2022-04-26 DIAGNOSIS — R030 Elevated blood-pressure reading, without diagnosis of hypertension: Secondary | ICD-10-CM | POA: Diagnosis not present

## 2022-04-26 DIAGNOSIS — J449 Chronic obstructive pulmonary disease, unspecified: Secondary | ICD-10-CM

## 2022-04-26 DIAGNOSIS — E785 Hyperlipidemia, unspecified: Secondary | ICD-10-CM | POA: Diagnosis not present

## 2022-04-26 DIAGNOSIS — L03114 Cellulitis of left upper limb: Secondary | ICD-10-CM | POA: Diagnosis not present

## 2022-04-26 DIAGNOSIS — F419 Anxiety disorder, unspecified: Secondary | ICD-10-CM

## 2022-04-26 DIAGNOSIS — E559 Vitamin D deficiency, unspecified: Secondary | ICD-10-CM

## 2022-04-26 DIAGNOSIS — F339 Major depressive disorder, recurrent, unspecified: Secondary | ICD-10-CM

## 2022-04-26 MED ORDER — LORAZEPAM 0.5 MG PO TABS: 0.5000 mg | ORAL_TABLET | Freq: Two times a day (BID) | ORAL | 1 refills | Status: DC | PRN

## 2022-04-26 MED ORDER — DOXYCYCLINE HYCLATE 100 MG PO TABS: 100.0000 mg | ORAL_TABLET | Freq: Two times a day (BID) | ORAL | 0 refills | Status: AC
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
## 2022-05-14 IMAGING — MG MM DIGITAL DIAGNOSTIC UNILAT*R* W/ TOMO W/ CAD
6 series · 6 of 14 positions shown · non-contrast
Comparison: Previous exam(s).

CLINICAL DATA: Short-term follow-up for right breast
calcifications, initially assessed in May 2020.

EXAM:
DIGITAL DIAGNOSTIC UNILATERAL RIGHT MAMMOGRAM WITH TOMOSYNTHESIS AND
CAD
TECHNIQUE: Right digital diagnostic mammography and breast tomosynthesis was
performed. The images were evaluated with computer-aided detection.

[R ML]
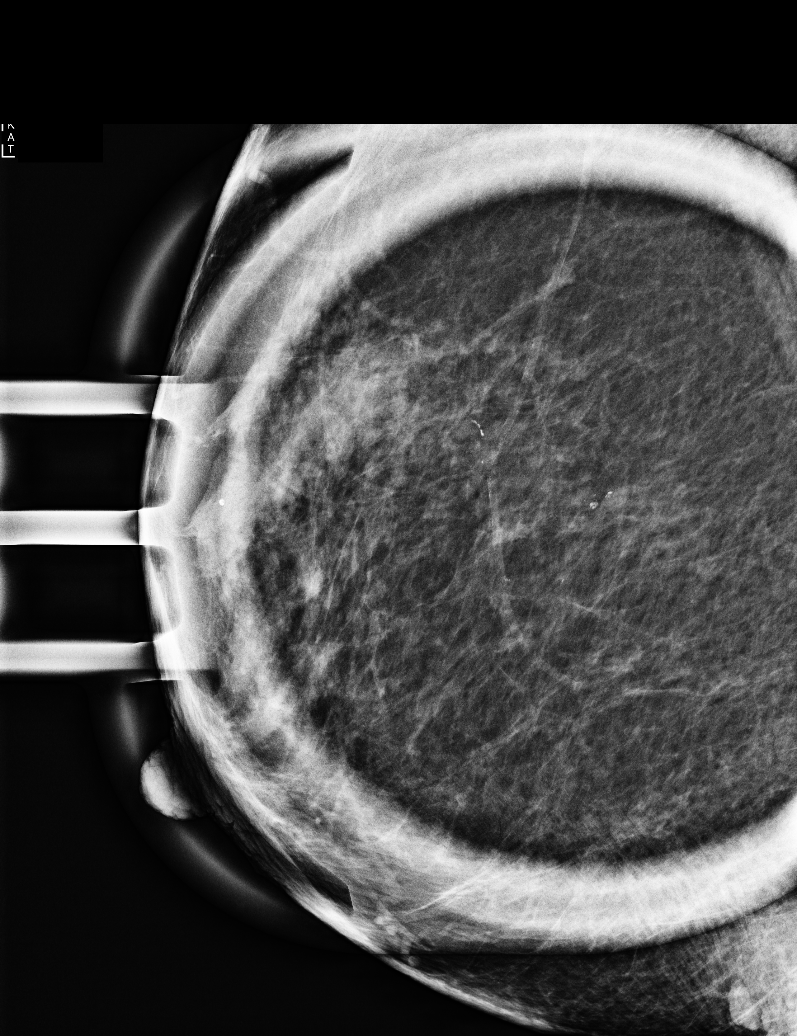

[R CC]
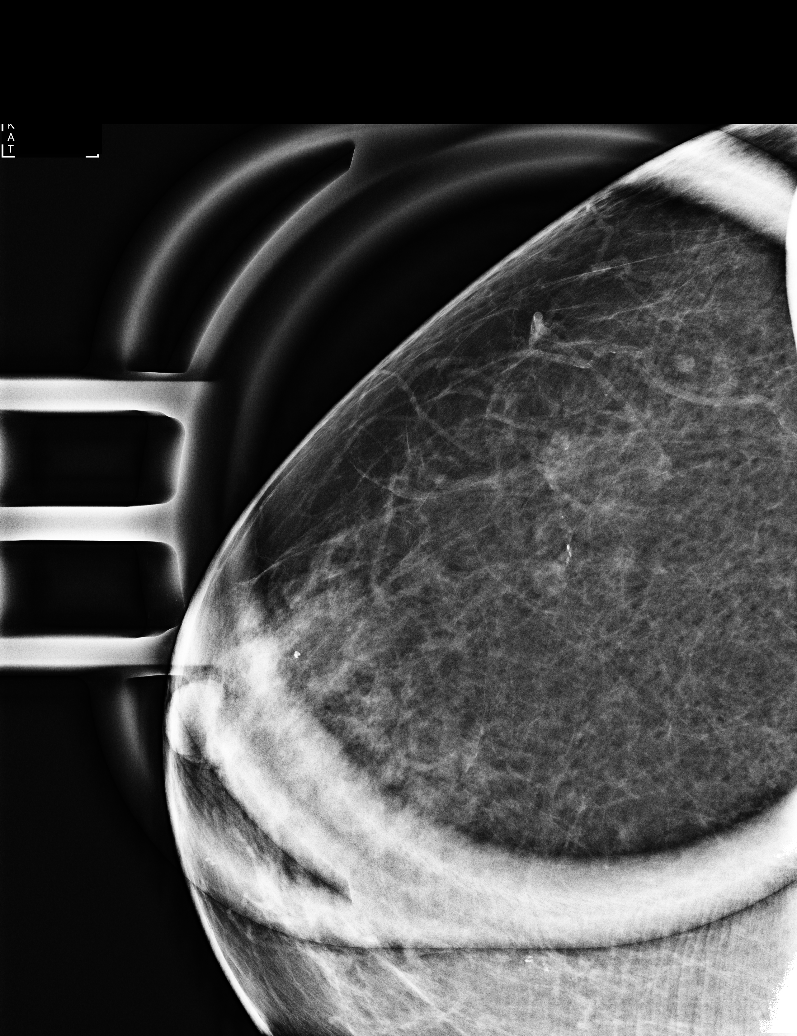

[R MLO synth-2D]
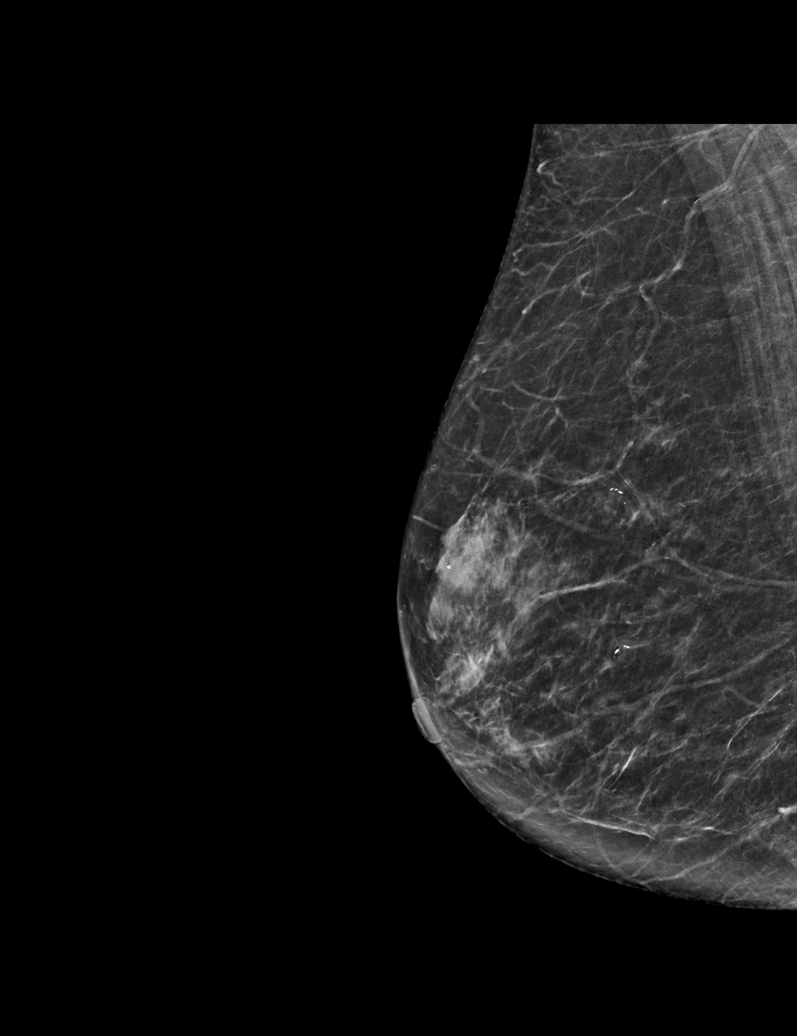

[R CC synth-2D]
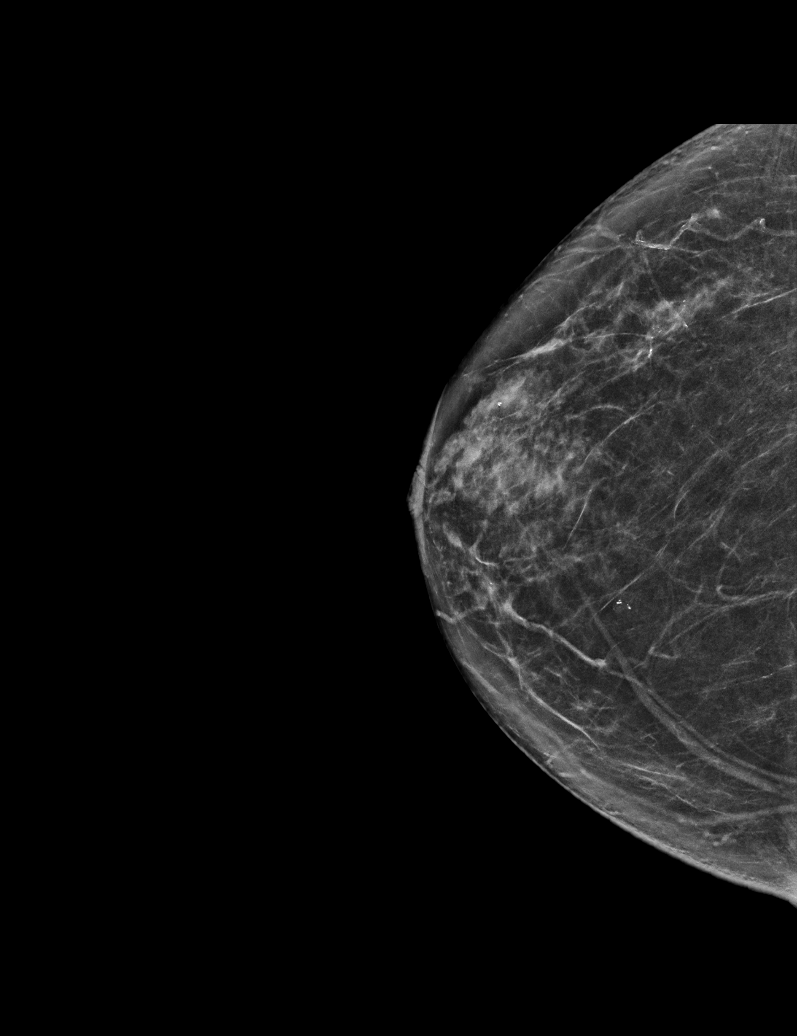

[R MLO tomo · tomo slice 30/59.0]
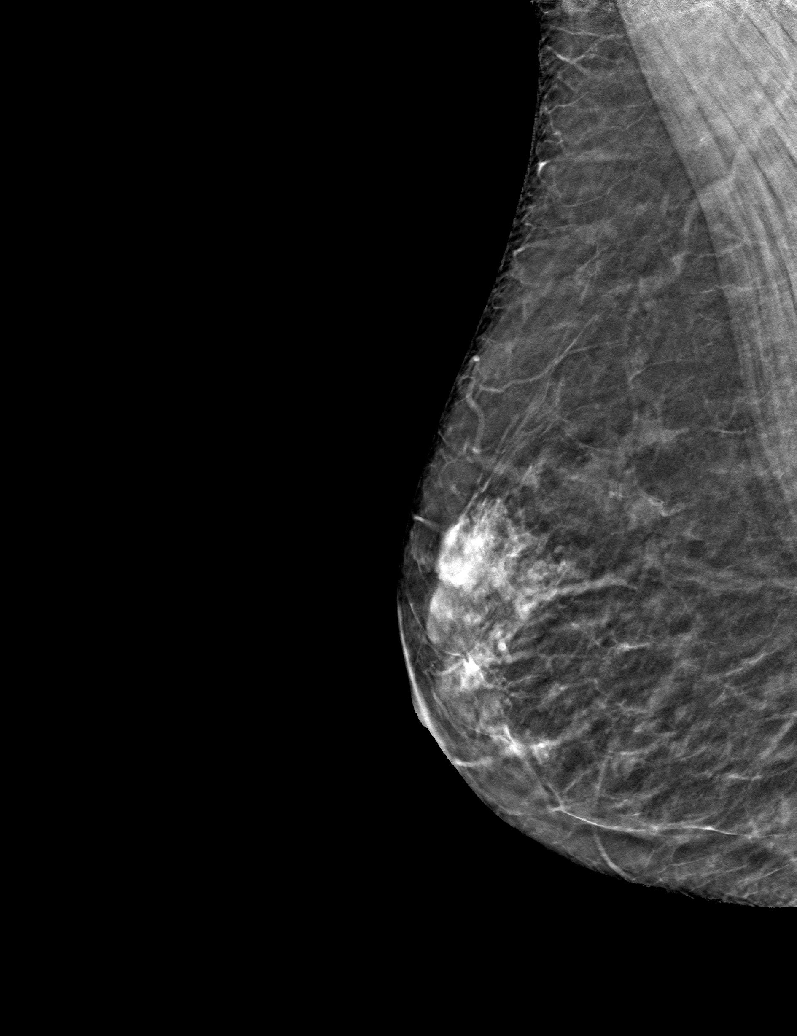

[R CC tomo · tomo slice 31/61.0]
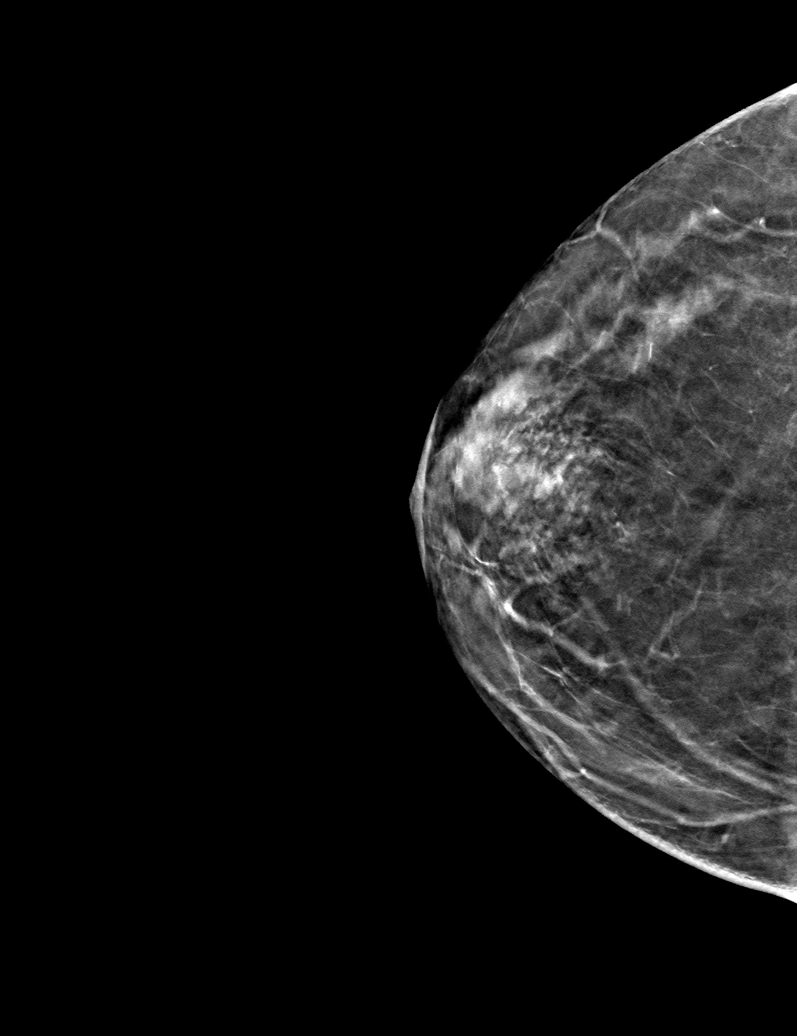

[6 of 14 positions shown; findings below may reference images not displayed]

ACR Breast Density Category b: There are scattered areas of
fibroglandular density.
FINDINGS: The calcifications noted in the upper outer right breast are
unchanged.

There are no masses or areas of architectural distortion. There are
no new calcifications.
IMPRESSION: 1. Probably benign right breast calcifications, stable for 6 months.
Additional short-term follow-up recommended.

RECOMMENDATION:
Diagnostic mammography with right breast magnification views in 6
months.

I have discussed the findings and recommendations with the patient.
If applicable, a reminder letter will be sent to the patient
regarding the next appointment.

BI-RADS CATEGORY  3: Probably benign.

## 2022-05-24 ENCOUNTER — Other Ambulatory Visit: Payer: Self-pay | Admitting: Internal Medicine

## 2022-05-24 DIAGNOSIS — F419 Anxiety disorder, unspecified: Secondary | ICD-10-CM

## 2022-05-31 ENCOUNTER — Ambulatory Visit: Payer: Medicare Other | Admitting: Pulmonary Disease

## 2022-06-21 ENCOUNTER — Other Ambulatory Visit: Payer: Self-pay | Admitting: Internal Medicine

## 2022-06-28 ENCOUNTER — Telehealth: Payer: Self-pay | Admitting: Internal Medicine

## 2022-06-28 DIAGNOSIS — F419 Anxiety disorder, unspecified: Secondary | ICD-10-CM

## 2022-07-01 ENCOUNTER — Telehealth: Payer: Self-pay | Admitting: Internal Medicine

## 2022-07-01 ENCOUNTER — Other Ambulatory Visit: Payer: Self-pay | Admitting: Internal Medicine

## 2022-07-01 DIAGNOSIS — J449 Chronic obstructive pulmonary disease, unspecified: Secondary | ICD-10-CM

## 2022-07-05 NOTE — Addendum Note (Signed)
Addended by: Westley Hummer B on: 07/05/2022 10:22 AM   Modules accepted: Orders

## 2022-07-05 NOTE — Telephone Encounter (Signed)
Pt called to FU on this refill, stating she is completely out. 

## 2022-07-05 NOTE — Telephone Encounter (Signed)
Pt last had refill on medication 12-25-2021

## 2022-07-05 NOTE — Telephone Encounter (Signed)
Pt called to FU on this refill, stating she is completely out.

## 2022-07-06 ENCOUNTER — Telehealth: Payer: Self-pay | Admitting: Internal Medicine

## 2022-07-06 MED ORDER — FLUTICASONE-SALMETEROL 250-50 MCG/ACT IN AEPB: 1.0000 | INHALATION_SPRAY | Freq: Two times a day (BID) | RESPIRATORY_TRACT | 3 refills | Status: DC

## 2022-07-06 NOTE — Telephone Encounter (Signed)
Pt checking on progress of this refill  fluticasone-salmeterol (ADVAIR) 250-50 MCG/ACT AEPB

## 2022-07-06 NOTE — Telephone Encounter (Signed)
Refill sent.

## 2022-07-06 NOTE — Addendum Note (Signed)
Addended by: Westley Hummer B on: 07/06/2022 04:23 PM   Modules accepted: Orders

## 2022-07-06 NOTE — Telephone Encounter (Signed)
error 

## 2022-08-02 ENCOUNTER — Other Ambulatory Visit: Payer: Self-pay | Admitting: Internal Medicine

## 2022-08-02 DIAGNOSIS — F419 Anxiety disorder, unspecified: Secondary | ICD-10-CM

## 2022-08-26 ENCOUNTER — Emergency Department (HOSPITAL_BASED_OUTPATIENT_CLINIC_OR_DEPARTMENT_OTHER): Payer: Medicare Other

## 2022-08-26 ENCOUNTER — Encounter: Payer: Self-pay | Admitting: Internal Medicine

## 2022-08-26 ENCOUNTER — Encounter (HOSPITAL_COMMUNITY): Payer: Self-pay

## 2022-08-26 ENCOUNTER — Ambulatory Visit (INDEPENDENT_AMBULATORY_CARE_PROVIDER_SITE_OTHER): Payer: Medicare Other | Admitting: Internal Medicine

## 2022-08-26 ENCOUNTER — Observation Stay (HOSPITAL_BASED_OUTPATIENT_CLINIC_OR_DEPARTMENT_OTHER)
Admission: EM | Admit: 2022-08-26 | Discharge: 2022-08-28 | Disposition: A | Discharge status: Home / Self Care | Payer: Medicare Other | Attending: Internal Medicine | Admitting: Internal Medicine

## 2022-08-26 ENCOUNTER — Encounter (HOSPITAL_BASED_OUTPATIENT_CLINIC_OR_DEPARTMENT_OTHER): Payer: Self-pay | Admitting: Emergency Medicine

## 2022-08-26 ENCOUNTER — Other Ambulatory Visit: Payer: Self-pay

## 2022-08-26 VITALS — BP 210/120 | HR 84 | Temp 97.9°F | Wt 161.2 lb

## 2022-08-26 DIAGNOSIS — I161 Hypertensive emergency: Secondary | ICD-10-CM | POA: Diagnosis not present

## 2022-08-26 DIAGNOSIS — R42 Dizziness and giddiness: Secondary | ICD-10-CM

## 2022-08-26 DIAGNOSIS — Z87891 Personal history of nicotine dependence: Secondary | ICD-10-CM | POA: Insufficient documentation

## 2022-08-26 DIAGNOSIS — R269 Unspecified abnormalities of gait and mobility: Secondary | ICD-10-CM | POA: Diagnosis not present

## 2022-08-26 DIAGNOSIS — R112 Nausea with vomiting, unspecified: Secondary | ICD-10-CM | POA: Diagnosis not present

## 2022-08-26 DIAGNOSIS — E785 Hyperlipidemia, unspecified: Secondary | ICD-10-CM | POA: Diagnosis present

## 2022-08-26 DIAGNOSIS — F339 Major depressive disorder, recurrent, unspecified: Secondary | ICD-10-CM | POA: Diagnosis present

## 2022-08-26 DIAGNOSIS — K219 Gastro-esophageal reflux disease without esophagitis: Secondary | ICD-10-CM | POA: Diagnosis present

## 2022-08-26 DIAGNOSIS — I160 Hypertensive urgency: Secondary | ICD-10-CM

## 2022-08-26 DIAGNOSIS — E876 Hypokalemia: Secondary | ICD-10-CM | POA: Insufficient documentation

## 2022-08-26 DIAGNOSIS — R519 Headache, unspecified: Secondary | ICD-10-CM | POA: Diagnosis not present

## 2022-08-26 DIAGNOSIS — Z79899 Other long term (current) drug therapy: Secondary | ICD-10-CM | POA: Insufficient documentation

## 2022-08-26 DIAGNOSIS — J069 Acute upper respiratory infection, unspecified: Secondary | ICD-10-CM | POA: Diagnosis not present

## 2022-08-26 DIAGNOSIS — J440 Chronic obstructive pulmonary disease with acute lower respiratory infection: Secondary | ICD-10-CM | POA: Diagnosis not present

## 2022-08-26 DIAGNOSIS — B20 Human immunodeficiency virus [HIV] disease: Secondary | ICD-10-CM | POA: Insufficient documentation

## 2022-08-26 DIAGNOSIS — I10 Essential (primary) hypertension: Secondary | ICD-10-CM | POA: Insufficient documentation

## 2022-08-26 DIAGNOSIS — F32A Depression, unspecified: Secondary | ICD-10-CM | POA: Diagnosis present

## 2022-08-26 DIAGNOSIS — I169 Hypertensive crisis, unspecified: Secondary | ICD-10-CM

## 2022-08-26 DIAGNOSIS — R55 Syncope and collapse: Secondary | ICD-10-CM | POA: Diagnosis not present

## 2022-08-26 DIAGNOSIS — J449 Chronic obstructive pulmonary disease, unspecified: Secondary | ICD-10-CM | POA: Diagnosis present

## 2022-08-26 LAB — CBC WITH DIFFERENTIAL/PLATELET
Abs Immature Granulocytes: 0.02 10*3/uL (ref 0.00–0.07)
Basophils Absolute: 0 10*3/uL (ref 0.0–0.1)
Basophils Relative: 0 %
Eosinophils Absolute: 0 10*3/uL (ref 0.0–0.5)
Eosinophils Relative: 0 %
HCT: 47.2 % — ABNORMAL HIGH (ref 36.0–46.0)
Hemoglobin: 15.3 g/dL — ABNORMAL HIGH (ref 12.0–15.0)
Immature Granulocytes: 0 %
Lymphocytes Relative: 11 %
Lymphs Abs: 0.6 10*3/uL — ABNORMAL LOW (ref 0.7–4.0)
MCH: 30.6 pg (ref 26.0–34.0)
MCHC: 32.4 g/dL (ref 30.0–36.0)
MCV: 94.4 fL (ref 80.0–100.0)
Monocytes Absolute: 0.6 10*3/uL (ref 0.1–1.0)
Monocytes Relative: 12 %
Neutro Abs: 4 10*3/uL (ref 1.7–7.7)
Neutrophils Relative %: 77 %
Platelets: 201 10*3/uL (ref 150–400)
RBC: 5 MIL/uL (ref 3.87–5.11)
RDW: 11.5 % (ref 11.5–15.5)
WBC: 5.3 10*3/uL (ref 4.0–10.5)
nRBC: 0 % (ref 0.0–0.2)

## 2022-08-26 LAB — POCT INFLUENZA A/B
Influenza A, POC: NEGATIVE
Influenza B, POC: NEGATIVE

## 2022-08-26 LAB — COMPREHENSIVE METABOLIC PANEL
ALT: 22 U/L (ref 0–44)
AST: 24 U/L (ref 15–41)
Albumin: 4.6 g/dL (ref 3.5–5.0)
Alkaline Phosphatase: 67 U/L (ref 38–126)
Anion gap: 11 (ref 5–15)
BUN: 18 mg/dL (ref 8–23)
CO2: 31 mmol/L (ref 22–32)
Calcium: 9.6 mg/dL (ref 8.9–10.3)
Chloride: 90 mmol/L — ABNORMAL LOW (ref 98–111)
Creatinine, Ser: 0.67 mg/dL (ref 0.44–1.00)
GFR, Estimated: >60 mL/min (ref >60)
Glucose, Bld: 110 mg/dL — ABNORMAL HIGH (ref 70–99)
Potassium: 4 mmol/L (ref 3.5–5.1)
Sodium: 132 mmol/L — ABNORMAL LOW (ref 135–145)
Total Bilirubin: 0.5 mg/dL (ref 0.3–1.2)
Total Protein: 7.6 g/dL (ref 6.5–8.1)

## 2022-08-26 LAB — TROPONIN I (HIGH SENSITIVITY)
Troponin I (High Sensitivity): 13 ng/L (ref <18)
Troponin I (High Sensitivity): 14 ng/L (ref <18)

## 2022-08-26 LAB — LIPASE, BLOOD: Lipase: 40 U/L (ref 11–51)

## 2022-08-26 LAB — POCT RAPID STREP A (OFFICE): Rapid Strep A Screen: NEGATIVE

## 2022-08-26 LAB — POC COVID19 BINAXNOW: SARS Coronavirus 2 Ag: NEGATIVE

## 2022-08-26 MED ORDER — LABETALOL HCL 5 MG/ML IV SOLN
10.0000 mg | Freq: Once | INTRAVENOUS | Status: AC
  Administered 2022-08-26: 10 mg via INTRAVENOUS
  Filled 2022-08-26: qty 4

## 2022-08-26 NOTE — ED Provider Notes (Addendum)
[facility] EMERGENCY DEPT Provider Note   CSN: [id number] Arrival date & time: 08/26/22  1434     History  Chief Complaint  Patient presents with   Dizziness   Headache    Natalie Smith is a 76 y.o. female.   Dizziness Associated symptoms: headaches   Headache Associated symptoms: dizziness   Patient presents with headache for the last 2 days.  Has had URI type symptoms.  Has nausea.  Decreased appetite.  Has had runny nose.  Got seen by his PCP and negative flu COVID and strep testing.  Has had chills but does not have a thermometer at home.  Does not tend to get headaches.  Headache has been there for around 2 days now. Also has had dizziness.  States she has episodes where dizziness will come and go.  Tends to be when she stands up.  No ringing or ears.  States the headache is on the backside of her head but it has not been associated with the dizziness in the past.  No lateralizing numbness or weakness.  States it feels as if she is drunk at times.  Sent in for elevated blood pressure from PCP.  Does not have history of hypertension.  Systolics up to over 200 in the office.    Past Medical History:  Diagnosis Date   Allergic rhinitis    ALLERGIC RHINITIS 03/01/2010   Anxiety    ANXIETY 10/21/2008   CERVICAL RADICULOPATHY, RIGHT 02/19/2010   COPD (chronic obstructive pulmonary disease) (HCC)    EPIGASTRIC PAIN 02/19/2010   Gallstones 07/22/2011   GERD (gastroesophageal reflux disease) 08/09/2013   Headache(784.0) 04/21/2007   Hyperlipidemia    HYPERLIPIDEMIA 10/21/2008   LEG PAIN, LEFT 08/20/2009   RASH-NONVESICULAR 06/26/2007   THYROID DISORDER 04/21/2007   Thyroid nodule 1978   benign   Vitamin D deficiency 11/22/2011    Home Medications Prior to Admission medications   Medication Sig Start Date End Date Taking? Authorizing Provider  albuterol (VENTOLIN HFA) 108 (90 Base) MCG/ACT inhaler INHALE 2 PUFFS INTO THE LUNGS EVERY 6 HOURS AS NEEDED FOR WHEEZING  OR SHORTNESS OF BREATH 12/10/20   Philip Aspen, Limmie Patricia, MD  buPROPion (WELLBUTRIN XL) 150 MG 24 hr tablet TAKE 1 TABLET(150 MG) BY MOUTH DAILY 06/21/22   Philip Aspen, Limmie Patricia, MD  fluticasone-salmeterol (ADVAIR) 250-50 MCG/ACT AEPB Inhale 1 puff into the lungs in the morning and at bedtime. 07/06/22   Philip Aspen, Limmie Patricia, MD  LORazepam (ATIVAN) 0.5 MG tablet TAKE 1 TABLET BY MOUTH TWICE DAILY AS NEEDED FOR ANXIETY 08/03/22   Philip Aspen, Limmie Patricia, MD  umeclidinium bromide (INCRUSE ELLIPTA) 62.5 MCG/ACT AEPB Inhale 1 puff into the lungs daily. 01/20/22   Luciano Cutter, MD      Allergies    Codeine and Tramadol    Review of Systems   Review of Systems  Neurological:  Positive for dizziness and headaches.    Physical Exam Updated Vital Signs BP (!) 153/76   Pulse 81   Temp 97.9 F (36.6 C) (Temporal)   Resp (!) 31   SpO2 91%  Physical Exam Vitals and nursing note reviewed.  HENT:     Head: Atraumatic.  Eyes:     Comments: Nystagmus with gaze to left.  Conjugate vision otherwise.  Cardiovascular:     Rate and Rhythm: Regular rhythm.  Pulmonary:     Breath sounds: No wheezing or rhonchi.  Skin:    General: Skin is warm.  Neurological:  Mental Status: She is alert and oriented to person, place, and time.     Comments: Awake and appropriate.  Finger-nose intact bilaterally.  Psychiatric:        Behavior: Behavior normal.     ED Results / Procedures / Treatments   Labs (all labs ordered are listed, but only abnormal results are displayed) Labs Reviewed  CBC WITH DIFFERENTIAL/PLATELET - Abnormal; Notable for the following components:      Result Value   Hemoglobin 15.3 (*)    HCT 47.2 (*)    Lymphs Abs 0.6 (*)    All other components within normal limits  COMPREHENSIVE METABOLIC PANEL - Abnormal; Notable for the following components:   Sodium 132 (*)    Chloride 90 (*)    Glucose, Bld 110 (*)    All other components within normal limits   LIPASE, BLOOD  TROPONIN I (HIGH SENSITIVITY)  TROPONIN I (HIGH SENSITIVITY)    EKG EKG Interpretation  Date/Time:  Thursday August 26 2022 15:10:20 EST Ventricular Rate:  92 PR Interval:  126 QRS Duration: 82 QT Interval:  362 QTC Calculation: 447 R Axis:   78 Text Interpretation: Normal sinus rhythm Possible Left atrial enlargement Anterior infarct , age undetermined Abnormal ECG When compared with ECG of 03-Oct-2018 12:43, No significant change since last tracing Confirmed by Benjiman Core [id number]) on 08/26/2022 4:25:43 PM  Radiology DG Chest Portable 1 View  Result Date: 08/26/2022 CLINICAL DATA:  Two day history of headache, dizziness EXAM: PORTABLE CHEST 1 VIEW COMPARISON:  Chest radiograph dated 06/12/2021 FINDINGS: Normal lung volumes. No focal consolidations. No pleural effusion or pneumothorax. The heart size and mediastinal contours are within normal limits. The visualized skeletal structures are unremarkable. IMPRESSION: No active disease. Electronically Signed   By: Agustin Cree M.D.   On: 08/26/2022 18:19   CT Head Wo Contrast  Result Date: 08/26/2022 CLINICAL DATA:  Syncope/presyncope EXAM: CT HEAD WITHOUT CONTRAST TECHNIQUE: Contiguous axial images were obtained from the base of the skull through the vertex without intravenous contrast. RADIATION DOSE REDUCTION: This exam was performed according to the departmental dose-optimization program which includes automated exposure control, adjustment of the mA and/or kV according to patient size and/or use of iterative reconstruction technique. COMPARISON:  None Available. FINDINGS: Brain: No evidence of acute infarction, hemorrhage, hydrocephalus, extra-axial collection or mass lesion/mass effect. Vascular: No hyperdense vessel. Skull: No acute fracture. Sinuses/Orbits: Clear sinuses.  No acute orbital findings. Other: No mastoid effusions. IMPRESSION: No evidence of acute intracranial abnormality. Electronically Signed   By:  Feliberto Harts M.D.   On: 08/26/2022 17:02    Procedures Procedures    Medications Ordered in ED Medications  labetalol (NORMODYNE) injection 10 mg (10 mg Intravenous Given 08/26/22 2014)    ED Course/ Medical Decision Making/ A&P                           Medical Decision Making Amount and/or Complexity of Data Reviewed Radiology: ordered.  Risk Prescription drug management. Decision regarding hospitalization.   Patient with dizziness headache and URI type symptoms.  Dizziness has been on and off for the last month.  Comes at times with difficulty walking.  Differential diagnosis includes URI symptoms, stroke, hypertension related symptoms.  Has hypertension.  Potentially URI symptoms, but negative flu COVID and strep testing at office.  I reviewed the office note.  However is also hypertensive which could cause some of the symptoms of dizziness but has not  necessarily been diagnosed in the past.  Also not necessarily feeling as dizzy as she had in the past right now.  Also headache.  Will get head CT.  Head CT reassuring.  Lab work reassuring.  Continues to be hypertensive with no history.  Stable EKG from prior, however with focal neurodeficits and hypertension I feel she likely needs more urgent evaluation.  Has episodic dizziness does have some nystagmus, however central cause not completely ruled out but felt less likely.  Will gently control blood pressure in case there is still some risk of stroke.  Will give dose of labetalol but will admit to hospital for more workup of the dizziness and controlled hypertension.  Discussed with Dr. Loney Loh.        Final Clinical Impression(s) / ED Diagnoses Final diagnoses:  Hypertensive crisis  Dizziness  Upper respiratory tract infection, unspecified type    Rx / DC Orders ED Discharge Orders     None         Benjiman Core, MD 08/26/22 2011    Benjiman Core, MD 08/26/22 2053

## 2022-08-26 NOTE — ED Notes (Signed)
Pillow given, bed linens straightened, pt. Made aware of possible wait time for a bed assignment

## 2022-08-26 NOTE — ED Triage Notes (Signed)
Pt arrives to ED urgently from PCP with c/o headache x2 days, dizziness (for along time), and gait imbalance for months. She saw her PCP and her BP was over 200 systolic. Associated symptoms include GI upset, runny nose x2 days.

## 2022-08-26 NOTE — Plan of Care (Signed)
[facility] will assume care on arrival to accepting facility. Until arrival, care as per EDP. However, [facility] available 24/7 for questions and assistance.  Nursing staff, please page [facility] Admits and Consults ([phone]) as soon as the patient arrives to the hospital.   
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
## 2022-08-27 ENCOUNTER — Observation Stay (HOSPITAL_COMMUNITY): Payer: Medicare Other

## 2022-08-27 ENCOUNTER — Encounter (HOSPITAL_COMMUNITY): Payer: Self-pay | Admitting: Internal Medicine

## 2022-08-27 DIAGNOSIS — J440 Chronic obstructive pulmonary disease with acute lower respiratory infection: Secondary | ICD-10-CM | POA: Diagnosis not present

## 2022-08-27 DIAGNOSIS — J449 Chronic obstructive pulmonary disease, unspecified: Secondary | ICD-10-CM | POA: Diagnosis not present

## 2022-08-27 DIAGNOSIS — B20 Human immunodeficiency virus [HIV] disease: Secondary | ICD-10-CM | POA: Diagnosis not present

## 2022-08-27 DIAGNOSIS — I6782 Cerebral ischemia: Secondary | ICD-10-CM | POA: Diagnosis not present

## 2022-08-27 DIAGNOSIS — F32A Depression, unspecified: Secondary | ICD-10-CM

## 2022-08-27 DIAGNOSIS — E876 Hypokalemia: Secondary | ICD-10-CM | POA: Diagnosis not present

## 2022-08-27 DIAGNOSIS — I619 Nontraumatic intracerebral hemorrhage, unspecified: Secondary | ICD-10-CM | POA: Diagnosis not present

## 2022-08-27 DIAGNOSIS — R29818 Other symptoms and signs involving the nervous system: Secondary | ICD-10-CM | POA: Diagnosis not present

## 2022-08-27 DIAGNOSIS — R42 Dizziness and giddiness: Secondary | ICD-10-CM | POA: Diagnosis not present

## 2022-08-27 DIAGNOSIS — F419 Anxiety disorder, unspecified: Secondary | ICD-10-CM | POA: Diagnosis not present

## 2022-08-27 DIAGNOSIS — Z87891 Personal history of nicotine dependence: Secondary | ICD-10-CM | POA: Diagnosis not present

## 2022-08-27 DIAGNOSIS — I160 Hypertensive urgency: Secondary | ICD-10-CM | POA: Diagnosis not present

## 2022-08-27 DIAGNOSIS — Z79899 Other long term (current) drug therapy: Secondary | ICD-10-CM | POA: Diagnosis not present

## 2022-08-27 DIAGNOSIS — I10 Essential (primary) hypertension: Secondary | ICD-10-CM | POA: Insufficient documentation

## 2022-08-27 DIAGNOSIS — J069 Acute upper respiratory infection, unspecified: Secondary | ICD-10-CM | POA: Diagnosis not present

## 2022-08-27 LAB — COMPREHENSIVE METABOLIC PANEL
ALT: 28 U/L (ref 0–44)
AST: 31 U/L (ref 15–41)
Albumin: 3.6 g/dL (ref 3.5–5.0)
Alkaline Phosphatase: 61 U/L (ref 38–126)
Anion gap: 12 (ref 5–15)
BUN: 21 mg/dL (ref 8–23)
CO2: 29 mmol/L (ref 22–32)
Calcium: 8.8 mg/dL — ABNORMAL LOW (ref 8.9–10.3)
Chloride: 92 mmol/L — ABNORMAL LOW (ref 98–111)
Creatinine, Ser: 0.7 mg/dL (ref 0.44–1.00)
GFR, Estimated: >60 mL/min (ref >60)
Glucose, Bld: 87 mg/dL (ref 70–99)
Potassium: 3.6 mmol/L (ref 3.5–5.1)
Sodium: 133 mmol/L — ABNORMAL LOW (ref 135–145)
Total Bilirubin: 0.5 mg/dL (ref 0.3–1.2)
Total Protein: 6.7 g/dL (ref 6.5–8.1)

## 2022-08-27 LAB — CBC
HCT: 44.9 % (ref 36.0–46.0)
Hemoglobin: 14.6 g/dL (ref 12.0–15.0)
MCH: 30.9 pg (ref 26.0–34.0)
MCHC: 32.5 g/dL (ref 30.0–36.0)
MCV: 94.9 fL (ref 80.0–100.0)
Platelets: 207 10*3/uL (ref 150–400)
RBC: 4.73 MIL/uL (ref 3.87–5.11)
RDW: 11.5 % (ref 11.5–15.5)
WBC: 4.5 10*3/uL (ref 4.0–10.5)
nRBC: 0 % (ref 0.0–0.2)

## 2022-08-27 MED ORDER — LABETALOL HCL 5 MG/ML IV SOLN: 5.0000 mg | INTRAVENOUS | Status: DC | PRN

## 2022-08-27 MED ORDER — ALBUTEROL SULFATE (2.5 MG/3ML) 0.083% IN NEBU: 2.5000 mg | INHALATION_SOLUTION | RESPIRATORY_TRACT | Status: DC | PRN

## 2022-08-27 MED ORDER — BUPROPION HCL ER (XL) 150 MG PO TB24
150.0000 mg | ORAL_TABLET | Freq: Every day | ORAL | Status: DC
  Administered 2022-08-27 – 2022-08-28 (×2): 150 mg via ORAL
  Filled 2022-08-27 (×3): qty 1

## 2022-08-27 MED ORDER — ACETAMINOPHEN 325 MG PO TABS: 650.0000 mg | ORAL_TABLET | Freq: Four times a day (QID) | ORAL | Status: DC | PRN

## 2022-08-27 MED ORDER — MOMETASONE FURO-FORMOTEROL FUM 200-5 MCG/ACT IN AERO
2.0000 | INHALATION_SPRAY | Freq: Two times a day (BID) | RESPIRATORY_TRACT | Status: DC
  Administered 2022-08-27 – 2022-08-28 (×3): 2 via RESPIRATORY_TRACT
  Filled 2022-08-27: qty 8.8

## 2022-08-27 MED ORDER — ALBUTEROL SULFATE HFA 108 (90 BASE) MCG/ACT IN AERS
2.0000 | INHALATION_SPRAY | RESPIRATORY_TRACT | Status: DC | PRN
  Filled 2022-08-27: qty 6.7

## 2022-08-27 MED ORDER — POLYETHYLENE GLYCOL 3350 17 G PO PACK: 17.0000 g | PACK | Freq: Every day | ORAL | Status: DC | PRN

## 2022-08-27 MED ORDER — LORAZEPAM 0.5 MG PO TABS
0.5000 mg | ORAL_TABLET | Freq: Two times a day (BID) | ORAL | Status: DC | PRN
  Administered 2022-08-27: 0.5 mg via ORAL
  Filled 2022-08-27: qty 1

## 2022-08-27 MED ORDER — ACETAMINOPHEN 650 MG RE SUPP: 650.0000 mg | Freq: Four times a day (QID) | RECTAL | Status: DC | PRN

## 2022-08-27 MED ORDER — SODIUM CHLORIDE 0.9% FLUSH
3.0000 mL | Freq: Two times a day (BID) | INTRAVENOUS | Status: DC
  Administered 2022-08-27 – 2022-08-28 (×2): 3 mL via INTRAVENOUS

## 2022-08-27 NOTE — ED Notes (Signed)
10:35 [name] at [facility] will send transport.-ABB(NS)

## 2022-08-27 NOTE — TOC Initial Note (Signed)
Transition of Care [facility]) - Initial/Assessment Note    Patient Details  Name: Natalie Smith MRN: [id number] Date of Birth: 28-Mar-1946  Transition of Care [facility] CM/SW Contact:    Lockie Pares, RN Phone Number: 08/27/2022, 12:49 PM  Clinical Narrative:                 TOC reviewed patient and finds no needs at this time. We will monitor patient in daily progressive rounds. If needs are identified, please place a TOC consult        Patient Goals and CMS Choice            Expected Discharge Plan and Services                                              Prior Living Arrangements/Services                       Activities of Daily Living      Permission Sought/Granted                  Emotional Assessment              Admission diagnosis:  Hypertensive crisis [I16.9] Dizziness [R42] Upper respiratory tract infection, unspecified type [J06.9] Patient Active Problem List   Diagnosis Date Noted   HTN (hypertension) 08/27/2022   Depression, recurrent (HCC) 01/24/2020   Nasal obstruction 09/15/2018   Dizziness 01/31/2018   Recurrent falls 01/31/2018   Localized swelling, mass and lump, neck 01/31/2018   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   COPD, severe (HCC) 08/26/2016   Hypersomnolence 02/26/2016   Microhematuria 02/26/2016   Fatigue 10/21/2015   Left lumbar radiculopathy 02/13/2015   Polycythemia 02/13/2015   GERD (gastroesophageal reflux disease) 08/09/2013   Tobacco use disorder 01/31/2013   Osteopenia 01/31/2013   Vitamin D deficiency 11/22/2011   Thoracic compression fracture (HCC) 11/22/2011   Gallstones 07/22/2011   Preventative health care 02/07/2011   Allergic rhinitis 03/01/2010   CERVICAL RADICULOPATHY, RIGHT 02/19/2010   Hyperlipidemia 10/21/2008   Anxiety and depression 10/21/2008   Disorder of thyroid 04/21/2007   Headache(784.0) 04/21/2007   PCP:  Philip Aspen, Limmie Patricia, MD Pharmacy:   [facility] [phone] - [place], [place] - [address] AT [facility] OF [address] [place] [address] Phone: [phone] Fax: [phone]     Social Determinants of Health (SDOH) Social History: SDOH Screenings   Food Insecurity: Food Insecurity Present (04/26/2022)  Housing: Low Risk  (04/26/2022)  Transportation Needs: No Transportation Needs (04/26/2022)  Alcohol Screen: Low Risk  (04/26/2022)  Depression (PHQ2-9): Medium Risk (08/26/2022)  Financial Resource Strain: Medium Risk (04/26/2022)  Physical Activity: Unknown (04/26/2022)  Social Connections: Moderately Integrated (04/26/2022)  Stress: Stress Concern Present (04/26/2022)  Tobacco Use: Medium Risk (08/27/2022)   SDOH Interventions:     Readmission Risk Interventions     No data to display
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
## 2022-08-27 NOTE — ED Notes (Signed)
Placed on 2LPM via [place] while sleeping. O2 88% while sleeping. Pt reports she used to use supplemental O2 while sleeping at home, but had stopped.

## 2022-08-27 NOTE — ED Notes (Signed)
Report called to Sam, inpatient RN

## 2022-08-27 NOTE — ED Notes (Signed)
Pt. Resting with eyes open, resps even and unlabored, denies pain

## 2022-08-27 NOTE — Progress Notes (Signed)
Patient arrived to 3#31 in NAD, free from pain. She is requesting to wash up before we hook her to any monitors so I will allow her to do that and then get VS and tele monitor applied.

## 2022-08-28 DIAGNOSIS — I169 Hypertensive crisis, unspecified: Secondary | ICD-10-CM | POA: Diagnosis not present

## 2022-08-28 DIAGNOSIS — R42 Dizziness and giddiness: Secondary | ICD-10-CM | POA: Diagnosis not present

## 2022-08-28 DIAGNOSIS — J069 Acute upper respiratory infection, unspecified: Secondary | ICD-10-CM | POA: Diagnosis not present

## 2022-08-28 DIAGNOSIS — I160 Hypertensive urgency: Secondary | ICD-10-CM | POA: Diagnosis present

## 2022-08-28 LAB — CBC
HCT: 40.5 % (ref 36.0–46.0)
Hemoglobin: 13.2 g/dL (ref 12.0–15.0)
MCH: 30.8 pg (ref 26.0–34.0)
MCHC: 32.6 g/dL (ref 30.0–36.0)
MCV: 94.4 fL (ref 80.0–100.0)
Platelets: 166 10*3/uL (ref 150–400)
RBC: 4.29 MIL/uL (ref 3.87–5.11)
RDW: 11.5 % (ref 11.5–15.5)
WBC: 3.8 10*3/uL — ABNORMAL LOW (ref 4.0–10.5)
nRBC: 0 % (ref 0.0–0.2)

## 2022-08-28 LAB — COMPREHENSIVE METABOLIC PANEL
ALT: 22 U/L (ref 0–44)
AST: 24 U/L (ref 15–41)
Albumin: 3.1 g/dL — ABNORMAL LOW (ref 3.5–5.0)
Alkaline Phosphatase: 54 U/L (ref 38–126)
Anion gap: 10 (ref 5–15)
BUN: 22 mg/dL (ref 8–23)
CO2: 29 mmol/L (ref 22–32)
Calcium: 8.5 mg/dL — ABNORMAL LOW (ref 8.9–10.3)
Chloride: 93 mmol/L — ABNORMAL LOW (ref 98–111)
Creatinine, Ser: 0.68 mg/dL (ref 0.44–1.00)
GFR, Estimated: >60 mL/min (ref >60)
Glucose, Bld: 91 mg/dL (ref 70–99)
Potassium: 3.3 mmol/L — ABNORMAL LOW (ref 3.5–5.1)
Sodium: 132 mmol/L — ABNORMAL LOW (ref 135–145)
Total Bilirubin: 0.3 mg/dL (ref 0.3–1.2)
Total Protein: 5.6 g/dL — ABNORMAL LOW (ref 6.5–8.1)

## 2022-08-28 LAB — TSH: TSH: 0.308 u[IU]/mL — ABNORMAL LOW (ref 0.350–4.500)

## 2022-08-28 LAB — CORTISOL: Cortisol, Plasma: 7.1 ug/dL

## 2022-08-28 LAB — FOLATE: Folate: 15.8 ng/mL (ref >5.9)

## 2022-08-28 LAB — VITAMIN B12: Vitamin B-12: 536 pg/mL (ref 180–914)

## 2022-08-28 MED ORDER — PREDNISONE 20 MG PO TABS
40.0000 mg | ORAL_TABLET | Freq: Every day | ORAL | Status: DC
  Administered 2022-08-28: 40 mg via ORAL
  Filled 2022-08-28: qty 2

## 2022-08-28 MED ORDER — ONDANSETRON HCL 4 MG/2ML IJ SOLN: 4.0000 mg | Freq: Four times a day (QID) | INTRAMUSCULAR | Status: DC | PRN

## 2022-08-28 MED ORDER — AMOXICILLIN-POT CLAVULANATE 875-125 MG PO TABS
1.0000 | ORAL_TABLET | Freq: Two times a day (BID) | ORAL | Status: DC
  Administered 2022-08-28: 1 via ORAL
  Filled 2022-08-28: qty 1

## 2022-08-28 MED ORDER — HYDROCOD POLI-CHLORPHE POLI ER 10-8 MG/5ML PO SUER: 5.0000 mL | Freq: Two times a day (BID) | ORAL | 0 refills | Status: DC | PRN

## 2022-08-28 MED ORDER — BENZONATATE 100 MG PO CAPS: 100.0000 mg | ORAL_CAPSULE | Freq: Three times a day (TID) | ORAL | 0 refills | Status: DC | PRN

## 2022-08-28 MED ORDER — MECLIZINE HCL 25 MG PO TABS
25.0000 mg | ORAL_TABLET | Freq: Three times a day (TID) | ORAL | Status: DC
  Administered 2022-08-28: 25 mg via ORAL
  Filled 2022-08-28 (×3): qty 1

## 2022-08-28 MED ORDER — POTASSIUM CHLORIDE CRYS ER 20 MEQ PO TBCR
40.0000 meq | EXTENDED_RELEASE_TABLET | Freq: Once | ORAL | Status: AC
  Administered 2022-08-28: 40 meq via ORAL
  Filled 2022-08-28 (×2): qty 2

## 2022-08-28 MED ORDER — SODIUM CHLORIDE 0.9 % IV SOLN: INTRAVENOUS | Status: DC

## 2022-08-28 MED ORDER — MECLIZINE HCL 25 MG PO TABS: 25.0000 mg | ORAL_TABLET | Freq: Three times a day (TID) | ORAL | 1 refills | Status: DC | PRN

## 2022-08-28 MED ORDER — BENZONATATE 100 MG PO CAPS
100.0000 mg | ORAL_CAPSULE | Freq: Three times a day (TID) | ORAL | Status: DC
  Administered 2022-08-28: 100 mg via ORAL
  Filled 2022-08-28: qty 1

## 2022-08-28 MED ORDER — PANTOPRAZOLE SODIUM 40 MG PO TBEC: 40.0000 mg | DELAYED_RELEASE_TABLET | Freq: Every day | ORAL | 1 refills | Status: DC

## 2022-08-28 MED ORDER — PANTOPRAZOLE SODIUM 40 MG PO TBEC
40.0000 mg | DELAYED_RELEASE_TABLET | Freq: Every day | ORAL | Status: DC
  Administered 2022-08-28: 40 mg via ORAL
  Filled 2022-08-28: qty 1

## 2022-08-28 MED ORDER — ONDANSETRON HCL 4 MG PO TABS: 4.0000 mg | ORAL_TABLET | Freq: Three times a day (TID) | ORAL | 0 refills | Status: DC | PRN

## 2022-08-28 MED ORDER — HYDROCOD POLI-CHLORPHE POLI ER 10-8 MG/5ML PO SUER
5.0000 mL | Freq: Two times a day (BID) | ORAL | Status: DC
  Administered 2022-08-28: 5 mL via ORAL
  Filled 2022-08-28: qty 5

## 2022-08-28 MED ORDER — AMOXICILLIN-POT CLAVULANATE 875-125 MG PO TABS: 1.0000 | ORAL_TABLET | Freq: Two times a day (BID) | ORAL | 0 refills | Status: AC

## 2022-08-28 MED ORDER — PREDNISONE 20 MG PO TABS: 40.0000 mg | ORAL_TABLET | Freq: Every day | ORAL | 0 refills | Status: AC

## 2022-08-28 MED ORDER — AMLODIPINE BESYLATE 5 MG PO TABS
5.0000 mg | ORAL_TABLET | Freq: Every day | ORAL | Status: DC
  Administered 2022-08-28: 5 mg via ORAL
  Filled 2022-08-28: qty 1

## 2022-08-28 MED ORDER — AMLODIPINE BESYLATE 5 MG PO TABS: 5.0000 mg | ORAL_TABLET | Freq: Every day | ORAL | 3 refills | Status: DC

## 2022-08-28 NOTE — Progress Notes (Signed)
[facility]                                                                              Natalie Smith, is a 76 y.o. female, DOB - 16-Dec-1945, WLS:[id number] Admit date - 08/26/2022    Outpatient Primary MD for the patient is Philip Aspen, Limmie Patricia, MD  LOS - 0  days  Chief Complaint  Patient presents with   Dizziness   Headache       Brief summary   Patient is a 76 year old female with history of HIV, GERD, polycythemia, anxiety, COPD, not on home O2 presented with headache in last 2 days and URI symptoms for the past week.  Patient reported that she has been having URI symptoms with rhinorrhea, congestion, cough, bronchitis in the last week and subsequently in the last 2 days started having a headache, nausea, dizziness and unsteady gait.  She did have intermittent dizziness for the past month.  Patient presented to her PCP office and patient was noted to have persistent SBP above 200 with headache, dizziness.  Patient was sent to the ED for further workup. In ED, initial BP 210/120, received IV labetalol.  RR 20s, with hypoxia documented at 88% while sleeping. Labs WNL.  Lipase negative, troponin negative.  COVID, flu, strep a negative Chest x-ray showed no acute abnormality. CT head showed no acute abnormality   Assessment & Plan    Principal Problem:   Dizziness likely due to BPPV or vestibular neuritis -Likely due to URI and bronchitis for last 1 week, has history of COPD, hypertensive urgency. -Presented with nausea vomiting, dizziness, on exam noted to have nystagmus, MRI of the brain negative for acute cerebellar CVA -Will place on meclizine, PT for vestibular evaluation.  Supportive treatment with antiemetic  Active Problems: Acute bronchitis with underlying history of COPD -Placed on prednisone 40 mg daily, Augmentin, Tussionex every 12 hours, Tessalon Perles -Continue Dulera, albuterol nebs, incentive  spirometry  Hypokalemia -Replaced  Hypertensive urgency -Presented with a BP of 210/120 with headache, dizziness -Per patient, not on any antihypertensives and last BP in August was 130s systolic -BP improving, will place on Norvasc 5 mg daily -MRI brain negative for acute CVA     GERD (gastroesophageal reflux disease) -Placed on PPI    Anxiety and depression -Continue Wellbutrin XL, Ativan as needed   Code Status: Full CODE STATUS DVT Prophylaxis:  SCDs Start: 08/27/22 1220   Level of Care: Level of care: Telemetry Medical Family Communication: Updated patient   Disposition Plan:      Remains inpatient appropriate: Still nauseous, dizzy, room spinning feeling, hopefully DC home in a.m. if improving   Procedures:  MRI brain  Consultants:   None Antimicrobials:   Anti-infectives (From admission, onward)    Start     Dose/Rate Route Frequency Ordered Stop   08/28/22 1045  amoxicillin-clavulanate (AUGMENTIN) 875-125 MG per tablet 1 tablet        1 tablet Oral Every 12 hours 08/28/22 0945            Medications  amLODipine  5 mg Oral Daily   amoxicillin-clavulanate  1 tablet Oral  Q12H   benzonatate  100 mg Oral TID   buPROPion  150 mg Oral Daily   chlorpheniramine-HYDROcodone  5 mL Oral Q12H   meclizine  25 mg Oral TID   mometasone-formoterol  2 puff Inhalation BID   potassium chloride  40 mEq Oral Once   predniSONE  40 mg Oral QAC breakfast   sodium chloride flush  3 mL Intravenous Q12H      Subjective:   Natalie Smith was seen and examined today.  Coughing, feels congested, BP somewhat still elevated.  Still having nausea, dizziness, headache.  No chest pain, abdominal pain, N/V/D/C, new weakness, numbess, tingling. No acute events overnight.    Objective:   Vitals:   08/27/22 1653 08/27/22 2121 08/28/22 0622 08/28/22 0723  BP: (!) 156/77 (!) 152/90 (!) 167/77 (!) 152/95  Pulse: 89 91 83 80  Resp: 20 18  16   Temp: 98.7 F (37.1 C) 98.9 F  (37.2 C) 98.6 F (37 C) 98.2 F (36.8 C)  TempSrc: Oral Oral Oral Oral  SpO2: 99% 93% 91% 90%  Weight:      Height:        Intake/Output Summary (Last 24 hours) at 08/28/2022 0951 Last data filed at 08/27/2022 1656 Gross per 24 hour  Intake 360 ml  Output --  Net 360 ml     Wt Readings from Last 3 Encounters:  08/27/22 73.1 kg  08/26/22 73.1 kg  04/26/22 72.9 kg     Exam General: Alert and oriented x 3, NAD Cardiovascular: S1 S2 auscultated,  RRR Respiratory: Diminished sounds throughout Gastrointestinal: Soft, nontender, nondistended, + bowel sounds Ext: no pedal edema bilaterally Neuro: Strength 5/5 upper and lower extremities bilaterally Psych: Normal affect and demeanor, alert and oriented x3     Data Reviewed:  I have personally reviewed following labs    CBC Lab Results  Component Value Date   WBC 3.8 (L) 08/28/2022   RBC 4.29 08/28/2022   HGB 13.2 08/28/2022   HCT 40.5 08/28/2022   MCV 94.4 08/28/2022   MCH 30.8 08/28/2022   PLT 166 08/28/2022   MCHC 32.6 08/28/2022   RDW 11.5 08/28/2022   LYMPHSABS 0.6 (L) 08/26/2022   MONOABS 0.6 08/26/2022   EOSABS 0.0 08/26/2022   BASOSABS 0.0 08/26/2022     Last metabolic panel Lab Results  Component Value Date   NA 132 (L) 08/28/2022   K 3.3 (L) 08/28/2022   CL 93 (L) 08/28/2022   CO2 29 08/28/2022   BUN 22 08/28/2022   CREATININE 0.68 08/28/2022   GLUCOSE 91 08/28/2022   GFRNONAA >60 08/28/2022   GFRAA >60 10/04/2018   CALCIUM 8.5 (L) 08/28/2022   PROT 5.6 (L) 08/28/2022   ALBUMIN 3.1 (L) 08/28/2022   BILITOT 0.3 08/28/2022   ALKPHOS 54 08/28/2022   AST 24 08/28/2022   ALT 22 08/28/2022   ANIONGAP 10 08/28/2022    CBG (last 3)  No results for input(s): "GLUCAP" in the last 72 hours.    Coagulation Profile: No results for input(s): "INR", "PROTIME" in the last 168 hours.   Radiology Studies: I have personally reviewed the imaging studies  MR BRAIN WO CONTRAST  Result Date:  08/28/2022 CLINICAL DATA:  Initial evaluation for neuro deficit, stroke suspected. EXAM: MRI HEAD WITHOUT CONTRAST TECHNIQUE: Multiplanar, multiecho pulse sequences of the brain and surrounding structures were obtained without intravenous contrast. COMPARISON:  Prior head CT from 08/26/2022. FINDINGS: Brain: Cerebral volume within normal limits for age. Scattered patchy T2/FLAIR hyperintensity involving  the periventricular and deep white matter both cerebral hemispheres as well as the pons, consistent with chronic small vessel ischemic disease, moderately advanced in nature. No abnormal foci of restricted diffusion to suggest acute or subacute ischemia. Gray-white matter differentiation maintained. No areas of chronic cortical infarction. No acute intracranial hemorrhage. Single punctate chronic microhemorrhage noted at the posterior left frontal corona radiata, likely small vessel related. No mass lesion, midline shift or mass effect. No hydrocephalus or extra-axial fluid collection. Pituitary gland and suprasellar region within normal limits. Vascular: Major intracranial vascular flow voids are maintained. Skull and upper cervical spine: Craniocervical junction normal. Bone marrow signal intensity within normal limits. No scalp soft tissue abnormality. Sinuses/Orbits: Patient status post bilateral ocular lens replacement. Paranasal sinuses are largely clear. No significant mastoid effusion. Other: None. IMPRESSION: 1. No acute intracranial abnormality. 2. Moderately advanced chronic microvascular ischemic disease. Electronically Signed   By: Rise Mu M.D.   On: 08/28/2022 01:58   DG Chest Portable 1 View  Result Date: 08/26/2022 CLINICAL DATA:  Two day history of headache, dizziness EXAM: PORTABLE CHEST 1 VIEW COMPARISON:  Chest radiograph dated 06/12/2021 FINDINGS: Normal lung volumes. No focal consolidations. No pleural effusion or pneumothorax. The heart size and mediastinal contours are  within normal limits. The visualized skeletal structures are unremarkable. IMPRESSION: No active disease. Electronically Signed   By: Agustin Cree M.D.   On: 08/26/2022 18:19   CT Head Wo Contrast  Result Date: 08/26/2022 CLINICAL DATA:  Syncope/presyncope EXAM: CT HEAD WITHOUT CONTRAST TECHNIQUE: Contiguous axial images were obtained from the base of the skull through the vertex without intravenous contrast. RADIATION DOSE REDUCTION: This exam was performed according to the departmental dose-optimization program which includes automated exposure control, adjustment of the mA and/or kV according to patient size and/or use of iterative reconstruction technique. COMPARISON:  None Available. FINDINGS: Brain: No evidence of acute infarction, hemorrhage, hydrocephalus, extra-axial collection or mass lesion/mass effect. Vascular: No hyperdense vessel. Skull: No acute fracture. Sinuses/Orbits: Clear sinuses.  No acute orbital findings. Other: No mastoid effusions. IMPRESSION: No evidence of acute intracranial abnormality. Electronically Signed   By: Feliberto Harts M.D.   On: 08/26/2022 17:02       Niam Nepomuceno M.D. [facility] 08/28/2022, 9:51 AM  Available via Epic secure chat 7am-7pm After 7 pm, please refer to night coverage provider listed on amion.

## 2022-08-28 NOTE — Care Management Obs Status (Signed)
MEDICARE OBSERVATION STATUS NOTIFICATION   Patient Details  Name: Makella Buckingham MRN: [id number] Date of Birth: 06-05-1946   Medicare Observation Status Notification Given:  Yes    Lawerance Sabal, RN 08/28/2022, 3:55 PM

## 2022-08-28 NOTE — Discharge Summary (Signed)
Physician Discharge Summary   Patient: Natalie Smith MRN: [id number] DOB: [birth year]  Admit date:     08/26/2022  Discharge date: 08/28/22  Discharge Physician: Thad Ranger, MD    PCP: Philip Aspen, Limmie Patricia, MD   Recommendations at discharge:    Started on norvasc 5mg  daily. Explained to check BP daily at home. If running low, may cut down dose in 1/2.  Placed on augmentin 1 tab BID x 7days for acute bronchitis superimposed on severe COPD. Outpatient follow-up with pulmonology recommended  Prednisone 40mg  PO daily x 4 more days   Discharge Diagnoses:    Dizziness    Hypokalemia   GERD (gastroesophageal reflux disease)   Anxiety and depression   Acute bronchitis with COPD, severe (HCC)   Depression, recurrent (HCC)   Hypertensive urgency   GERD   Hospital Course:  Patient is a 76 year old female with history of HIV, GERD, polycythemia, anxiety, COPD, not on home O2 presented with headache in last 2 days and URI symptoms for the past week.  Patient reported that she has been having URI symptoms with rhinorrhea, congestion, cough, bronchitis in the last week and subsequently in the last 2 days started having a headache, nausea, dizziness and unsteady gait.  She did have intermittent dizziness for the past month.  Patient presented to her PCP office and patient was noted to have persistent SBP above 200 with headache, dizziness.  Patient was sent to the ED for further workup. In ED, initial BP 210/120, received IV labetalol.  RR 20s, with hypoxia documented at 88% while sleeping. Labs WNL.  Lipase negative, troponin negative.  COVID, flu, strep a negative Chest x-ray showed no acute abnormality. CT head showed no acute abnormality  Assessment and Plan:  Dizziness  -Likely due to URI and bronchitis for last 1 week, has history of COPD, hypertensive urgency. -Presented with nausea vomiting, dizziness, on exam noted to have nystagmus, MRI of the brain negative for acute  cerebellar CVA -placed on meclizine as needed  -  PT for vestibular evaluation, patient had improved and had no BPPV on exam     Acute bronchitis with underlying history of COPD -Placed on prednisone 40 mg daily, Augmentin, Tussionex PRN for cough, Tessalon Perles -Continue Dulera, albuterol nebs, incentive spirometry   Hypokalemia -Replaced   Hypertensive urgency -Presented with a BP of 210/120 with headache, dizziness -Per patient, not on any antihypertensives and last BP in August was 130s systolic -BP improving, will place on Norvasc 5 mg daily -MRI brain negative for acute CVA       GERD (gastroesophageal reflux disease) -Placed on PPI     Anxiety and depression -Continue Wellbutrin XL, Ativan as needed      Pain control - [place] Controlled Substance Reporting System database was reviewed. and patient was instructed, not to drive, operate heavy machinery, perform activities at heights, swimming or participation in water activities or provide baby-sitting services while on Pain, Sleep and Anxiety Medications; until their outpatient Physician has advised to do so again. Also recommended to not to take more than prescribed Pain, Sleep and Anxiety Medications.  Consultants: none  Procedures performed: MRI brain  Disposition: Home Diet recommendation:  Discharge Diet Orders (From admission, onward)     Start     Ordered   08/28/22 0000  Diet - low sodium heart healthy        08/28/22 1647            DISCHARGE MEDICATION: Allergies as of  08/28/2022       Reactions   Codeine Nausea And Vomiting   Tramadol Itching, Nausea And Vomiting        Medication List     TAKE these medications    acetaminophen 325 MG tablet Commonly known as: TYLENOL Take 325 mg by mouth as needed for moderate pain.   amLODipine 5 MG tablet Commonly known as: NORVASC Take 1 tablet (5 mg total) by mouth daily. Start taking on: August 29, 2022   amoxicillin-clavulanate  875-125 MG tablet Commonly known as: AUGMENTIN Take 1 tablet by mouth 2 (two) times daily for 7 days.   benzonatate 100 MG capsule Commonly known as: TESSALON Take 1 capsule (100 mg total) by mouth 3 (three) times daily as needed for cough.   buPROPion 150 MG 24 hr tablet Commonly known as: WELLBUTRIN XL TAKE 1 TABLET(150 MG) BY MOUTH DAILY What changed: See the new instructions.   chlorpheniramine-HYDROcodone 10-8 MG/5ML Commonly known as: TUSSIONEX Take 5 mLs by mouth every 12 (twelve) hours as needed for cough.   fluticasone-salmeterol 250-50 MCG/ACT Aepb Commonly known as: ADVAIR Inhale 1 puff into the lungs in the morning and at bedtime.   Incruse Ellipta 62.5 MCG/ACT Aepb Generic drug: umeclidinium bromide Inhale 1 puff into the lungs daily.   LORazepam 0.5 MG tablet Commonly known as: ATIVAN TAKE 1 TABLET BY MOUTH TWICE DAILY AS NEEDED FOR ANXIETY What changed:  when to take this reasons to take this   meclizine 25 MG tablet Commonly known as: ANTIVERT Take 1 tablet (25 mg total) by mouth 3 (three) times daily as needed for dizziness (also available OTC).   multivitamin with minerals tablet Take 1 tablet by mouth daily.   ondansetron 4 MG tablet Commonly known as: Zofran Take 1 tablet (4 mg total) by mouth every 8 (eight) hours as needed for nausea or vomiting.   pantoprazole 40 MG tablet Commonly known as: PROTONIX Take 1 tablet (40 mg total) by mouth daily.   predniSONE 20 MG tablet Commonly known as: DELTASONE Take 2 tablets (40 mg total) by mouth daily before breakfast for 4 days. Start taking on: August 29, 2022        Follow-up Information     Philip Aspen, Limmie Patricia, MD. Schedule an appointment as soon as possible for a visit in 2 week(s).   Specialty: Internal Medicine Why: for hospital follow-up Contact information: [address] [facility] [place] [address] [phone]                Discharge Exam: Filed Weights    08/27/22 1304  Weight: 73.1 kg   S: patient feels better later in the day, no further dizziness, wants to go home   BP (!) 147/94 (BP Location: Right Arm)   Pulse 80   Temp 98 F (36.7 C) (Oral)   Resp 16   Ht 5\' 2"  (1.575 m)   Wt 73.1 kg   SpO2 91%   BMI 29.48 kg/m    Physical Exam General: Alert and oriented x 3, NAD Cardiovascular: S1 S2 clear, RRR.  Respiratory: CTAB, no wheezing, rales or rhonchi Gastrointestinal: Soft, nontender, nondistended, NBS Ext: no pedal edema bilaterally Neuro: no new deficits Psych: Normal affect    Condition at discharge: fair  The results of significant diagnostics from this hospitalization (including imaging, microbiology, ancillary and laboratory) are listed below for reference.   Imaging Studies: MR BRAIN WO CONTRAST  Result Date: 08/28/2022 CLINICAL DATA:  Initial evaluation for neuro deficit,  stroke suspected. EXAM: MRI HEAD WITHOUT CONTRAST TECHNIQUE: Multiplanar, multiecho pulse sequences of the brain and surrounding structures were obtained without intravenous contrast. COMPARISON:  Prior head CT from 08/26/2022. FINDINGS: Brain: Cerebral volume within normal limits for age. Scattered patchy T2/FLAIR hyperintensity involving the periventricular and deep white matter both cerebral hemispheres as well as the pons, consistent with chronic small vessel ischemic disease, moderately advanced in nature. No abnormal foci of restricted diffusion to suggest acute or subacute ischemia. Gray-white matter differentiation maintained. No areas of chronic cortical infarction. No acute intracranial hemorrhage. Single punctate chronic microhemorrhage noted at the posterior left frontal corona radiata, likely small vessel related. No mass lesion, midline shift or mass effect. No hydrocephalus or extra-axial fluid collection. Pituitary gland and suprasellar region within normal limits. Vascular: Major intracranial vascular flow voids are maintained. Skull and  upper cervical spine: Craniocervical junction normal. Bone marrow signal intensity within normal limits. No scalp soft tissue abnormality. Sinuses/Orbits: Patient status post bilateral ocular lens replacement. Paranasal sinuses are largely clear. No significant mastoid effusion. Other: None. IMPRESSION: 1. No acute intracranial abnormality. 2. Moderately advanced chronic microvascular ischemic disease. Electronically Signed   By: Rise Mu M.D.   On: 08/28/2022 01:58   DG Chest Portable 1 View  Result Date: 08/26/2022 CLINICAL DATA:  Two day history of headache, dizziness EXAM: PORTABLE CHEST 1 VIEW COMPARISON:  Chest radiograph dated 06/12/2021 FINDINGS: Normal lung volumes. No focal consolidations. No pleural effusion or pneumothorax. The heart size and mediastinal contours are within normal limits. The visualized skeletal structures are unremarkable. IMPRESSION: No active disease. Electronically Signed   By: Agustin Cree M.D.   On: 08/26/2022 18:19   CT Head Wo Contrast  Result Date: 08/26/2022 CLINICAL DATA:  Syncope/presyncope EXAM: CT HEAD WITHOUT CONTRAST TECHNIQUE: Contiguous axial images were obtained from the base of the skull through the vertex without intravenous contrast. RADIATION DOSE REDUCTION: This exam was performed according to the departmental dose-optimization program which includes automated exposure control, adjustment of the mA and/or kV according to patient size and/or use of iterative reconstruction technique. COMPARISON:  None Available. FINDINGS: Brain: No evidence of acute infarction, hemorrhage, hydrocephalus, extra-axial collection or mass lesion/mass effect. Vascular: No hyperdense vessel. Skull: No acute fracture. Sinuses/Orbits: Clear sinuses.  No acute orbital findings. Other: No mastoid effusions. IMPRESSION: No evidence of acute intracranial abnormality. Electronically Signed   By: Feliberto Harts M.D.   On: 08/26/2022 17:02    Microbiology: No results  found for this or any previous visit.  Labs: CBC: Recent Labs  Lab 08/26/22 1520 08/27/22 1245 08/28/22 0225  WBC 5.3 4.5 3.8*  NEUTROABS 4.0  --   --   HGB 15.3* 14.6 13.2  HCT 47.2* 44.9 40.5  MCV 94.4 94.9 94.4  PLT 201 207 166   Basic Metabolic Panel: Recent Labs  Lab 08/26/22 1520 08/27/22 1245 08/28/22 0225  NA 132* 133* 132*  K 4.0 3.6 3.3*  CL 90* 92* 93*  CO2 31 29 29   GLUCOSE 110* 87 91  BUN 18 21 22   CREATININE 0.67 0.70 0.68  CALCIUM 9.6 8.8* 8.5*   Liver Function Tests: Recent Labs  Lab 08/26/22 1520 08/27/22 1245 08/28/22 0225  AST 24 31 24   ALT 22 28 22   ALKPHOS 67 61 54  BILITOT 0.5 0.5 0.3  PROT 7.6 6.7 5.6*  ALBUMIN 4.6 3.6 3.1*   CBG: No results for input(s): "GLUCAP" in the last 168 hours.  Discharge time spent: greater than 30 minutes.  Signed:  Thad Rangeripudeep Casandra Dallaire, MD [facility] 08/28/2022

## 2022-08-28 NOTE — Progress Notes (Signed)
OT Cancellation Note  Patient Details Name: Natalie Smith MRN: [id number] DOB: 11/07/1945   Cancelled Treatment:    Reason Eval/Treat Not Completed: OT screened, no needs identified, will sign off  Pt seen by PT, and Pt reporting pt independent, does not need assist with ADL or IADL, and dizziness has subsided. OT to sign off. Thank you for this order.   Myrla Halsted 08/28/2022, 3:55 PM
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
## 2022-09-09 ENCOUNTER — Ambulatory Visit (INDEPENDENT_AMBULATORY_CARE_PROVIDER_SITE_OTHER): Payer: Medicare Other | Admitting: Internal Medicine

## 2022-09-09 VITALS — BP 167/81 | HR 103 | Temp 98.1°F | Wt 166.4 lb

## 2022-09-09 DIAGNOSIS — I10 Essential (primary) hypertension: Secondary | ICD-10-CM

## 2022-09-09 DIAGNOSIS — Z09 Encounter for follow-up examination after completed treatment for conditions other than malignant neoplasm: Secondary | ICD-10-CM

## 2022-09-09 DIAGNOSIS — I160 Hypertensive urgency: Secondary | ICD-10-CM

## 2022-09-09 MED ORDER — BENZONATATE 100 MG PO CAPS: 100.0000 mg | ORAL_CAPSULE | Freq: Three times a day (TID) | ORAL | 0 refills | Status: DC | PRN
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
## 2022-09-23 ENCOUNTER — Emergency Department (HOSPITAL_COMMUNITY)
Admission: EM | Admit: 2022-09-23 | Discharge: 2022-09-23 | Disposition: A | Discharge status: Home / Self Care | Payer: Medicare Other | Attending: Student | Admitting: Student

## 2022-09-23 ENCOUNTER — Emergency Department (HOSPITAL_COMMUNITY): Payer: Medicare Other

## 2022-09-23 DIAGNOSIS — S60222A Contusion of left hand, initial encounter: Secondary | ICD-10-CM | POA: Diagnosis not present

## 2022-09-23 DIAGNOSIS — Z7951 Long term (current) use of inhaled steroids: Secondary | ICD-10-CM | POA: Diagnosis not present

## 2022-09-23 DIAGNOSIS — S6992XA Unspecified injury of left wrist, hand and finger(s), initial encounter: Secondary | ICD-10-CM | POA: Diagnosis present

## 2022-09-23 DIAGNOSIS — J449 Chronic obstructive pulmonary disease, unspecified: Secondary | ICD-10-CM | POA: Diagnosis not present

## 2022-09-23 DIAGNOSIS — Z041 Encounter for examination and observation following transport accident: Secondary | ICD-10-CM | POA: Diagnosis not present

## 2022-09-23 DIAGNOSIS — Y92410 Unspecified street and highway as the place of occurrence of the external cause: Secondary | ICD-10-CM | POA: Diagnosis not present

## 2022-09-23 DIAGNOSIS — M79642 Pain in left hand: Secondary | ICD-10-CM | POA: Diagnosis not present
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
## 2022-09-23 NOTE — Discharge Instructions (Signed)
The pain you are experiencing is likely due to muscle strain and soft tissue injury, you may take tylenol as needed for pain management. You may also use ice and heat, and over-the-counter remedies such as Biofreeze gel or salon pas lidocaine patches. The muscle soreness should improve over the next week. Follow up with your family doctor in the next week for a recheck if you are still having symptoms. Return to ED if pain is worsening, you develop weakness or numbness of extremities, or new or concerning symptoms develop.

## 2022-09-24 DIAGNOSIS — H5319 Other subjective visual disturbances: Secondary | ICD-10-CM | POA: Diagnosis not present

## 2022-09-24 DIAGNOSIS — H04123 Dry eye syndrome of bilateral lacrimal glands: Secondary | ICD-10-CM | POA: Diagnosis not present

## 2022-09-24 DIAGNOSIS — H35033 Hypertensive retinopathy, bilateral: Secondary | ICD-10-CM | POA: Diagnosis not present

## 2022-09-24 DIAGNOSIS — H35363 Drusen (degenerative) of macula, bilateral: Secondary | ICD-10-CM | POA: Diagnosis not present

## 2022-09-26 ENCOUNTER — Other Ambulatory Visit: Payer: Self-pay | Admitting: Internal Medicine

## 2022-09-26 DIAGNOSIS — F419 Anxiety disorder, unspecified: Secondary | ICD-10-CM

## 2022-10-27 ENCOUNTER — Ambulatory Visit: Payer: Medicare Other | Admitting: Internal Medicine

## 2022-11-01 ENCOUNTER — Encounter: Payer: Medicare Other | Admitting: Internal Medicine

## 2022-11-02 ENCOUNTER — Encounter: Payer: Self-pay | Admitting: Internal Medicine

## 2022-11-02 ENCOUNTER — Ambulatory Visit (INDEPENDENT_AMBULATORY_CARE_PROVIDER_SITE_OTHER): Payer: Medicare Other | Admitting: Internal Medicine

## 2022-11-02 VITALS — BP 136/73 | HR 73 | Temp 97.9°F | Ht 62.0 in | Wt 164.5 lb

## 2022-11-02 DIAGNOSIS — Z1231 Encounter for screening mammogram for malignant neoplasm of breast: Secondary | ICD-10-CM

## 2022-11-02 DIAGNOSIS — E559 Vitamin D deficiency, unspecified: Secondary | ICD-10-CM

## 2022-11-02 DIAGNOSIS — F32A Depression, unspecified: Secondary | ICD-10-CM | POA: Diagnosis not present

## 2022-11-02 DIAGNOSIS — R319 Hematuria, unspecified: Secondary | ICD-10-CM | POA: Diagnosis not present

## 2022-11-02 DIAGNOSIS — Z23 Encounter for immunization: Secondary | ICD-10-CM | POA: Diagnosis not present

## 2022-11-02 DIAGNOSIS — F419 Anxiety disorder, unspecified: Secondary | ICD-10-CM

## 2022-11-02 DIAGNOSIS — E079 Disorder of thyroid, unspecified: Secondary | ICD-10-CM

## 2022-11-02 DIAGNOSIS — R030 Elevated blood-pressure reading, without diagnosis of hypertension: Secondary | ICD-10-CM

## 2022-11-02 DIAGNOSIS — E785 Hyperlipidemia, unspecified: Secondary | ICD-10-CM

## 2022-11-02 DIAGNOSIS — I10 Essential (primary) hypertension: Secondary | ICD-10-CM | POA: Diagnosis not present

## 2022-11-02 DIAGNOSIS — Z0000 Encounter for general adult medical examination without abnormal findings: Secondary | ICD-10-CM | POA: Diagnosis not present

## 2022-11-02 DIAGNOSIS — F17200 Nicotine dependence, unspecified, uncomplicated: Secondary | ICD-10-CM | POA: Diagnosis not present

## 2022-11-02 DIAGNOSIS — R600 Localized edema: Secondary | ICD-10-CM | POA: Diagnosis not present

## 2022-11-02 DIAGNOSIS — K219 Gastro-esophageal reflux disease without esophagitis: Secondary | ICD-10-CM | POA: Diagnosis not present

## 2022-11-02 DIAGNOSIS — N390 Urinary tract infection, site not specified: Secondary | ICD-10-CM

## 2022-11-02 LAB — LIPID PANEL
Cholesterol: 233 mg/dL — ABNORMAL HIGH (ref 0–200)
HDL: 101.4 mg/dL
LDL Cholesterol: 117 mg/dL — ABNORMAL HIGH (ref 0–99)
NonHDL: 131.18
Total CHOL/HDL Ratio: 2
Triglycerides: 72 mg/dL (ref 0.0–149.0)
VLDL: 14.4 mg/dL (ref 0.0–40.0)

## 2022-11-02 LAB — CBC WITH DIFFERENTIAL/PLATELET
Basophils Absolute: 0 K/uL (ref 0.0–0.1)
Basophils Relative: 0.7 % (ref 0.0–3.0)
Eosinophils Absolute: 0.1 K/uL (ref 0.0–0.7)
Eosinophils Relative: 1.2 % (ref 0.0–5.0)
HCT: 43.5 % (ref 36.0–46.0)
Hemoglobin: 14.3 g/dL (ref 12.0–15.0)
Lymphocytes Relative: 20.4 % (ref 12.0–46.0)
Lymphs Abs: 1.1 K/uL (ref 0.7–4.0)
MCHC: 32.9 g/dL (ref 30.0–36.0)
MCV: 95.7 fl (ref 78.0–100.0)
Monocytes Absolute: 0.4 K/uL (ref 0.1–1.0)
Monocytes Relative: 7 % (ref 3.0–12.0)
Neutro Abs: 3.8 K/uL (ref 1.4–7.7)
Neutrophils Relative %: 70.7 % (ref 43.0–77.0)
Platelets: 268 K/uL (ref 150.0–400.0)
RBC: 4.54 Mil/uL (ref 3.87–5.11)
RDW: 13.3 % (ref 11.5–15.5)
WBC: 5.4 K/uL (ref 4.0–10.5)

## 2022-11-02 LAB — COMPREHENSIVE METABOLIC PANEL
ALT: 17 U/L (ref 0–35)
AST: 22 U/L (ref 0–37)
Albumin: 3.9 g/dL (ref 3.5–5.2)
Alkaline Phosphatase: 61 U/L (ref 39–117)
BUN: 16 mg/dL (ref 6–23)
CO2: 31 mEq/L (ref 19–32)
Calcium: 9.5 mg/dL (ref 8.4–10.5)
Chloride: 95 mEq/L — ABNORMAL LOW (ref 96–112)
Creatinine, Ser: 0.69 mg/dL (ref 0.40–1.20)
GFR: 84.08 mL/min (ref >60.00)
Glucose, Bld: 76 mg/dL (ref 70–99)
Potassium: 4.4 mEq/L (ref 3.5–5.1)
Sodium: 134 mEq/L — ABNORMAL LOW (ref 135–145)
Total Bilirubin: 0.4 mg/dL (ref 0.2–1.2)
Total Protein: 7 g/dL (ref 6.0–8.3)

## 2022-11-02 LAB — URINALYSIS, ROUTINE W REFLEX MICROSCOPIC
Bilirubin Urine: NEGATIVE
Hgb urine dipstick: NEGATIVE
Ketones, ur: NEGATIVE
Nitrite: NEGATIVE
Specific Gravity, Urine: 1.015 (ref 1.000–1.030)
Total Protein, Urine: NEGATIVE
Urine Glucose: NEGATIVE
Urobilinogen, UA: 0.2 (ref 0.0–1.0)
pH: 6 (ref 5.0–8.0)

## 2022-11-02 LAB — TSH: TSH: 0.66 u[IU]/mL (ref 0.35–5.50)

## 2022-11-02 LAB — VITAMIN D 25 HYDROXY (VIT D DEFICIENCY, FRACTURES): VITD: 18.72 ng/mL — ABNORMAL LOW (ref 30.00–100.00)

## 2022-11-02 MED ORDER — VALSARTAN-HYDROCHLOROTHIAZIDE 80-12.5 MG PO TABS: 1.0000 | ORAL_TABLET | Freq: Every day | ORAL | 1 refills | Status: DC

## 2022-11-02 NOTE — Addendum Note (Signed)
Addended by: Westley Hummer B on: 11/02/2022 10:14 AM   Modules accepted: Orders
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
## 2022-11-04 ENCOUNTER — Other Ambulatory Visit: Payer: Self-pay | Admitting: Internal Medicine

## 2022-11-04 ENCOUNTER — Other Ambulatory Visit: Payer: Self-pay | Admitting: *Deleted

## 2022-11-04 DIAGNOSIS — E559 Vitamin D deficiency, unspecified: Secondary | ICD-10-CM

## 2022-11-04 DIAGNOSIS — N390 Urinary tract infection, site not specified: Secondary | ICD-10-CM

## 2022-11-04 DIAGNOSIS — R319 Hematuria, unspecified: Secondary | ICD-10-CM | POA: Diagnosis not present

## 2022-11-04 MED ORDER — VITAMIN D (ERGOCALCIFEROL) 1.25 MG (50000 UNIT) PO CAPS: 50000.0000 [IU] | ORAL_CAPSULE | ORAL | 0 refills | Status: AC

## 2022-11-05 LAB — URINE CULTURE
MICRO NUMBER:: 14632495
Result:: NO GROWTH
SPECIMEN QUALITY:: ADEQUATE

## 2022-11-18 ENCOUNTER — Telehealth: Payer: Self-pay | Admitting: Internal Medicine

## 2022-11-18 ENCOUNTER — Other Ambulatory Visit: Payer: Self-pay | Admitting: Adult Health

## 2022-11-18 DIAGNOSIS — F419 Anxiety disorder, unspecified: Secondary | ICD-10-CM

## 2022-11-18 MED ORDER — LORAZEPAM 0.5 MG PO TABS: 0.5000 mg | ORAL_TABLET | Freq: Two times a day (BID) | ORAL | 0 refills | Status: DC | PRN

## 2022-11-18 NOTE — Telephone Encounter (Signed)
Pt is calling and [facility] on [address] is closed and pt need a new rx LORazepam (ATIVAN) 0.5 MG tablet send to  [facility], [place] AT [place] Phone: [phone]  Fax: [phone]

## 2022-11-26 IMAGING — DX DG CHEST 2V
2 series · 2 of 2 positions shown · non-contrast
Comparison: Radiograph 11/16/2018, chest CT 02/14/2018

CLINICAL DATA: cough, COPD

EXAM:
CHEST - 2 VIEW

[chest pa]
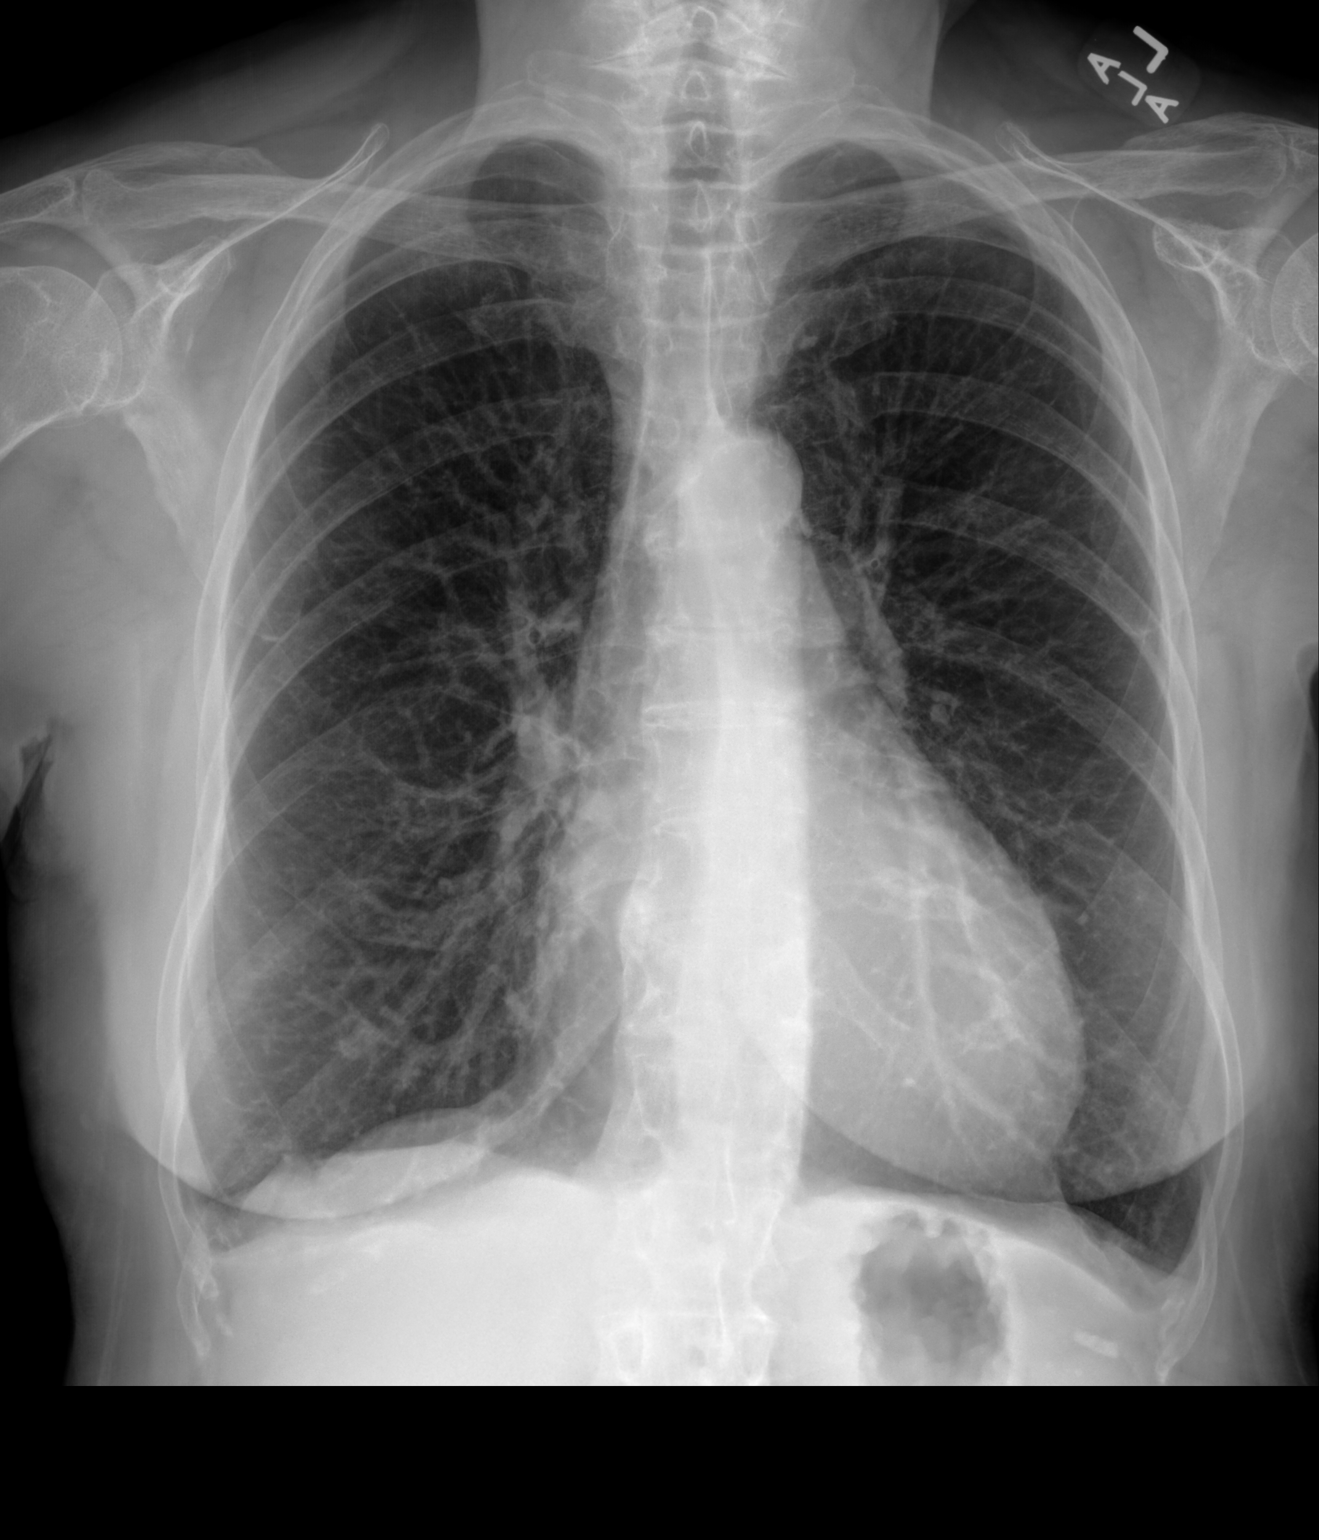

[chest lat]
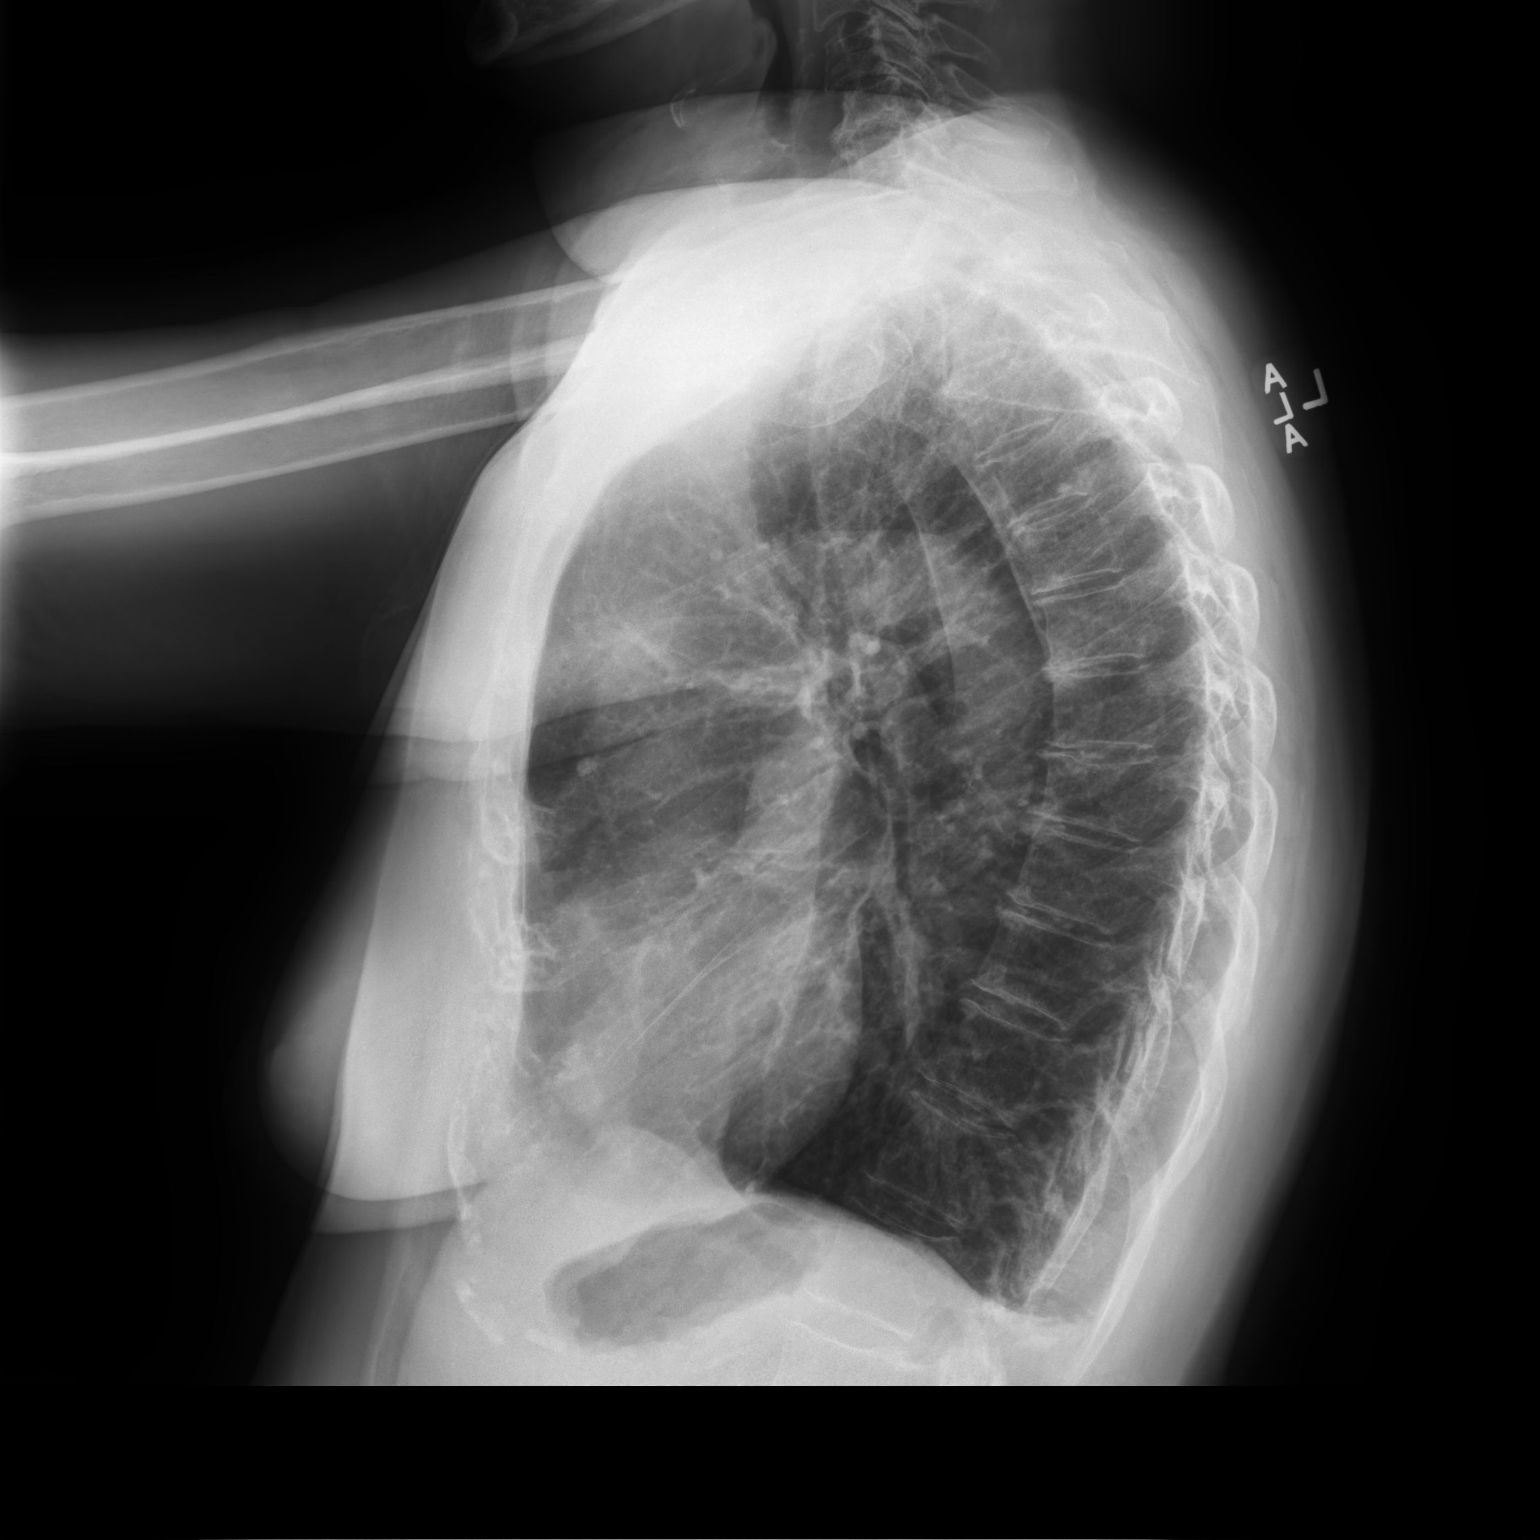

[2 of 2 positions shown; findings below may reference images not displayed]

FINDINGS: Unchanged cardiomediastinal silhouette. There is no focal airspace
disease. There is no large pleural effusion or visible pneumothorax.
Pulmonary hyperinflation. No acute osseous abnormality. Thoracic
spondylosis.
IMPRESSION: Pulmonary hyperinflation.  No focal airspace disease.

## 2022-12-06 ENCOUNTER — Telehealth: Payer: Self-pay | Admitting: Internal Medicine

## 2022-12-06 NOTE — Telephone Encounter (Signed)
Spoke with patient and a virtual visit was scheduled.

## 2022-12-06 NOTE — Telephone Encounter (Signed)
Pt called stating she tested positive Covid today and is having symptoms, headaches, congestion, fatigue. I offered to make her a virtual appointment, however she declined. She is requesting that the nurse reach out to her to discuss her symptoms further.

## 2022-12-07 ENCOUNTER — Telehealth (INDEPENDENT_AMBULATORY_CARE_PROVIDER_SITE_OTHER): Payer: Medicare Other | Admitting: Adult Health

## 2022-12-07 ENCOUNTER — Encounter: Payer: Self-pay | Admitting: Adult Health

## 2022-12-07 VITALS — Ht 62.0 in | Wt 160.0 lb

## 2022-12-07 DIAGNOSIS — U071 COVID-19: Secondary | ICD-10-CM

## 2022-12-07 MED ORDER — NIRMATRELVIR/RITONAVIR (PAXLOVID)TABLET: 3.0000 | ORAL_TABLET | Freq: Two times a day (BID) | ORAL | 0 refills | Status: AC
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
## 2022-12-13 ENCOUNTER — Other Ambulatory Visit: Payer: Self-pay | Admitting: Adult Health

## 2022-12-13 DIAGNOSIS — F419 Anxiety disorder, unspecified: Secondary | ICD-10-CM

## 2022-12-20 ENCOUNTER — Other Ambulatory Visit: Payer: Self-pay | Admitting: Internal Medicine

## 2022-12-20 DIAGNOSIS — F419 Anxiety disorder, unspecified: Secondary | ICD-10-CM

## 2022-12-20 MED ORDER — LORAZEPAM 0.5 MG PO TABS: 0.5000 mg | ORAL_TABLET | Freq: Two times a day (BID) | ORAL | 0 refills | Status: DC | PRN

## 2022-12-20 NOTE — Telephone Encounter (Signed)
Prescription Request  12/20/2022  LOV: 11/02/2022  What is the name of the medication or equipment? LORazepam (ATIVAN) 0.5 MG tablet   Have you contacted your pharmacy to request a refill? No   Which pharmacy would you like this sent to?  [id number] - [place], [place] - [address] AT [facility] OF [address] [place] [address] Phone: [phone] Fax: [phone]    Patient notified that their request is being sent to the clinical staff for review and that they should receive a response within 2 business days.   Please advise at Mobile [phone] (mobile)

## 2022-12-20 NOTE — Telephone Encounter (Signed)
Refill sent.

## 2022-12-21 ENCOUNTER — Ambulatory Visit (HOSPITAL_BASED_OUTPATIENT_CLINIC_OR_DEPARTMENT_OTHER)
Admission: RE | Admit: 2022-12-21 | Discharge: 2022-12-21 | Disposition: A | Discharge status: Home / Self Care | Payer: Medicare Other | Source: Ambulatory Visit | Attending: Internal Medicine | Admitting: Internal Medicine

## 2022-12-21 DIAGNOSIS — F17200 Nicotine dependence, unspecified, uncomplicated: Secondary | ICD-10-CM | POA: Diagnosis not present

## 2022-12-21 DIAGNOSIS — I700 Atherosclerosis of aorta: Secondary | ICD-10-CM | POA: Insufficient documentation

## 2022-12-21 DIAGNOSIS — I2510 Atherosclerotic heart disease of native coronary artery without angina pectoris: Secondary | ICD-10-CM | POA: Diagnosis not present

## 2022-12-21 DIAGNOSIS — Z87891 Personal history of nicotine dependence: Secondary | ICD-10-CM | POA: Diagnosis not present

## 2022-12-21 DIAGNOSIS — Z122 Encounter for screening for malignant neoplasm of respiratory organs: Secondary | ICD-10-CM | POA: Insufficient documentation

## 2022-12-21 DIAGNOSIS — J432 Centrilobular emphysema: Secondary | ICD-10-CM | POA: Diagnosis not present

## 2022-12-21 DIAGNOSIS — R911 Solitary pulmonary nodule: Secondary | ICD-10-CM | POA: Insufficient documentation

## 2022-12-28 ENCOUNTER — Encounter: Payer: Self-pay | Admitting: Internal Medicine

## 2022-12-28 ENCOUNTER — Ambulatory Visit: Payer: Medicare Other

## 2022-12-28 ENCOUNTER — Ambulatory Visit (INDEPENDENT_AMBULATORY_CARE_PROVIDER_SITE_OTHER): Payer: Medicare Other | Admitting: Internal Medicine

## 2022-12-28 VITALS — BP 135/72 | HR 75 | Temp 97.8°F | Wt 162.3 lb

## 2022-12-28 DIAGNOSIS — I10 Essential (primary) hypertension: Secondary | ICD-10-CM | POA: Diagnosis not present

## 2022-12-28 DIAGNOSIS — J449 Chronic obstructive pulmonary disease, unspecified: Secondary | ICD-10-CM

## 2022-12-28 MED ORDER — ALBUTEROL SULFATE HFA 108 (90 BASE) MCG/ACT IN AERS: 2.0000 | INHALATION_SPRAY | RESPIRATORY_TRACT | 2 refills | Status: DC | PRN
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
## 2023-01-04 ENCOUNTER — Other Ambulatory Visit: Payer: Self-pay | Admitting: Internal Medicine

## 2023-01-04 ENCOUNTER — Ambulatory Visit
Admission: RE | Admit: 2023-01-04 | Discharge: 2023-01-04 | Disposition: A | Discharge status: Home / Self Care | Payer: Medicare Other | Source: Ambulatory Visit | Attending: Internal Medicine | Admitting: Internal Medicine

## 2023-01-04 DIAGNOSIS — R921 Mammographic calcification found on diagnostic imaging of breast: Secondary | ICD-10-CM

## 2023-01-04 DIAGNOSIS — Z1231 Encounter for screening mammogram for malignant neoplasm of breast: Secondary | ICD-10-CM

## 2023-01-19 ENCOUNTER — Other Ambulatory Visit: Payer: Self-pay | Admitting: Internal Medicine

## 2023-01-19 DIAGNOSIS — F419 Anxiety disorder, unspecified: Secondary | ICD-10-CM

## 2023-02-06 ENCOUNTER — Other Ambulatory Visit: Payer: Self-pay | Admitting: Internal Medicine

## 2023-02-06 DIAGNOSIS — E559 Vitamin D deficiency, unspecified: Secondary | ICD-10-CM

## 2023-03-13 ENCOUNTER — Other Ambulatory Visit: Payer: Self-pay | Admitting: Internal Medicine

## 2023-03-13 DIAGNOSIS — F419 Anxiety disorder, unspecified: Secondary | ICD-10-CM

## 2023-03-15 DIAGNOSIS — H903 Sensorineural hearing loss, bilateral: Secondary | ICD-10-CM | POA: Diagnosis not present

## 2023-03-15 DIAGNOSIS — J342 Deviated nasal septum: Secondary | ICD-10-CM | POA: Diagnosis not present

## 2023-04-06 ENCOUNTER — Encounter: Payer: Self-pay | Admitting: Family Medicine

## 2023-04-06 ENCOUNTER — Ambulatory Visit (INDEPENDENT_AMBULATORY_CARE_PROVIDER_SITE_OTHER): Payer: Medicare Other | Admitting: Family Medicine

## 2023-04-06 VITALS — BP 136/78 | HR 100 | Temp 98.2°F | Ht 62.0 in | Wt 164.4 lb

## 2023-04-06 DIAGNOSIS — S39011A Strain of muscle, fascia and tendon of abdomen, initial encounter: Secondary | ICD-10-CM | POA: Diagnosis not present

## 2023-04-06 MED ORDER — DICLOFENAC SODIUM 75 MG PO TBEC: 75.0000 mg | DELAYED_RELEASE_TABLET | Freq: Two times a day (BID) | ORAL | 0 refills | Status: DC | PRN

## 2023-04-06 NOTE — Progress Notes (Signed)
   Subjective:    Patient ID: Natalie Smith, female    DOB: Dec 21, 1945, 77 y.o.   MRN: [id number]  HPI Here for 6 weeks of sharp pains in the right hip area. This started suddenly one day when she was working in her garden. No back pain and no leg pain. She is using Tylenol and Icy Hot with little relief. She has no pain with standing still or walking or sitting. Most of her pain comes with standing up from a sitting position or with twisting her trunk.    Review of Systems  Constitutional: Negative.   Respiratory: Negative.    Cardiovascular: Negative.   Musculoskeletal:  Positive for arthralgias. Negative for back pain.       Objective:   Physical Exam Constitutional:      General: She is not in acute distress.    Appearance: Normal appearance.  Cardiovascular:     Rate and Rhythm: Normal rate and regular rhythm.     Pulses: Normal pulses.     Heart sounds: Normal heart sounds.  Pulmonary:     Effort: Pulmonary effort is normal.     Breath sounds: Normal breath sounds.  Musculoskeletal:     Comments: She is tender in a band around the right flank from the right lateral flank to the right anterior hip area. Both hips have full ROM, and moving the legs does not cause pain. No hernias. No tenderness over the greater tronchanter.   Neurological:     Mental Status: She is alert.           Assessment & Plan:  She seems to have a muscle strain where the right lower abdominal muscles attach to the top of the pelvis. This should resolve over time. The hips joints are fine. She can use Diclofenac 75 mg BID as needed, and she can apply ice packs. Recheck as needed.  Gershon Crane, MD

## 2023-04-12 ENCOUNTER — Other Ambulatory Visit: Payer: Self-pay | Admitting: Internal Medicine

## 2023-05-08 ENCOUNTER — Other Ambulatory Visit: Payer: Self-pay | Admitting: Internal Medicine

## 2023-05-08 DIAGNOSIS — F419 Anxiety disorder, unspecified: Secondary | ICD-10-CM

## 2023-06-05 ENCOUNTER — Other Ambulatory Visit: Payer: Self-pay | Admitting: Internal Medicine

## 2023-06-05 DIAGNOSIS — F419 Anxiety disorder, unspecified: Secondary | ICD-10-CM

## 2023-06-28 ENCOUNTER — Ambulatory Visit: Payer: Medicare Other | Admitting: Internal Medicine

## 2023-06-29 ENCOUNTER — Ambulatory Visit: Payer: Medicare Other | Admitting: Internal Medicine

## 2023-06-30 ENCOUNTER — Ambulatory Visit (INDEPENDENT_AMBULATORY_CARE_PROVIDER_SITE_OTHER): Payer: Medicare Other | Admitting: Internal Medicine

## 2023-06-30 ENCOUNTER — Encounter: Payer: Self-pay | Admitting: Internal Medicine

## 2023-06-30 ENCOUNTER — Other Ambulatory Visit: Payer: Self-pay | Admitting: Internal Medicine

## 2023-06-30 VITALS — BP 120/70 | HR 82 | Temp 98.1°F | Wt 164.8 lb

## 2023-06-30 DIAGNOSIS — Z23 Encounter for immunization: Secondary | ICD-10-CM | POA: Diagnosis not present

## 2023-06-30 DIAGNOSIS — E785 Hyperlipidemia, unspecified: Secondary | ICD-10-CM

## 2023-06-30 DIAGNOSIS — J449 Chronic obstructive pulmonary disease, unspecified: Secondary | ICD-10-CM
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
## 2023-07-03 ENCOUNTER — Other Ambulatory Visit: Payer: Self-pay | Admitting: Family Medicine

## 2023-07-03 DIAGNOSIS — F419 Anxiety disorder, unspecified: Secondary | ICD-10-CM

## 2023-07-04 NOTE — Telephone Encounter (Signed)
Pt LOV was 06/30/23 Last refill was done on 06/07/23 Please advise

## 2023-07-05 DIAGNOSIS — E785 Hyperlipidemia, unspecified: Secondary | ICD-10-CM | POA: Diagnosis not present

## 2023-07-05 DIAGNOSIS — Z23 Encounter for immunization: Secondary | ICD-10-CM | POA: Diagnosis not present

## 2023-07-05 NOTE — Addendum Note (Signed)
Addended by: Kern Reap B on: 07/05/2023 12:03 PM   Modules accepted: Orders

## 2023-08-03 ENCOUNTER — Other Ambulatory Visit: Payer: Self-pay | Admitting: Internal Medicine

## 2023-08-03 DIAGNOSIS — F419 Anxiety disorder, unspecified: Secondary | ICD-10-CM

## 2023-08-11 ENCOUNTER — Other Ambulatory Visit: Payer: Self-pay | Admitting: Internal Medicine

## 2023-08-11 NOTE — Telephone Encounter (Signed)
Not on current med list.

## 2023-08-27 ENCOUNTER — Other Ambulatory Visit: Payer: Self-pay | Admitting: Internal Medicine

## 2023-09-07 ENCOUNTER — Other Ambulatory Visit: Payer: Self-pay | Admitting: Internal Medicine

## 2023-09-07 DIAGNOSIS — F419 Anxiety disorder, unspecified: Secondary | ICD-10-CM

## 2023-10-11 ENCOUNTER — Other Ambulatory Visit: Payer: Self-pay | Admitting: Internal Medicine

## 2023-10-11 DIAGNOSIS — F419 Anxiety disorder, unspecified: Secondary | ICD-10-CM

## 2023-11-07 ENCOUNTER — Other Ambulatory Visit: Payer: Self-pay | Admitting: Internal Medicine

## 2023-11-07 DIAGNOSIS — F419 Anxiety disorder, unspecified: Secondary | ICD-10-CM

## 2023-11-08 ENCOUNTER — Encounter: Payer: Medicare Other | Admitting: Family Medicine

## 2023-11-08 NOTE — Progress Notes (Signed)
Error

## 2023-11-15 ENCOUNTER — Ambulatory Visit (INDEPENDENT_AMBULATORY_CARE_PROVIDER_SITE_OTHER): Admitting: Family Medicine

## 2023-11-15 DIAGNOSIS — Z0000 Encounter for general adult medical examination without abnormal findings: Secondary | ICD-10-CM | POA: Diagnosis not present
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
## 2023-11-15 NOTE — Patient Instructions (Signed)
 I really enjoyed getting to talk with you today! I am available on Tuesdays and Thursdays for virtual visits if you have any questions or concerns, or if I can be of any further assistance.   CHECKLIST FROM ANNUAL WELLNESS VISIT:  -Follow up (please call to schedule if not scheduled after visit):   -yearly for annual wellness visit with primary care office  Here is a list of your preventive care/health maintenance measures and the plan for each if any are due:  PLAN For any measures below that may be due:  -can get vaccines at the pharmacy if you wish, please let us know if you do so that we can update your records -can get lung cancer screening with pulmonology - please discuss with them -can get bone density when you wish - please let us know and we can order  Health Maintenance  Topic Date Due   Zoster Vaccines- Shingrix (1 of 2) 01/13/1996   COVID-19 Vaccine (5 - 2024-25 season) 05/08/2023   Lung Cancer Screening  12/21/2023   Medicare Annual Wellness (AWV)  11/14/2024   DTaP/Tdap/Td (2 - Tdap) 03/03/2026   Pneumonia Vaccine 78+ Years old  Completed   INFLUENZA VACCINE  Completed   DEXA SCAN  Completed   Hepatitis C Screening  Completed   HPV VACCINES  Aged Out   Colonoscopy  Discontinued    -See a dentist at least yearly  -Get your eyes checked and then per your eye specialist's recommendations  -Other issues addressed today:  Alcohol: please limit to no more than one drink per 24 hour period  -I have included below further information regarding a healthy whole foods based diet, physical activity guidelines for adults, stress management and opportunities for social connections. I hope you find this information useful.    -----------------------------------------------------------------------------------------------------------------------------------------------------------------------------------------------------------------------------------------------------------    NUTRITION: -eat real food: lots of colorful vegetables (half the plate) and fruits -5-7 servings of vegetables and fruits per day (fresh or steamed is best), exp. 2 servings of vegetables with lunch and dinner and 2 servings of fruit per day. Berries and greens such as kale and collards are great choices.  -consume on a regular basis:  fresh fruits, fresh veggies, fish, nuts, seeds, healthy oils (such as olive oil, avocado oil), whole grains (make sure for bread/pasta/crackers/etc., that the first ingredient on label contains the word "whole"), legumes. -can eat small amounts of dairy and lean meat (no larger than the palm of your hand), but avoid processed meats such as ham, bacon, lunch meat, etc. -drink water -try to avoid fast food and pre-packaged foods, processed meat, ultra processed foods/beverages (donuts, candy, etc.) -most experts advise limiting sodium to < 2300mg  per day, should limit further is any chronic conditions such as high blood pressure, heart disease, diabetes, etc. The American Heart Association advised that < 1500mg  is is ideal -try to avoid foods/beverages that contain any ingredients with names you do not recognize  -try to avoid foods/beverages  with added sugar or sweeteners/sweets  -try to avoid sweet drinks (including diet drinks): soda, juice, Gatorade, sweet tea, power drinks, diet drinks -try to avoid white rice, white bread, pasta (unless whole grain)  EXERCISE GUIDELINES FOR ADULTS: -if you wish to increase your physical activity, do so gradually and with the approval of your doctor -STOP and seek medical care immediately if you have any chest pain, chest discomfort or trouble breathing when starting or  increasing exercise  -move and stretch your body, legs, feet and arms when  sitting for long periods -Physical activity guidelines for optimal health in adults: -get at least 150 minutes per week of moderate exercise (can talk, but not sing); this is about 20-30 minutes of sustained activity 5-7 days per week or two 10-15 minute episodes of sustained activity 5-7 days per week -do some muscle building/resistance training/strength training at least 2 days per week  -balance exercises 3+ days per week:   Stand somewhere where you have something sturdy to hold onto if you lose balance    1) lift up on toes, then back down, start with 5x per day and work up to 20x   2) stand and lift one leg straight out to the side so that foot is a few inches of the floor, start with 5x each side and work up to 20x each side   3) stand on one foot, start with 5 seconds each side and work up to 20 seconds on each side  If you need ideas or help with getting more active:  -Silver sneakers https://tools.silversneakers.com  -Walk with a Doc: http://www.duncan-williams.com/  -try to include resistance (weight lifting/strength building) and balance exercises twice per week: or the following link for ideas: http://castillo-powell.com/  BuyDucts.dk  STRESS MANAGEMENT: -can try meditating, or just sitting quietly with deep breathing while intentionally relaxing all parts of your body for 5 minutes daily -if you need further help with stress, anxiety or depression please follow up with your primary doctor or contact the wonderful folks at [facility]: [phone]  SOCIAL CONNECTIONS: -options in [place] if you wish to engage in more social and exercise related activities:  -Silver sneakers https://tools.silversneakers.com  -Walk with a Doc: http://www.duncan-williams.com/  -Check out the [facility] Active Adults 50+  section on the [place] [employer] (hiking clubs, book clubs, cards and games, chess, exercise classes, aquatic classes and much more) - see the website for details: https://www.[place].gov/departments/parks-recreation/active-adults50  -YouTube has lots of exercise videos for different ages and abilities as well  -[facility] (a variety of indoor and outdoor inperson activities for adults). [phone]. [address]  -Virtual Online Classes (a variety of topics): see seniorplanet.org or call [phone]  -consider volunteering at a school, hospice center, church, senior center or elsewhere          ADVANCED HEALTHCARE DIRECTIVES:  [place] Advanced Directives assistance:   ExpressWeek.com.cy  Everyone should have advanced health care directives in place. This is so that you get the care you want, should you ever be in a situation where you are unable to make your own medical decisions.   From the [place] Advanced Directive Website: "Advance Health Care Directives are legal documents in which you give written instructions about your health care if, in the future, you cannot speak for yourself.   A health care power of attorney allows you to name a person you trust to make your health care decisions if you cannot make them yourself. A declaration of a desire for a natural death (or living will) is document, which states that you desire not to have your life prolonged by extraordinary measures if you have a terminal or incurable illness or if you are in a vegetative state. An advance instruction for mental health treatment makes a declaration of instructions, information and preferences regarding your mental health treatment. It also states that you are aware that the advance instruction authorizes a mental health treatment provider to act according to your wishes. It may also outline your consent or  refusal  of mental health treatment. A declaration of an anatomical gift allows anyone over the age of 23 to make a gift by will, organ donor card or other document."   Please see the following website or an elder law attorney for forms, FAQs and for completion of advanced directives: [place] Health Care Directives Advance Health Care Directives (http://guzman.com/)  Or copy and paste the following to your web browser: PoshChat.[email or web address]

## 2023-11-24 ENCOUNTER — Encounter: Payer: Self-pay | Admitting: Internal Medicine

## 2023-11-24 ENCOUNTER — Ambulatory Visit (INDEPENDENT_AMBULATORY_CARE_PROVIDER_SITE_OTHER): Payer: Medicare Other | Admitting: Internal Medicine

## 2023-11-24 VITALS — BP 118/70 | HR 63 | Temp 97.6°F | Ht 63.0 in | Wt 164.6 lb

## 2023-11-24 DIAGNOSIS — J449 Chronic obstructive pulmonary disease, unspecified: Secondary | ICD-10-CM

## 2023-11-24 MED ORDER — BREZTRI AEROSPHERE 160-9-4.8 MCG/ACT IN AERO: 2.0000 | INHALATION_SPRAY | Freq: Two times a day (BID) | RESPIRATORY_TRACT | Status: DC

## 2023-11-24 NOTE — Patient Instructions (Signed)
 Plan A = Automatic = Always=    Breztri Take 2 puffs first thing in am and then another 2 puffs about 12 hours later.    Work on inhaler technique:  relax and gently blow all the way out then take a nice smooth full deep breath back in, triggering the inhaler at same time you start breathing in.  Hold breath in for at least  5 seconds if you can. Blow out breztri  thru nose. Rinse and gargle with water when done.  If mouth or throat bother you at all,  try brushing teeth/gums/tongue with arm and hammer toothpaste/ make a slurry and gargle and spit out.   >>>  Remember how golfers warm up by taking practice swings - do this with an empty inhaler   Plan B = Backup (to supplement plan A, not to replace it) Only use your bonchaid as needed      Make sure you check your oxygen saturation at your highest level of activity(NOT after you stop)  to be sure it stays over 90% and keep track of it at least once a week, more often if breathing getting worse, and let me know if losing ground. (Collect the dots to connect the dots approach)     Please schedule a follow up office visit in 8  weeks, call sooner if needed

## 2023-11-24 NOTE — Progress Notes (Unsigned)
 Natalie Smith, female    DOB: [birth year]   MRN: [id number]   Brief patient profile:  47 yowf  quit smoking 2022  [place] pt self referred to pulmonary clinic 11/24/2023   for copd eval       History of Present Illness  11/24/2023  Pulmonary/ 1st office eval/Aubry Tucholski  Chief Complaint  Patient presents with   Consult  Dyspnea:   likes to walk dog mostly flat surface slow pace x 30 min loop stops at least sev times to catch her breath no better on advair which "makes her sick on her stomach" Cough: none  Sleep: bed flat with one  pillow  wakes up at usual hour  SABA use:not using any / prefers bronchaid pills which Dr Everardo All cautioned her against using  02 WUJ:[id number]   No obvious day to day or daytime pattern/variability or assoc excess/ purulent sputum or mucus plugs or hemoptysis or cp or chest tightness, subjective wheeze or overt sinus or hb symptoms.    Also denies any obvious fluctuation of symptoms with weather or environmental changes or other aggravating or alleviating factors except as outlined above   No unusual exposure hx or h/o childhood pna/ asthma or knowledge of premature birth.  Current Allergies, Complete Past Medical History, Past Surgical History, Family History, and Social History were reviewed in [employer] record.  ROS  The following are not active complaints unless bolded Hoarseness, sore throat, dysphagia, dental problems, itching, sneezing,  nasal congestion or discharge of excess mucus or purulent secretions, ear ache,   fever, chills, sweats, unintended wt loss or wt gain, classically pleuritic or exertional cp,  orthopnea pnd or arm/hand swelling  or leg swelling, presyncope, palpitations, abdominal pain, anorexia, nausea, vomiting, diarrhea  or change in bowel habits or change in bladder habits, change in stools or change in urine, dysuria, hematuria,  rash, arthralgias, visual complaints, headache, numbness, weakness or ataxia or problems  with walking or coordination,  change in mood or  memory.             Outpatient Medications Prior to Visit - - NOTE:   Unable to verify as accurately reflecting what pt takes    Medication Sig Dispense Refill   acetaminophen (TYLENOL) 325 MG tablet Take 325 mg by mouth as needed for moderate pain.     albuterol (VENTOLIN HFA) 108 (90 Base) MCG/ACT inhaler Inhale 2 puffs into the lungs every 4 (four) hours as needed for wheezing or shortness of breath. 8 g 2   LORazepam (ATIVAN) 0.5 MG tablet TAKE 1 TABLET(0.5 MG) BY MOUTH TWICE DAILY AS NEEDED FOR ANXIETY 45 tablet 1   Multiple Vitamins-Minerals (MULTIVITAMIN WITH MINERALS) tablet Take 1 tablet by mouth daily.     valsartan-hydrochlorothiazide (DIOVAN-HCT) 80-12.5 MG tablet Take 1 tablet by mouth daily. 90 tablet 1   diclofenac (VOLTAREN) 75 MG EC tablet Take 1 tablet (75 mg total) by mouth 2 (two) times daily as needed for mild pain. 60 tablet 0   fluticasone-salmeterol (ADVAIR) 250-50 MCG/ACT AEPB INHALE 1 PUFF INTO THE LUNGS IN THE MORNING AND AT BEDTIME (Patient not taking: Reported on 11/24/2023) 60 each 0   No facility-administered medications prior to visit.    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   COPD (chronic obstructive pulmonary disease) (HCC)  EPIGASTRIC PAIN 02/19/2010   Gallstones 07/22/2011   GERD (gastroesophageal reflux disease) 08/09/2013   Headache(784.0) 04/21/2007   Hyperlipidemia    HYPERLIPIDEMIA 10/21/2008   LEG PAIN, LEFT 08/20/2009   RASH-NONVESICULAR 06/26/2007   THYROID DISORDER 04/21/2007   Thyroid nodule 1978   benign   Vitamin D deficiency 11/22/2011      Objective:     BP 118/70 (BP Location: Left Arm, Cuff Size: Large)   Pulse 63   Temp 97.6 F (36.4 C) (Temporal)   Ht 5\' 3"  (1.6 m)   Wt 164 lb 9.6 oz  (74.7 kg)   SpO2 90%   BMI 29.16 kg/m   SpO2: 90 %  RA   Amb wf nad   HEENT : Oropharynx  clear       NECK :  without  apparent JVD/ palpable Nodes/TM    LUNGS: no acc muscle use,  Mild barrel  contour chest wall with end exp  wheeze and  without cough on insp or exp maneuvers  and mild  Hyperresonant  to  percussion bilaterally     CV:  RRR  no s3 or murmur or increase in P2, and no edema   ABD:  soft and nontender with pos end  insp Hoover's  in the supine position.  No bruits or organomegaly appreciated   MS:  Nl gait/ ext warm without deformities Or obvious joint restrictions  calf tenderness, cyanosis or clubbing     SKIN: warm and dry without lesions    NEURO:  alert, approp, nl sensorium with  no motor or cerebellar deficits apparent.          I personally reviewed images and agree with radiology impression as follows:   LDSCT:12/21/22 : Small pulmonary nodule in the posterior aspect of the right upper lobe (axial image 102), with a volume derived mean diameter of 4.8 mm. Multiple other calcified granulomas are also incidentally noted. No acute consolidative airspace disease. No pleural effusions. Mild diffuse bronchial wall thickening with mild centrilobular and paraseptal emphysema.    Assessment   COPD   GOLD 3 Stopped smoking 2022 - Eos 0.1  11/02/22  - 11/24/2023   Walked on RA  x  2  lap(s) =  approx 500 ft  @ avg pace, stopped due to end of study  with lowest 02 sats 94%  - PFT's  05/02/27  FEV1 1.00 (48 % ) ratio 0.60  p 15 % improvement from saba p 0 prior to study with DLCO  17.8 (82%)   and FV curve mildly concave   - 11/24/2023  After extensive coaching inhaler device,  effectiveness =    60% with hfa (short Ti) > breztri trial    Group D (now reclassified as E) in terms of symptom/risk and laba/lama/ICS  therefore appropriate rx at this point >>>  breztri trial with goal of eliminating need for bronchaid tablets as prns.  Each maintenance medication  was reviewed in detail including emphasizing most importantly the difference between maintenance and prns and under what circumstances the prns are to be triggered using an action plan format where appropriate.  Total time for H and P, chart review, counseling, reviewing hfa  device(s) , directly observing portions of ambulatory 02 saturation study/ and generating customized AVS unique to this office visit / same day charting = 40 min with pt new to me  Sandrea Hughs, MD 11/24/2023

## 2023-11-25 NOTE — Assessment & Plan Note (Addendum)
 Stopped smoking 2022 - Eos 0.1  11/02/22  - 11/24/2023   Walked on RA  x  2  lap(s) =  approx 500 ft  @ avg pace, stopped due to end of study  with lowest 02 sats 94%  - PFT's  05/02/27  FEV1 1.00 (48 % ) ratio 0.60  p 15 % improvement from saba p 0 prior to study with DLCO  17.8 (82%)   and FV curve mildly concave   - 11/24/2023  After extensive coaching inhaler device,  effectiveness =    60% with hfa (short Ti) > breztri trial    Group D (now reclassified as E) in terms of symptom/risk and laba/lama/ICS  therefore appropriate rx at this point >>>  breztri trial with goal of eliminating need for bronchaid tablets as prns.  Each maintenance medication was reviewed in detail including emphasizing most importantly the difference between maintenance and prns and under what circumstances the prns are to be triggered using an action plan format where appropriate.  Total time for H and P, chart review, counseling, reviewing hfa  device(s) , directly observing portions of ambulatory 02 saturation study/ and generating customized AVS unique to this office visit / same day charting = 40 min with pt new to me

## 2023-12-12 ENCOUNTER — Telehealth: Payer: Self-pay

## 2023-12-12 ENCOUNTER — Other Ambulatory Visit: Payer: Self-pay | Admitting: Internal Medicine

## 2023-12-12 DIAGNOSIS — I10 Essential (primary) hypertension: Secondary | ICD-10-CM

## 2023-12-12 MED ORDER — BREZTRI AEROSPHERE 160-9-4.8 MCG/ACT IN AERO: 2.0000 | INHALATION_SPRAY | Freq: Two times a day (BID) | RESPIRATORY_TRACT | 3 refills | Status: DC

## 2023-12-12 NOTE — Telephone Encounter (Signed)
 Ok to pick up another sample and send in rx as well

## 2023-12-12 NOTE — Telephone Encounter (Signed)
 Error

## 2023-12-12 NOTE — Telephone Encounter (Signed)
 Copied from CRM [id number]. Topic: Clinical - Prescription Issue >> Dec 12, 2023  7:44 AM Natalie Smith wrote: Reason for CRM:   Patient is calling in regards to her budeson-glycopyrrolate-formoterol (BREZTRI AEROSPHERE) 160-9-4.8 MCG/ACT AERO.  She was provided a sample of the inhaler at her last office visit. She has been using the inhaler, closely following the given instructions and notes the inhaler is now empty. She reports she has not overused it and thinks it may have been a defective inhaler. She is asking if she could receive another inhaler from the office. CB# [phone]  ATC x1. LDVMTCB

## 2023-12-12 NOTE — Telephone Encounter (Signed)
 Patient was not aware samples were not full size, explained 1 sample should last 1 week. She voiced understanding. Rx sent to [facility] per patient request. Patient will come pick up sample in the next few days. Nothing further needed at this time.

## 2023-12-12 NOTE — Telephone Encounter (Signed)
 Pt returning call.  Pt states she was given 2 breztri samples and opened her 2nd sample last week and this morning it says it is empty. Pt thinks it may be a defect. Pt is using correct inhaler technique and claims inhaler is working great for her. Please advise Dr. Sherene Sires if you would script sent or if samples need to be provided to pt.

## 2023-12-13 ENCOUNTER — Telehealth: Payer: Self-pay

## 2023-12-13 MED ORDER — BREZTRI AEROSPHERE 160-9-4.8 MCG/ACT IN AERO: INHALATION_SPRAY | RESPIRATORY_TRACT | Status: DC

## 2023-12-13 NOTE — Telephone Encounter (Signed)
 Breztri samples given to pt

## 2023-12-22 ENCOUNTER — Other Ambulatory Visit (HOSPITAL_COMMUNITY): Payer: Self-pay

## 2023-12-22 ENCOUNTER — Telehealth: Payer: Self-pay

## 2023-12-22 NOTE — Telephone Encounter (Signed)
 Copied from CRM [id number]. Topic: Clinical - Prescription Issue >> Dec 22, 2023 10:29 AM Ilene Malling wrote: Reason for CRM: Patient [id number] states budeson-glycopyrrolate-formoterol (BREZTRI AEROSPHERE) 160-9-4.8 MCG/ACT AERO inhaler is too expensive and cannot afford it, would like another medication sent to [facility] [place], [place] - [address] [place] [address] Phone: [phone] Fax: [phone]. Please advise and call back.  ATC x1 LVM for patient to call our office back regarding prior message .

## 2023-12-22 NOTE — Telephone Encounter (Signed)
 Pharmacy Patient Advocate Encounter   Received notification from Pt Calls Messages that prior authorization for [employer] is required/requested.   Insurance verification completed.   The patient is insured through [facility] .   Per test claim: The current 30 day co-pay is, $254.79.  No PA needed at this time. This test claim was processed through [facility]- copay amounts may vary at other pharmacies due to pharmacy/plan contracts, or as the patient moves through the different stages of their insurance plan.

## 2023-12-27 ENCOUNTER — Other Ambulatory Visit: Payer: Self-pay | Admitting: Internal Medicine

## 2023-12-27 DIAGNOSIS — F419 Anxiety disorder, unspecified: Secondary | ICD-10-CM

## 2023-12-28 ENCOUNTER — Telehealth: Payer: Self-pay

## 2023-12-28 MED ORDER — BREZTRI AEROSPHERE 160-9-4.8 MCG/ACT IN AERO: 2.0000 | INHALATION_SPRAY | Freq: Two times a day (BID) | RESPIRATORY_TRACT | 11 refills | Status: DC

## 2023-12-28 NOTE — Telephone Encounter (Signed)
 Copied from CRM [id number]. Topic: Clinical - Prescription Issue >> Dec 27, 2023  8:11 AM Hilton Lucky wrote: Reason for CRM: Patient is returning call for [facility]. Patient would like Breztri  called in ot [facility] and would like to give it a try.  ATC x1 LVM for patient to let her know I have sent in a script for Breztri  to patient's pharmacy.

## 2023-12-29 ENCOUNTER — Encounter: Payer: Self-pay | Admitting: Internal Medicine

## 2023-12-29 ENCOUNTER — Ambulatory Visit: Payer: Medicare Other | Admitting: Internal Medicine

## 2023-12-29 VITALS — BP 162/78 | HR 75 | Temp 98.3°F | Resp 16 | Ht 63.0 in | Wt 165.0 lb

## 2023-12-29 DIAGNOSIS — F32A Depression, unspecified: Secondary | ICD-10-CM

## 2023-12-29 DIAGNOSIS — E079 Disorder of thyroid, unspecified: Secondary | ICD-10-CM

## 2023-12-29 DIAGNOSIS — Z780 Asymptomatic menopausal state: Secondary | ICD-10-CM

## 2023-12-29 DIAGNOSIS — E785 Hyperlipidemia, unspecified: Secondary | ICD-10-CM | POA: Diagnosis not present

## 2023-12-29 DIAGNOSIS — E559 Vitamin D deficiency, unspecified: Secondary | ICD-10-CM

## 2023-12-29 DIAGNOSIS — Z122 Encounter for screening for malignant neoplasm of respiratory organs: Secondary | ICD-10-CM

## 2023-12-29 DIAGNOSIS — Z0000 Encounter for general adult medical examination without abnormal findings: Secondary | ICD-10-CM

## 2023-12-29 DIAGNOSIS — I10 Essential (primary) hypertension: Secondary | ICD-10-CM | POA: Diagnosis not present

## 2023-12-29 DIAGNOSIS — F419 Anxiety disorder, unspecified: Secondary | ICD-10-CM | POA: Diagnosis not present

## 2023-12-29 LAB — CBC WITH DIFFERENTIAL/PLATELET
Basophils Absolute: 0 10*3/uL (ref 0.0–0.1)
Basophils Relative: 0.7 % (ref 0.0–3.0)
Eosinophils Absolute: 0.1 10*3/uL (ref 0.0–0.7)
Eosinophils Relative: 1.5 % (ref 0.0–5.0)
HCT: 44.4 % (ref 36.0–46.0)
Hemoglobin: 14.5 g/dL (ref 12.0–15.0)
Lymphocytes Relative: 21.9 % (ref 12.0–46.0)
Lymphs Abs: 1 10*3/uL (ref 0.7–4.0)
MCHC: 32.7 g/dL (ref 30.0–36.0)
MCV: 97.4 fl (ref 78.0–100.0)
Monocytes Absolute: 0.3 10*3/uL (ref 0.1–1.0)
Monocytes Relative: 7 % (ref 3.0–12.0)
Neutro Abs: 3 10*3/uL (ref 1.4–7.7)
Neutrophils Relative %: 68.9 % (ref 43.0–77.0)
Platelets: 237 10*3/uL (ref 150.0–400.0)
RBC: 4.55 Mil/uL (ref 3.87–5.11)
RDW: 13.3 % (ref 11.5–15.5)
WBC: 4.4 10*3/uL (ref 4.0–10.5)

## 2023-12-29 LAB — LIPID PANEL
Cholesterol: 240 mg/dL — ABNORMAL HIGH (ref 0–200)
HDL: 108.1 mg/dL (ref >39.00)
LDL Cholesterol: 119 mg/dL — ABNORMAL HIGH (ref 0–99)
NonHDL: 132.07
Total CHOL/HDL Ratio: 2
Triglycerides: 63 mg/dL (ref 0.0–149.0)
VLDL: 12.6 mg/dL (ref 0.0–40.0)

## 2023-12-29 LAB — COMPREHENSIVE METABOLIC PANEL WITH GFR
ALT: 14 U/L (ref 0–35)
AST: 16 U/L (ref 0–37)
Albumin: 3.9 g/dL (ref 3.5–5.2)
Alkaline Phosphatase: 57 U/L (ref 39–117)
BUN: 12 mg/dL (ref 6–23)
CO2: 32 meq/L (ref 19–32)
Calcium: 9.1 mg/dL (ref 8.4–10.5)
Chloride: 95 meq/L — ABNORMAL LOW (ref 96–112)
Creatinine, Ser: 0.58 mg/dL (ref 0.40–1.20)
GFR: 86.96 mL/min (ref >60.00)
Glucose, Bld: 89 mg/dL (ref 70–99)
Potassium: 4.1 meq/L (ref 3.5–5.1)
Sodium: 133 meq/L — ABNORMAL LOW (ref 135–145)
Total Bilirubin: 0.8 mg/dL (ref 0.2–1.2)
Total Protein: 6.6 g/dL (ref 6.0–8.3)

## 2023-12-29 LAB — TSH: TSH: 0.52 u[IU]/mL (ref 0.35–5.50)

## 2023-12-29 LAB — VITAMIN B12: Vitamin B-12: 392 pg/mL (ref 211–911)

## 2023-12-29 LAB — VITAMIN D 25 HYDROXY (VIT D DEFICIENCY, FRACTURES): VITD: 23.47 ng/mL — ABNORMAL LOW (ref 30.00–100.00)

## 2023-12-29 MED ORDER — AMLODIPINE BESYLATE 5 MG PO TABS: 5.0000 mg | ORAL_TABLET | Freq: Every day | ORAL | 1 refills | Status: DC
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
## 2024-01-02 ENCOUNTER — Encounter: Payer: Self-pay | Admitting: Internal Medicine

## 2024-01-02 ENCOUNTER — Other Ambulatory Visit: Payer: Self-pay | Admitting: Internal Medicine

## 2024-01-02 DIAGNOSIS — E559 Vitamin D deficiency, unspecified: Secondary | ICD-10-CM

## 2024-01-02 MED ORDER — VITAMIN D (ERGOCALCIFEROL) 1.25 MG (50000 UNIT) PO CAPS: 50000.0000 [IU] | ORAL_CAPSULE | ORAL | 0 refills | Status: AC

## 2024-01-15 NOTE — Progress Notes (Unsigned)
 Natalie Smith, female    DOB: Mar 11, 1946   MRN: [id number]   Brief patient profile:  29  yowf  quit smoking 2022  [facility] pt self referred to pulmonary clinic 11/24/2023   for copd eval       History of Present Illness  11/24/2023  Pulmonary/ 1st office eval/Cozette Braggs  Chief Complaint  Patient presents with   Consult  Dyspnea:   likes to walk dog mostly flat surface slow pace x 30 min loop stops at least sev times to catch her breath no better on advair which "makes her sick on her stomach" Cough: none  Sleep: bed flat with one  pillow  wakes up at usual hour  SABA use:not using any / prefers bronchaid pills which Dr Washington Hacker cautioned her against using  02 AOZ:HYQM Rec Plan A = Automatic = Always=    Breztri  Take 2 puffs first thing in am and then another 2 puffs about 12 hours later.   Work on inhaler technique: >>>  Remember how golfers warm up by taking practice swings - do this with an empty inhaler  Plan B = Backup (to supplement plan A, not to replace it) Only use your bonchaid as needed  Make sure you check your oxygen  saturation at your highest level of activity(NOT after you stop)  to be sure it stays over 90%     01/18/2024  f/u ov/Heston Widener re:  COPD GOLD 3  maint on Breztri  2bid   Chief Complaint  Patient presents with   Follow-up    COPD f/u.   Dyspnea:  no change walking dog slow pace Cough: slt raspy > white mucus more since started breztri   Sleeping: flat bed one pillow s  resp cc  SABA use: prefers bronchaid 02: none  Lung cancer screening :  01/23/24    No obvious day to day or daytime variability or assoc excess/ purulent sputum or mucus plugs or hemoptysis or cp or chest tightness, subjective wheeze or overt sinus or hb symptoms.    Also denies any obvious fluctuation of symptoms with weather or environmental changes or other aggravating or alleviating factors except as outlined above   No unusual exposure hx or h/o childhood pna/ asthma or knowledge of premature  birth.  Current Allergies, Complete Past Medical History, Past Surgical History, Family History, and Social History were reviewed in [employer] record.  ROS  The following are not active complaints unless bolded Hoarseness, sore throat, dysphagia, dental problems, itching, sneezing,  nasal congestion or discharge of excess mucus or purulent secretions, ear ache,   fever, chills, sweats, unintended wt loss or wt gain, classically pleuritic or exertional cp,  orthopnea pnd or arm/hand swelling  or leg swelling, presyncope, palpitations, abdominal pain, anorexia, nausea, vomiting, diarrhea  or change in bowel habits or change in bladder habits, change in stools or change in urine, dysuria, hematuria,  rash, arthralgias, visual complaints, headache, numbness, weakness or ataxia or problems with walking or coordination,  change in mood or  memory.        Current Meds  Medication Sig   acetaminophen  (TYLENOL ) 325 MG tablet Take 325 mg by mouth as needed for moderate pain.   albuterol  (VENTOLIN  HFA) 108 (90 Base) MCG/ACT inhaler Inhale 2 puffs into the lungs every 4 (four) hours as needed for wheezing or shortness of breath.   amLODipine  (NORVASC ) 5 MG tablet Take 1 tablet (5 mg total) by mouth daily.   budeson-glycopyrrolate-formoterol  (BREZTRI  AEROSPHERE) 160-9-4.8 MCG/ACT  AERO inhaler 2 samples provided to patient   budeson-glycopyrrolate-formoterol  (BREZTRI  AEROSPHERE) 160-9-4.8 MCG/ACT AERO inhaler Inhale 2 puffs into the lungs in the morning and at bedtime.   budeson-glycopyrrolate-formoterol  (BREZTRI  AEROSPHERE) 160-9-4.8 MCG/ACT AERO Inhale 2 puffs into the lungs in the morning and at bedtime.   LORazepam  (ATIVAN ) 0.5 MG tablet TAKE 1 TABLET(0.5 MG) BY MOUTH TWICE DAILY AS NEEDED FOR ANXIETY   Multiple Vitamins-Minerals (MULTIVITAMIN WITH MINERALS) tablet Take 1 tablet by mouth daily.   valsartan -hydrochlorothiazide  (DIOVAN -HCT) 80-12.5 MG tablet TAKE 1 TABLET BY MOUTH  DAILY   Vitamin D , Ergocalciferol , (DRISDOL ) 1.25 MG (50000 UNIT) CAPS capsule Take 1 capsule (50,000 Units total) by mouth every 7 (seven) days for 12 doses.         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   COPD (chronic obstructive pulmonary disease) (HCC)    EPIGASTRIC PAIN 02/19/2010   Gallstones 07/22/2011   GERD (gastroesophageal reflux disease) 08/09/2013   Headache(784.0) 04/21/2007   Hyperlipidemia    HYPERLIPIDEMIA 10/21/2008   LEG PAIN, LEFT 08/20/2009   RASH-NONVESICULAR 06/26/2007   THYROID  DISORDER 04/21/2007   Thyroid  nodule 1978   benign   Vitamin D  deficiency 11/22/2011      Objective:    Wt Readings from Last 3 Encounters:  01/18/24 166 lb 12.8 oz (75.7 kg)  12/29/23 165 lb (74.8 kg)  11/24/23 164 lb 9.6 oz (74.7 kg)     Vital signs reviewed  01/18/2024  - Note at rest 02 sats  95% on RA   General appearance:    pleasant amb wf/ slt raspy voice with mild throat clearing     HEENT : Oropharynx  clear/ no pnds or cobblestoning/full dentures   Nasal turbinates nl    NECK :  without  apparent JVD/ palpable Nodes/TM    LUNGS: no acc muscle use,  Mild barrel  contour chest wall with bilateral  Distant bs s audible wheeze and  without cough on insp or exp maneuvers  and mild  Hyperresonant  to  percussion bilaterally     CV:  RRR  no s3 or murmur or increase in P2, and  Pos edema L > R (no change baseline)  ABD:  soft and nontender   MS:  Nl gait/ ext warm without deformities Or obvious joint restrictions  calf tenderness, cyanosis or clubbing     SKIN: warm and dry without lesions    NEURO:  alert, approp, nl sensorium with  no motor or cerebellar deficits apparent.       Assessment

## 2024-01-18 ENCOUNTER — Ambulatory Visit (INDEPENDENT_AMBULATORY_CARE_PROVIDER_SITE_OTHER): Admitting: Internal Medicine

## 2024-01-18 ENCOUNTER — Encounter: Payer: Self-pay | Admitting: Internal Medicine

## 2024-01-18 ENCOUNTER — Ambulatory Visit: Payer: Self-pay | Admitting: Internal Medicine

## 2024-01-18 VITALS — BP 134/82 | HR 78 | Temp 97.6°F | Ht 63.0 in | Wt 166.8 lb

## 2024-01-18 DIAGNOSIS — J449 Chronic obstructive pulmonary disease, unspecified: Secondary | ICD-10-CM | POA: Diagnosis not present

## 2024-01-18 DIAGNOSIS — E559 Vitamin D deficiency, unspecified: Secondary | ICD-10-CM

## 2024-01-18 DIAGNOSIS — Z87891 Personal history of nicotine dependence: Secondary | ICD-10-CM | POA: Diagnosis not present

## 2024-01-18 DIAGNOSIS — J9611 Chronic respiratory failure with hypoxia: Secondary | ICD-10-CM

## 2024-01-18 LAB — CBC WITH DIFFERENTIAL/PLATELET
Basophils Absolute: 0 10*3/uL (ref 0.0–0.1)
Basophils Relative: 0.4 % (ref 0.0–3.0)
Eosinophils Absolute: 0 10*3/uL (ref 0.0–0.7)
Eosinophils Relative: 0.6 % (ref 0.0–5.0)
HCT: 45.5 % (ref 36.0–46.0)
Hemoglobin: 14.6 g/dL (ref 12.0–15.0)
Lymphocytes Relative: 15.3 % (ref 12.0–46.0)
Lymphs Abs: 1 10*3/uL (ref 0.7–4.0)
MCHC: 32.1 g/dL (ref 30.0–36.0)
MCV: 97.8 fl (ref 78.0–100.0)
Monocytes Absolute: 0.3 10*3/uL (ref 0.1–1.0)
Monocytes Relative: 4.6 % (ref 3.0–12.0)
Neutro Abs: 5.4 10*3/uL (ref 1.4–7.7)
Neutrophils Relative %: 79.1 % — ABNORMAL HIGH (ref 43.0–77.0)
Platelets: 254 10*3/uL (ref 150.0–400.0)
RBC: 4.65 Mil/uL (ref 3.87–5.11)
RDW: 13.1 % (ref 11.5–15.5)
WBC: 6.8 10*3/uL (ref 4.0–10.5)

## 2024-01-18 LAB — BASIC METABOLIC PANEL WITH GFR
BUN: 12 mg/dL (ref 6–23)
CO2: 33 meq/L — ABNORMAL HIGH (ref 19–32)
Calcium: 9.5 mg/dL (ref 8.4–10.5)
Chloride: 90 meq/L — ABNORMAL LOW (ref 96–112)
Creatinine, Ser: 0.65 mg/dL (ref 0.40–1.20)
GFR: 84.57 mL/min (ref >60.00)
Glucose, Bld: 105 mg/dL — ABNORMAL HIGH (ref 70–99)
Potassium: 4.3 meq/L (ref 3.5–5.1)
Sodium: 131 meq/L — ABNORMAL LOW (ref 135–145)

## 2024-01-18 MED ORDER — BEVESPI AEROSPHERE 9-4.8 MCG/ACT IN AERO
2.0000 | INHALATION_SPRAY | Freq: Two times a day (BID) | RESPIRATORY_TRACT | 11 refills | Status: DC
Start: 2024-01-18 — End: 2024-03-06

## 2024-01-18 NOTE — Patient Instructions (Signed)
 Stubstitute bevespi Take 2 puffs first thing in am and then another 2 puffs about 12 hours later.   Work on inhaler technique:  relax and gently blow all the way out then take a nice smooth full deep breath back in, triggering the inhaler at same time you start breathing in.  Hold breath in for at least  5 seconds if you can. Blow out Breztri   thru nose. Rinse and gargle with water when done.  If mouth or throat bother you at all,  try brushing teeth/gums/tongue with arm and hammer toothpaste/ make a slurry and gargle and spit out.      Please remember to go to the lab department   for your tests - we will call you with the results when they are available.       Please schedule a follow up visit in 3 months but call sooner if needed

## 2024-01-19 DIAGNOSIS — Z87891 Personal history of nicotine dependence: Secondary | ICD-10-CM | POA: Insufficient documentation

## 2024-01-19 NOTE — Assessment & Plan Note (Signed)
 Quit smoking 2022 > LDSCT yearly until age 78 planned

## 2024-01-19 NOTE — Assessment & Plan Note (Addendum)
 Stopped smoking 2022 Eos 0.1  11/02/22  11/24/2023   Walked on RA  x  2  lap(s) =  approx 500 ft  @ avg pace, stopped due to end of study  with lowest 02 sats 94%  PFT's  05/01/17  FEV1 1.00 (48 % ) ratio 0.60  p 15 % improvement from saba p 0 prior to study with DLCO  17.8 (82%)   and FV curve mildly concave   11/24/2023  After extensive coaching inhaler device,  effectiveness =    60% with hfa (short Ti) > breztri  trial  01/18/2024  After extensive coaching inhaler device,  effectiveness =    70% with hfa  (short ti) on breztri  but raspy so try bevespi. 01/18/2024   Walked on RA  x  3  lap(s) =  approx 750  ft  @ nl pace, stopped due to end of study with lowest 02 sats 91%/ mild sob   01/18/2024   Eos 0.6  HC03 33  01/18/2024   alpha one AT >>>  Presently Pt is Group B in terms of symptom/risk and laba/lama therefore appropriate rx at this point >>>  bevespi or stiolto best choice given upper airway sympomts since started on breztri    F/u @ 3 m, sooner prn   Each maintenance medication was reviewed in detail including emphasizing most importantly the difference between maintenance and prns and under what circumstances the prns are to be triggered using an action plan format where appropriate.  Total time for H and P, chart review, counseling, reviewing hfa  device(s) , directly observing portions of ambulatory 02 saturation study/ and generating customized AVS unique to this office visit / same day charting = 30 min

## 2024-01-20 ENCOUNTER — Telehealth (HOSPITAL_BASED_OUTPATIENT_CLINIC_OR_DEPARTMENT_OTHER): Payer: Self-pay

## 2024-01-20 NOTE — Telephone Encounter (Signed)
 Copied from CRM [id number]. Topic: General - Other >> Jan 18, 2024  1:21 PM Eveleen Hinds B wrote: Reason for CRM: Patient received a message from Dr Waymond Hailey today. The message was not intended for her. Message stated he had all the information he needed and mentioned a future appointment with Dr Brent Cambric? Patient to make sure the correct patient got the message.

## 2024-01-21 LAB — ALPHA-1-ANTITRYPSIN PHENOTYP: A-1 Antitrypsin: 147 mg/dL (ref 101–187)

## 2024-01-23 ENCOUNTER — Ambulatory Visit: Payer: Self-pay | Admitting: Internal Medicine

## 2024-01-23 ENCOUNTER — Ambulatory Visit (HOSPITAL_BASED_OUTPATIENT_CLINIC_OR_DEPARTMENT_OTHER)
Admission: RE | Admit: 2024-01-23 | Discharge: 2024-01-23 | Disposition: A | Discharge status: Home / Self Care | Source: Ambulatory Visit | Attending: Internal Medicine | Admitting: Internal Medicine

## 2024-01-23 DIAGNOSIS — Z122 Encounter for screening for malignant neoplasm of respiratory organs: Secondary | ICD-10-CM | POA: Insufficient documentation

## 2024-01-23 DIAGNOSIS — Z13820 Encounter for screening for osteoporosis: Secondary | ICD-10-CM | POA: Diagnosis not present

## 2024-01-23 DIAGNOSIS — I2510 Atherosclerotic heart disease of native coronary artery without angina pectoris: Secondary | ICD-10-CM | POA: Insufficient documentation

## 2024-01-23 DIAGNOSIS — I700 Atherosclerosis of aorta: Secondary | ICD-10-CM | POA: Diagnosis not present

## 2024-01-23 DIAGNOSIS — Z780 Asymptomatic menopausal state: Secondary | ICD-10-CM | POA: Insufficient documentation

## 2024-01-23 DIAGNOSIS — R918 Other nonspecific abnormal finding of lung field: Secondary | ICD-10-CM | POA: Diagnosis not present

## 2024-01-23 DIAGNOSIS — M810 Age-related osteoporosis without current pathological fracture: Secondary | ICD-10-CM | POA: Diagnosis not present

## 2024-01-23 DIAGNOSIS — J439 Emphysema, unspecified: Secondary | ICD-10-CM | POA: Insufficient documentation

## 2024-01-23 DIAGNOSIS — Z87891 Personal history of nicotine dependence: Secondary | ICD-10-CM | POA: Diagnosis not present

## 2024-01-26 ENCOUNTER — Other Ambulatory Visit: Payer: Self-pay | Admitting: Internal Medicine

## 2024-01-26 ENCOUNTER — Ambulatory Visit (HOSPITAL_BASED_OUTPATIENT_CLINIC_OR_DEPARTMENT_OTHER)

## 2024-01-26 ENCOUNTER — Ambulatory Visit (INDEPENDENT_AMBULATORY_CARE_PROVIDER_SITE_OTHER): Admitting: Internal Medicine

## 2024-01-26 ENCOUNTER — Encounter: Payer: Self-pay | Admitting: Internal Medicine

## 2024-01-26 VITALS — BP 110/66 | HR 104 | Temp 97.8°F | Ht 63.0 in | Wt 165.5 lb

## 2024-01-26 DIAGNOSIS — M810 Age-related osteoporosis without current pathological fracture: Secondary | ICD-10-CM

## 2024-01-26 DIAGNOSIS — F419 Anxiety disorder, unspecified: Secondary | ICD-10-CM
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
## 2024-01-27 ENCOUNTER — Telehealth: Payer: Self-pay

## 2024-01-27 NOTE — Telephone Encounter (Signed)
 I spoke with Natalie Smith, pt's plan requires that she try both Reclast and Fosamax  before they will approve Prolia.

## 2024-01-27 NOTE — Telephone Encounter (Signed)
 Copied from CRM [id number]. Topic: Clinical - Medication Prior Auth >> Jan 27, 2024  9:10 AM Elle L wrote: Reason for CRM: Tammy with [employer] was requesting to speak to Isa Manuel regarding the patient's Prolia prior authorization. They only received the patient's DEXA scan and no other information. I reached out to the office who advised she was not available at this time. Their call back number is [phone] ext [phone].

## 2024-01-31 MED ORDER — ALENDRONATE SODIUM 70 MG PO TABS: 70.0000 mg | ORAL_TABLET | ORAL | 3 refills | Status: AC

## 2024-01-31 NOTE — Addendum Note (Signed)
 Addended by: Nicolina Barrios B on: 01/31/2024 10:49 AM   Modules accepted: Orders

## 2024-01-31 NOTE — Telephone Encounter (Signed)
Patient is aware and rx sent ?

## 2024-02-10 ENCOUNTER — Telehealth: Payer: Self-pay

## 2024-02-10 DIAGNOSIS — R911 Solitary pulmonary nodule: Secondary | ICD-10-CM

## 2024-02-10 NOTE — Telephone Encounter (Signed)
 Copied from CRM [id number]. Topic: Clinical - Lab/Test Results >> Feb 10, 2024 12:38 PM Jim Motts C wrote: Reason for CRM: Tiffany from Radiology [phone] was giving a confirmation call just to make sure that we received and were able to see the low dose ct scan results for patient. Advised I would send a note for acknowledgment to clinic.

## 2024-02-13 ENCOUNTER — Telehealth: Payer: Self-pay

## 2024-02-13 NOTE — Addendum Note (Signed)
 Addended by: Marquetta Sit on: 02/13/2024 01:07 PM   Modules accepted: Orders

## 2024-02-13 NOTE — Telephone Encounter (Signed)
 Copied from CRM [id number]. Topic: General - Other >> Feb 13, 2024  9:02 AM Oddis Bench wrote: Reason for CRM: Patient is calling returning call to  Dr. Marda Shack per the notes .

## 2024-02-13 NOTE — Telephone Encounter (Signed)
 Reviewed results of recent low-dose CT lung cancer screen with patient.  She had left upper lobe nodule which had increased from 6.9 mm back in April 2024 to 9.8 mm.  Also note of new 1 cm left upper lobe nodule central peribronchovascular concerning for possible malignancy.  Patient aware.  Set up PET scan.  She already sees pulmonary.  Will need to see either pulmonary or cardiothoracic surgery likely for tissue diagnosis.  Marquetta Sit MD [facility] at [facility]

## 2024-02-17 ENCOUNTER — Telehealth: Payer: Self-pay

## 2024-02-17 NOTE — Telephone Encounter (Signed)
 We received fax correspondence in regards to approval for PET scan with Date of service for 02/15/2024-03/31/2024. Authorization #- [id number]

## 2024-02-22 ENCOUNTER — Other Ambulatory Visit: Payer: Self-pay | Admitting: Internal Medicine

## 2024-02-22 DIAGNOSIS — F419 Anxiety disorder, unspecified: Secondary | ICD-10-CM

## 2024-03-01 ENCOUNTER — Encounter (HOSPITAL_COMMUNITY)
Admission: RE | Admit: 2024-03-01 | Discharge: 2024-03-01 | Disposition: A | Discharge status: Home / Self Care | Source: Ambulatory Visit | Attending: Family Medicine | Admitting: Family Medicine

## 2024-03-01 DIAGNOSIS — R911 Solitary pulmonary nodule: Secondary | ICD-10-CM | POA: Diagnosis not present

## 2024-03-01 DIAGNOSIS — R918 Other nonspecific abnormal finding of lung field: Secondary | ICD-10-CM | POA: Diagnosis not present

## 2024-03-01 LAB — GLUCOSE, CAPILLARY: Glucose-Capillary: 112 mg/dL — ABNORMAL HIGH (ref 70–99)

## 2024-03-01 MED ORDER — FLUDEOXYGLUCOSE F - 18 (FDG) INJECTION
8.2000 | Freq: Once | INTRAVENOUS | Status: AC | PRN
  Administered 2024-03-01: 8.7 via INTRAVENOUS

## 2024-03-05 ENCOUNTER — Telehealth: Payer: Self-pay | Admitting: Internal Medicine

## 2024-03-05 ENCOUNTER — Ambulatory Visit: Payer: Self-pay | Admitting: Internal Medicine

## 2024-03-05 MED ORDER — AMOXICILLIN-POT CLAVULANATE 875-125 MG PO TABS: 1.0000 | ORAL_TABLET | Freq: Two times a day (BID) | ORAL | 0 refills | Status: AC

## 2024-03-05 NOTE — Telephone Encounter (Signed)
 Copied from CRM [id number]. Topic: Clinical - Red Word Triage >> Mar 05, 2024 10:43 AM Rilla NOVAK wrote: [facility] that prompted transfer to Nurse Triage:  Breathing is worse, pt has pneumonia   ----------------------------------------------------------------------- From previous Reason for Contact - Scheduling: Patient/patient representative is calling to schedule an appointment. Refer to attachments for appointment information.    FYI Only or Action Required?: FYI only for provider.  Patient was last seen in primary care on 01/26/2024 by Theophilus Andrews, Tully GRADE, MD. Called Nurse Triage reporting Shortness of Breath. Symptoms are: gradually worsening.  Triage Disposition: See HCP Within 4 Hours (Or PCP Triage)  Patient/caregiver understands and will follow disposition?: Yes    Reason for Disposition  [1] Longstanding difficulty breathing (e.g., CHF, COPD, emphysema) AND [2] WORSE than normal    Appointment tomorrow, patient's PCP prescribing antibiotic  Answer Assessment - Initial Assessment Questions 1. RESPIRATORY STATUS: Describe your breathing? (e.g., wheezing, shortness of breath, unable to speak, severe coughing)      Shortness of breath  2. ONSET: When did this breathing problem begin?      Ongoing, worse recently  3. PATTERN Does the difficult breathing come and go, or has it been constant since it started?      Constant  4. SEVERITY: How bad is your breathing? (e.g., mild, moderate, severe)    - MILD: No SOB at rest, mild SOB with walking, speaks normally in sentences, can lie down, no retractions, pulse < 100.    - MODERATE: SOB at rest, SOB with minimal exertion and prefers to sit, cannot lie down flat, speaks in phrases, mild retractions, audible wheezing, pulse 100-120.    - SEVERE: Very SOB at rest, speaks in single words, struggling to breathe, sitting hunched forward, retractions, pulse > 120      Mild to moderate  5. RECURRENT SYMPTOM: Have you had  difficulty breathing before? If Yes, ask: When was the last time? and What happened that time?      Yes 6. CARDIAC HISTORY: Do you have any history of heart disease? (e.g., heart attack, angina, bypass surgery, angioplasty)      Hypertension  7. LUNG HISTORY: Do you have any history of lung disease?  (e.g., pulmonary embolus, asthma, emphysema)     COPD 8. CAUSE: What do you think is causing the breathing problem?      Pneumonia  9. OTHER SYMPTOMS: Do you have any other symptoms? (e.g., dizziness, runny nose, cough, chest pain, fever)     No 10. O2 SATURATION MONITOR:  Do you use an oxygen  saturation monitor (pulse oximeter) at home? If Yes, ask: What is your reading (oxygen  level) today? What is your usual oxygen  saturation reading? (e.g., 95%)       No  Protocols used: Breathing Difficulty-A-AH

## 2024-03-05 NOTE — Telephone Encounter (Signed)
 FYI Patient is aware.  Rx sent.  Patient will call Dr Darlean.  Patient has an appointment with Dr Sherrod 03/13/24.

## 2024-03-05 NOTE — Telephone Encounter (Signed)
 Reviewed Low dose CT and PET scan results. Findings are suspicious for malignancy. Also early PNA. Please send Rx for Augmentin  875 mg BID for 7 days. Please also arrange urgent follow up with her pulmonologist.

## 2024-03-05 NOTE — Telephone Encounter (Signed)
 Reviewed PET scan results with patient.  2 hypermetabolic nodules left upper lobe.  There was comment of possible infiltrate right lung but she denies any cough, fever, or chills.  She has chronic dyspnea which is unchanged.  Setting up urgent referral to oncology for further evaluation Patient knows to contact us  immediately for any fever, increased cough, or other concerns  Wolm LELON Scarlet MD [facility]

## 2024-03-05 NOTE — Addendum Note (Signed)
 Addended by: KATHRYNE MILLMAN B on: 03/05/2024 10:41 AM   Modules accepted: Orders

## 2024-03-05 NOTE — Addendum Note (Signed)
 Addended by: MICHEAL WOLM ORN on: 03/05/2024 08:03 AM   Modules accepted: Orders

## 2024-03-06 ENCOUNTER — Encounter: Payer: Self-pay | Admitting: Adult Health

## 2024-03-06 ENCOUNTER — Ambulatory Visit (INDEPENDENT_AMBULATORY_CARE_PROVIDER_SITE_OTHER): Admitting: Adult Health

## 2024-03-06 DIAGNOSIS — J449 Chronic obstructive pulmonary disease, unspecified: Secondary | ICD-10-CM

## 2024-03-06 DIAGNOSIS — Z87891 Personal history of nicotine dependence: Secondary | ICD-10-CM

## 2024-03-06 MED ORDER — ALBUTEROL SULFATE HFA 108 (90 BASE) MCG/ACT IN AERS: 1.0000 | INHALATION_SPRAY | Freq: Four times a day (QID) | RESPIRATORY_TRACT | 2 refills | Status: DC | PRN

## 2024-03-06 NOTE — Progress Notes (Signed)
 @Patient  ID: Natalie Smith, female    DOB: 1946-02-09, 78 y.o.   MRN: [id number]  Chief Complaint  Patient presents with   Follow-up    Referring provider: Theophilus Andrews, Jonna*  HPI: 78 year old female former smoker followed for COPD  TEST/EVENTS :  Eos 0.1  11/02/22  11/24/2023   Walked on RA  x  2  lap(s) =  approx 500 ft  @ avg pace, stopped due to end of study  with lowest 02 sats 94%  PFT's  05/01/17  FEV1 1.00 (48 % ) ratio 0.60  p 15 % improvement from saba p 0 prior to study with DLCO  17.8 (82%)   and FV curve mildly concave   01/18/2024   Walked on RA  x  3  lap(s) =  approx 750  ft  @ nl pace, stopped due to end of study with lowest 02 sats 91%/ mild sob   01/18/2024   Eos 0.6  HC03 33  Alpha 1 MM 147  LDCT chest 01/23/24 -mild emphysema.  Increased in size solid 9.8 mm left upper lobe nodule, new 10 mm left upper lobe nodule PET scan March 01, 2024 2 left upper lobe nodules hypermetabolic and suspicious, hazy airspace opacity in the right upper and right middle lobe concerning for possible pneumonia  03/06/2024 Acute OV : Pneumonia and abnormal CT chest Discussed the use of AI scribe software for clinical note transcription with the patient, who gave verbal consent to proceed.  History of Present Illness   Natalie Smith is a 78 year old female with COPD who presents with worsening shortness of breath and recent pneumonia diagnosis.  She has experienced worsening shortness of breath over the past week, initially attributing it to her COPD and the heat. She also has headaches and general malaise. No cough, fever, or congestion. She began feeling unwell over the weekend and was prescribed Augmentin  for 10 days  for pneumonia, which she started yesterday. She has taken three doses so far and is tolerating it well.  CT chest Jan 23, 2024 showed mild emphysema and increased left upper lobe lung nodule measuring 9.8 mm and a new central left upper lobe nodule measuring 10 mm.  She was  sent for a subsequent PET scan that was completed on March 01, 2024 that showed 2 left upper lobe nodules-hypermetabolic and suspicious for malignancy.  SUV max 4.4.  No findings of metastatic disease, patchy airspace opacity in the right upper and right middle lobe.-Possible early pneumonia.  As above patient was given a 10-day course of Augmentin  for potential pneumonia.  And was referred to oncology which she has an appointment with next week.  She denies any hemoptysis, unintentional weight loss.  She is currently on Breztri , two puffs in the morning and evening, and has been using it more frequently than prescribed. She does not have an emergency inhaler but used to have albuterol . She works part-time at [employer] .  Denies any significant cough, congestion or fever.  She has a history of heavy smoking but quit three years ago. She lives independently, drives, and performs her house chores. Her last breathing test in 2018 showed a FEV1 at 42%, postbronchodilator FEV1 48% .       Allergies  Allergen Reactions   Codeine Nausea And Vomiting   Tramadol  Itching and Nausea And Vomiting    Immunization History  Administered Date(s) Administered   Fluad Quad(high Dose 65+) 11/02/2022   Fluad Trivalent(High Dose 65+) 07/05/2023  Influenza, High Dose Seasonal PF 08/09/2013, 06/05/2021   Moderna Sars-Covid-2 Vaccination 06/05/2021   PFIZER(Purple Top)SARS-COV-2 Vaccination 09/28/2019, 11/01/2019, 08/06/2020   Pneumococcal Conjugate-13 02/07/2014   Pneumococcal Polysaccharide-23 02/11/2011, 04/25/2020   Td 03/03/2016   Zoster, Live 10/21/2008    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   COPD (chronic obstructive pulmonary disease) (HCC)    EPIGASTRIC PAIN 02/19/2010   Gallstones 07/22/2011   GERD (gastroesophageal  reflux disease) 08/09/2013   Headache(784.0) 04/21/2007   Hyperlipidemia    HYPERLIPIDEMIA 10/21/2008   LEG PAIN, LEFT 08/20/2009   RASH-NONVESICULAR 06/26/2007   THYROID  DISORDER 04/21/2007   Thyroid  nodule 1978   benign   Vitamin D  deficiency 11/22/2011    Tobacco History: Social History   Tobacco Use  Smoking Status Former   Current packs/day: 1.00   Average packs/day: 1 pack/day for 63.1 years (63.1 ttl pk-yrs)   Types: Cigarettes   Start date: 01/12/1961  Smokeless Tobacco Never  Tobacco Comments   last cigarette 2 weeks ago.   Counseling given: Not Answered Tobacco comments: last cigarette 2 weeks ago.   Outpatient Medications Prior to Visit  Medication Sig Dispense Refill   acetaminophen  (TYLENOL ) 325 MG tablet Take 325 mg by mouth as needed for moderate pain.     alendronate  (FOSAMAX ) 70 MG tablet Take 1 tablet (70 mg total) by mouth once a week. Take with a full glass of water on an empty stomach. 12 tablet 3   amLODipine  (NORVASC ) 5 MG tablet Take 1 tablet (5 mg total) by mouth daily. 90 tablet 1   amoxicillin -clavulanate (AUGMENTIN ) 875-125 MG tablet Take 1 tablet by mouth 2 (two) times daily. 20 tablet 0   BREZTRI  AEROSPHERE 160-9-4.8 MCG/ACT AERO inhaler Inhale 2 puffs into the lungs in the morning and at bedtime.     LORazepam  (ATIVAN ) 0.5 MG tablet TAKE 1 TABLET(0.5 MG) BY MOUTH TWICE DAILY AS NEEDED FOR ANXIETY 45 tablet 0   valsartan -hydrochlorothiazide  (DIOVAN -HCT) 80-12.5 MG tablet TAKE 1 TABLET BY MOUTH DAILY 90 tablet 0   Multiple Vitamins-Minerals (MULTIVITAMIN WITH MINERALS) tablet Take 1 tablet by mouth daily.     Vitamin D , Ergocalciferol , (DRISDOL ) 1.25 MG (50000 UNIT) CAPS capsule Take 1 capsule (50,000 Units total) by mouth every 7 (seven) days for 12 doses. (Patient not taking: Reported on 03/06/2024) 12 capsule 0   albuterol  (VENTOLIN  HFA) 108 (90 Base) MCG/ACT inhaler Inhale 2 puffs into the lungs every 4 (four) hours as needed for wheezing or  shortness of breath. 8 g 2   Glycopyrrolate-Formoterol  (BEVESPI  AEROSPHERE) 9-4.8 MCG/ACT AERO Inhale 2 puffs into the lungs 2 (two) times daily. Take 2 puffs first thing in am and then another 2 puffs about 12 hours later. (Patient not taking: Reported on 03/06/2024) 1 each 11   No facility-administered medications prior to visit.     Review of Systems:   Constitutional:   No  weight loss, night sweats,  Fevers, chills,+ fatigue, or  lassitude.  HEENT:   No headaches,  Difficulty swallowing,  Tooth/dental problems, or  Sore throat,                No sneezing, itching, ear ache,+ nasal congestion, post nasal drip,   CV:  No chest pain,  Orthopnea, PND, swelling in lower extremities, anasarca, dizziness, palpitations, syncope.   GI  No heartburn, indigestion, abdominal pain, nausea, vomiting, diarrhea, change in bowel habits, loss of appetite, bloody stools.   Resp:   No chest wall deformity  Skin: no rash or lesions.  GU: no dysuria, change in color of urine, no urgency or frequency.  No flank pain, no hematuria   MS:  No joint pain or swelling.  No decreased range of motion.  No back pain.    Physical Exam  BP 108/64 (BP Location: Left Arm, Patient Position: Sitting, Cuff Size: Normal)   Pulse 82   Temp 97.7 F (36.5 C) (Oral)   Ht 5' 3 (1.6 m)   Wt 165 lb 9.6 oz (75.1 kg)   SpO2 92%   BMI 29.33 kg/m   GEN: A/Ox3; pleasant , NAD, well nourished    HEENT:  [place]/AT,  NOSE-clear, THROAT-clear, no lesions, no postnasal drip or exudate noted.   NECK:  Supple w/ fair ROM; no JVD; normal carotid impulses w/o bruits; no thyromegaly or nodules palpated; no lymphadenopathy.    RESP  Clear  P & A; w/o, wheezes/ rales/ or rhonchi. no accessory muscle use, no dullness to percussion  CARD:  RRR, no m/r/g, no peripheral edema, pulses intact, no cyanosis or clubbing.  GI:   Soft & nt; nml bowel sounds; no organomegaly or masses detected.   Musco: Warm bil, no deformities or joint  swelling noted.   Neuro: alert, no focal deficits noted.    Skin: Warm, no lesions or rashes    Lab Results:  CBC   BNP    Component Value Date/Time   BNP 72.3 10/03/2018 1355    ProBNP No results found for: PROBNP  Imaging: NM PET Image Initial (PI) Skull Base To Thigh (F-18 FDG) Result Date: 03/01/2024 CLINICAL DATA:  Initial treatment strategy for lung nodule. EXAM: NUCLEAR MEDICINE PET SKULL BASE TO THIGH TECHNIQUE: 8.7 mCi F-18 FDG was injected intravenously. Full-ring PET imaging was performed from the skull base to thigh after the radiotracer. CT data was obtained and used for attenuation correction and anatomic localization. Fasting blood glucose: 112 mg/dl COMPARISON:  94/80/7974 FINDINGS: Mediastinal blood pool activity: SUV max 2.4 Liver activity: SUV max NA NECK: No significant abnormal hypermetabolic activity in this region. Incidental CT findings: Right common carotid atheromatous vascular calcification. CHEST: The more cephalad left upper lobe nodule posteriorly on image 52 series 4 measures 1.0 by 0.7 cm and has a maximum SUV of 3.4, suspicious for malignancy. The peribronchovascular nodule measuring approximately 1.2 by 0.8 cm on image 60 series 4 has a maximum SUV of 4.4, suspicious for malignancy. Incidental CT findings: Coronary, aortic arch, and branch vessel atherosclerotic vascular disease. Emphysema. Hazy airspace opacity posteriorly in the right upper lobe and to a lesser extent in the right middle lobe potentially reflecting early pneumonia. Mild scarring or atelectasis in the lingula and right middle lobe. ABDOMEN/PELVIS: No significant abnormal hypermetabolic activity in this region. Incidental CT findings: Cholelithiasis. Atherosclerosis is present, including aortoiliac atherosclerotic disease. Sigmoid colon diverticulosis. SKELETON: No significant abnormal hypermetabolic activity in this region. Incidental CT findings: None. IMPRESSION: 1. The 2 left upper lobe  nodules are hypermetabolic and suspicious for malignancy. 2. No findings of metastatic disease. 3. Hazy new airspace opacity posteriorly in the right upper lobe and to a lesser extent in the right middle lobe potentially reflecting early pneumonia. 4. Cholelithiasis. 5. Sigmoid colon diverticulosis. 6. Aortic Atherosclerosis (ICD10-I70.0) and Emphysema (ICD10-J43.9). Electronically Signed   By: Ryan Salvage M.D.   On: 03/01/2024 15:45  Administration History     None          Latest Ref Rng & Units 04/21/2017   10:57 AM  PFT Results  FVC-Pre L 1.37   FVC-Predicted Pre % 50   FVC-Post L 1.66   FVC-Predicted Post % 61   Pre FEV1/FVC % % 63   Post FEV1/FCV % % 60   FEV1-Pre L 0.86   FEV1-Predicted Pre % 42   FEV1-Post L 1.00   DLCO uncorrected ml/min/mmHg 17.81   DLCO UNC% % 82   DLCO corrected ml/min/mmHg 17.86   DLCO COR %Predicted % 82   DLVA Predicted % 100   TLC L 4.83   TLC % Predicted % 101   RV % Predicted % 155     No results found for: NITRICOXIDE      Assessment & Plan:   No problem-specific Assessment & Plan notes found for this encounter.  Assessment and Plan    Left upper lobe Pulmonary nodules with suspicion of malignancy   2 Pulmonary nodules in the left upper lobe, measuring 1 cm and 9.8 mm, Hypermetabolic on PET scan, suggesting malignancy. No enlarged lymph nodes noted.  Consider biopsy - needed to confirm the diagnosis-tissue sampling.  Discuss potential biopsy procedures, including navigational bronchoscopy, and associated risks such as infection, bleeding, and lung collapse. Engage in shared decision-making regarding biopsy.  Follow up with oncologist Dr. Sherrod on July 8 to discuss  further evaluation and management. Consider navigational bronchoscopy for biopsy if recommended by the oncologist. Please reach out to our group if would like to proceed with biopsy.   Pneumonia  -RLL/RML  Suspected pneumonia is based on a PET scan showing  patchy airspace disease in the right lung. Symptoms include feeling terrible, shortness of breath, and headaches, with no cough, fever, or congestion. Continue Augmentin  for 10 days as prescribed and monitor symptoms while completing the antibiotic course. Will need serial follow up for clearance.  Follow up with Dr. Darlean  as planned next month and As needed     Chronic Obstructive Pulmonary Disease (COPD)   COPD is managed with Breztri  twice daily-mild flare in setting of possible pneumonia. SABRA Bombard antibiotics with Augmentin . Continue Breztri , two puffs in the morning and two puffs in the evening, and rinse after use. Use albuterol  as needed for rescue.  Will recommend PFT going forward to establish current baseline lung function.      Plan  Patient Instructions  Finish Augmentin  as directed .  Breztri  2 puffs Twice daily, rinse after use.  Albuterol  inhaler As needed   Follow up with Oncology next week.  Follow up with Dr. Darlean  next month as needed.  Please contact office for sooner follow up if symptoms do not improve or worsen or seek emergency care         Madelin Stank, NP 03/06/2024

## 2024-03-06 NOTE — Patient Instructions (Signed)
 Finish Augmentin  as directed .  Breztri  2 puffs Twice daily, rinse after use.  Albuterol  inhaler As needed   Follow up with Oncology next week.  Follow up with Dr. Darlean  next month as needed.  Please contact office for sooner follow up if symptoms do not improve or worsen or seek emergency care

## 2024-03-12 ENCOUNTER — Ambulatory Visit: Payer: Self-pay | Admitting: Internal Medicine

## 2024-03-12 ENCOUNTER — Ambulatory Visit: Payer: Self-pay

## 2024-03-12 ENCOUNTER — Ambulatory Visit: Payer: Self-pay | Admitting: *Deleted

## 2024-03-12 NOTE — Telephone Encounter (Signed)
See previous triage encounter.

## 2024-03-12 NOTE — Telephone Encounter (Signed)
 FYI Only or Action Required?: FYI only for provider.  Patient is followed in Pulmonology for COPD,  last seen on 03/06/2024 by [name], Madelin RAMAN, NP.  Called Nurse Triage reporting Shortness of Breath. Symptoms began Wednesday of last week.  Interventions attempted: Rescue inhaler and Maintenance inhaler.  Symptoms are: gradually worsening.  Triage Disposition: Go to ED Now (Notify PCP)  Patient/caregiver understands and will follow disposition?: yes   Patient/caregiver understands and will follow disposition?: Copied from CRM [id number]. Topic: Clinical - Red Word Triage >> Mar 12, 2024  8:17 AM Nathanel DEL wrote: Red Word that prompted transfer to Nurse Triage: she woke up yesterday O2 was 80 Today it was 70.  She is SOB.  Low for 5 days Reason for Disposition  Oxygen  level (e.g., pulse oximetry) 90 percent or lower  Answer Assessment - Initial Assessment Questions Patient calling in with complaints of shortness of breath and decreasing O2 sats with any activity. Patient has experienced symptoms since last Wed and symptoms have worsened since the visit on last Tuesday with Pulmonary. Patient does not wear any oxygent at ome. Patient states she can not walk 10 steps without feeling short of breath and sats dropping. While on the call sitting sats were 91%, but when getting up to walk sats dropped to 86%. Patient hesitant to go to ED, but advised patient that due to her symptoms this is the most appropriate level of care. Patient agreeable and stated she would call her granddaughter to take her. Patient did not want to call 911.     1. RESPIRATORY STATUS: Describe your breathing? (e.g., wheezing, shortness of breath, unable to speak, severe coughing)      SOB with any activity  2. ONSET: When did this breathing problem begin?      Wednesday of last week, recent pulmonary visit on Tuesday, but has worsened since then.  3. PATTERN Does the difficult breathing come and go, or has it been constant  since it started?      Constant worsens with activity  4. SEVERITY: How bad is your breathing? (e.g., mild, moderate, severe)    - MILD: No SOB at rest, mild SOB with walking, speaks normally in sentences, can lie down, no retractions, pulse < 100.    - MODERATE: SOB at rest, SOB with minimal exertion and prefers to sit, cannot lie down flat, speaks in phrases, mild retractions, audible wheezing, pulse 100-120.    - SEVERE: Very SOB at rest, speaks in single words, struggling to breathe, sitting hunched forward, retractions, pulse > 120     Patient states that she can get her sats to come up with deep breathing when sitting down, but any activity causes it to decrease  5. RECURRENT SYMPTOM: Have you had difficulty breathing before? If Yes, ask: When was the last time? and What happened that time?      Yes  6. CARDIAC HISTORY: Do you have any history of heart disease? (e.g., heart attack, angina, bypass surgery, angioplasty)  HTN      7. LUNG HISTORY: Do you have any history of lung disease?  (e.g., pulmonary embolus, asthma, emphysema)     COPD 8. CAUSE: What do you think is causing the breathing problem?      Patietn states that a recent PET scan showed that she had pnuemonia  9. OTHER SYMPTOMS: Do you have any other symptoms? (e.g., dizziness, runny nose, cough, chest pain, fever)     No other symptoms voiced during the  call  10. O2 SATURATION MONITOR:  Do you use an oxygen  saturation monitor (pulse oximeter) at home? If Yes, ask: What is your reading (oxygen  level) today? What is your usual oxygen  saturation reading? (e.g., 95%)       Yes usually in the 90s, while on the phone with nurse today, sitting sats-91%. While walking during the call sats dropped down to 86%  Protocols used: Breathing Difficulty-A-AH

## 2024-03-12 NOTE — Telephone Encounter (Signed)
 E2C2 Pulmonary Triage - Initial Assessment Questions "Chief Complaint (e.g., cough, sob, wheezing, fever, chills, sweat or additional symptoms) *Go to specific symptom protocol after initial questions. SOB with getting up and waling around   "How long have symptoms been present?" Started about Wednesday, but never been to this point  Have you tested for COVID or Flu? Note: If not, ask patient if a home test can be taken. If so, instruct patient to call back for positive results. Yes  MEDICINES:   "Have you used any OTC meds to help with symptoms?" No If yes, ask "What medications?" No  "Have you used your inhalers/maintenance medication?" Yes If yes, "What medications?" Bredstry and Albuterol -but has not taken this morning.   If inhaler, ask "How many puffs and how often?" Note: Review instructions on medication in the chart. Albuterol  as I can. 2 puffs  OXYGEN : "Do you wear supplemental oxygen ?" No If yes, "How many liters are you supposed to use?" N/a  "Do you monitor your oxygen  levels?" Yes If yes, What is your reading (oxygen  level) today? Average is in 90s, hx of COPD.    What is your usual oxygen  saturation reading?  (Note: Pulmonary O2 sats should be 90% or greater) Usually in the 90s. Sat while on the phone is 91%. With getting up it drops to the 80s. Any activity drops sats

## 2024-03-12 NOTE — Telephone Encounter (Signed)
 First attempt to call pt back, no answer, LVM for call back to pulm office. Placed in call back.   Copied from CRM [id number]. Topic: Clinical - Red Word Triage >> Mar 12, 2024  8:35 AM Corean SAUNDERS wrote: Red Word that prompted transfer to Nurse Triage: Low oxygen  levels at 70 (requested a call back)

## 2024-03-12 NOTE — Telephone Encounter (Signed)
 FYI

## 2024-03-13 ENCOUNTER — Ambulatory Visit: Admitting: Internal Medicine

## 2024-03-13 ENCOUNTER — Other Ambulatory Visit
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
## 2024-03-18 ENCOUNTER — Other Ambulatory Visit: Payer: Self-pay | Admitting: Physician Assistant

## 2024-03-18 DIAGNOSIS — R911 Solitary pulmonary nodule: Secondary | ICD-10-CM

## 2024-03-19 ENCOUNTER — Inpatient Hospital Stay (HOSPITAL_BASED_OUTPATIENT_CLINIC_OR_DEPARTMENT_OTHER): Admitting: Physician Assistant

## 2024-03-19 ENCOUNTER — Telehealth: Payer: Self-pay

## 2024-03-19 ENCOUNTER — Inpatient Hospital Stay: Attending: Internal Medicine

## 2024-03-19 VITALS — BP 156/76 | HR 92 | Temp 97.8°F | Resp 17 | Ht 63.0 in | Wt 164.3 lb

## 2024-03-19 DIAGNOSIS — Z79899 Other long term (current) drug therapy: Secondary | ICD-10-CM | POA: Insufficient documentation

## 2024-03-19 DIAGNOSIS — I10 Essential (primary) hypertension: Secondary | ICD-10-CM | POA: Diagnosis not present

## 2024-03-19 DIAGNOSIS — E559 Vitamin D deficiency, unspecified: Secondary | ICD-10-CM | POA: Diagnosis not present

## 2024-03-19 DIAGNOSIS — R911 Solitary pulmonary nodule: Secondary | ICD-10-CM

## 2024-03-19 DIAGNOSIS — K219 Gastro-esophageal reflux disease without esophagitis: Secondary | ICD-10-CM | POA: Diagnosis not present

## 2024-03-19 DIAGNOSIS — R918 Other nonspecific abnormal finding of lung field: Secondary | ICD-10-CM | POA: Insufficient documentation

## 2024-03-19 DIAGNOSIS — Z87891 Personal history of nicotine dependence: Secondary | ICD-10-CM

## 2024-03-19 DIAGNOSIS — J449 Chronic obstructive pulmonary disease, unspecified: Secondary | ICD-10-CM | POA: Insufficient documentation

## 2024-03-19 LAB — CBC WITH DIFFERENTIAL (CANCER CENTER ONLY)
Abs Immature Granulocytes: 0.03 K/uL (ref 0.00–0.07)
Basophils Absolute: 0 K/uL (ref 0.0–0.1)
Basophils Relative: 1 %
Eosinophils Absolute: 0.2 K/uL (ref 0.0–0.5)
Eosinophils Relative: 2 %
HCT: 41.3 % (ref 36.0–46.0)
Hemoglobin: 13.6 g/dL (ref 12.0–15.0)
Immature Granulocytes: 0 %
Lymphocytes Relative: 9 %
Lymphs Abs: 0.6 K/uL — ABNORMAL LOW (ref 0.7–4.0)
MCH: 31.3 pg (ref 26.0–34.0)
MCHC: 32.9 g/dL (ref 30.0–36.0)
MCV: 94.9 fL (ref 80.0–100.0)
Monocytes Absolute: 0.6 K/uL (ref 0.1–1.0)
Monocytes Relative: 8 %
Neutro Abs: 5.7 K/uL (ref 1.7–7.7)
Neutrophils Relative %: 80 %
Platelet Count: 276 K/uL (ref 150–400)
RBC: 4.35 MIL/uL (ref 3.87–5.11)
RDW: 12 % (ref 11.5–15.5)
WBC Count: 7.2 K/uL (ref 4.0–10.5)
nRBC: 0 % (ref 0.0–0.2)

## 2024-03-19 LAB — CMP (CANCER CENTER ONLY)
ALT: 9 U/L (ref 0–44)
AST: 11 U/L — ABNORMAL LOW (ref 15–41)
Albumin: 3.6 g/dL (ref 3.5–5.0)
Alkaline Phosphatase: 65 U/L (ref 38–126)
Anion gap: 5 (ref 5–15)
BUN: 9 mg/dL (ref 8–23)
CO2: 33 mmol/L — ABNORMAL HIGH (ref 22–32)
Calcium: 8.8 mg/dL — ABNORMAL LOW (ref 8.9–10.3)
Chloride: 98 mmol/L (ref 98–111)
Creatinine: 0.6 mg/dL (ref 0.44–1.00)
GFR, Estimated: >60 mL/min (ref >60)
Glucose, Bld: 102 mg/dL — ABNORMAL HIGH (ref 70–99)
Potassium: 3.8 mmol/L (ref 3.5–5.1)
Sodium: 136 mmol/L (ref 135–145)
Total Bilirubin: 0.5 mg/dL (ref 0.0–1.2)
Total Protein: 6.7 g/dL (ref 6.5–8.1)

## 2024-03-19 NOTE — Progress Notes (Unsigned)
 SATURATION QUALIFICATIONS: (This note is used to comply with regulatory documentation for home oxygen )  Patient Saturations on Room Air at Rest = 90%  Patient Saturations on Room Air while Ambulating = 80%  Patient Saturations on 2 Liters of oxygen  while Ambulating = 90%  Please briefly explain why patient needs home oxygen : There are no other means to increase oxygen  levels.

## 2024-03-19 NOTE — Telephone Encounter (Signed)
 [facility] appointment today. During visit, noted to have low oxygen  saturation. In-office oxygen  testing was performed; results documented in the chart.  Called [facility] and spoke with Gem to inform her that oxygen  testing had been completed and to request that their office assist with ordering home oxygen . Gem stated she sent a message to the nurse, and we are currently awaiting a return call.

## 2024-03-20 ENCOUNTER — Telehealth: Payer: Self-pay | Admitting: Radiation Oncology

## 2024-03-20 ENCOUNTER — Telehealth (HOSPITAL_BASED_OUTPATIENT_CLINIC_OR_DEPARTMENT_OTHER): Payer: Self-pay

## 2024-03-20 DIAGNOSIS — J9611 Chronic respiratory failure with hypoxia: Secondary | ICD-10-CM

## 2024-03-20 NOTE — Telephone Encounter (Signed)
 Patient called inquiring about oxygen  orders. Informed patient that the oxygen  test was performed in our office, results were documented, and a message was sent to Dr. Chari office for follow-up regarding the oxygen  order. Patient voiced understanding and was advised to call with any worsening concerns.

## 2024-03-20 NOTE — Telephone Encounter (Signed)
LVM to schedule CON with Dr.Squire

## 2024-03-21 ENCOUNTER — Telehealth: Payer: Self-pay | Admitting: Radiation Oncology

## 2024-03-21 ENCOUNTER — Telehealth: Payer: Self-pay | Admitting: Physician Assistant

## 2024-03-21 NOTE — Telephone Encounter (Signed)
 It was done at her Oncology Visit which is in Epic also

## 2024-03-21 NOTE — Telephone Encounter (Signed)
 Left a voicemail with the scheduled appointment details.

## 2024-03-21 NOTE — Telephone Encounter (Signed)
 Darlean Ozell NOVAK, MD to Trudy Warren CROME, CMA  [place] Sonny HERO, [facility]     03/20/24  4:25 PM Will ask my nurse to see what we can do for the pt with the data obtained or get her in 7/16 to sort al this out  Trudy Warren CROME, CMA to Darlean Ozell NOVAK, MD     03/20/24  3:21 PM I have received a secure chat from the Oncology office regarding oxygen :This patient was seen with us  yesterday as a new patient and her breathing was worse- so I done a walking oxygen  test on her and documented it.  I called [facility] yesterday and spoke with Gem or Jem (Im not sure of spelling) and she was going to relay the information to Dr. Chari staff so oxygen  could be ordered for the patient.I see no previous telephone encounter for this patient discussing walk. Can you please advise on oxygen  order or how we should proceed with this, Sats are documented under oncology documentation in chart

## 2024-03-21 NOTE — Telephone Encounter (Signed)
 Called pt who asked to c/b. Called pt after allotted time requested, able to schedule consult for 7/22@10 :30am. All details provided to pt regarding visit.

## 2024-03-21 NOTE — Telephone Encounter (Signed)
 Natalie Smith Natalie Smith; Natalie Smith Natalie Smith; Natalie Smith, Natalie Smith; Natalie Smith, Natalie Smith i am not seeing sats within the last 30days.  Please advise

## 2024-03-21 NOTE — Telephone Encounter (Signed)
 Per Dr Darlean- ok to order 2lpm o2 for the pt  I have placed referral for this Routing to [facility] so they know this is needing attention  Please let me know if the DME can not take the order She was qualified at her oncology appt   Author: Laurelyn Rosina NOVAK, LPN Author Type: Licensed Practical Nurse Filed: 03/19/2024  2:53 PM  Note Status: Sign when Signing Visit Cosign: Cosign Not Required Encounter Date: 03/19/2024  Editor: Laurelyn Rosina NOVAK, LPN (Licensed Practical Nurse)              SATURATION QUALIFICATIONS: (This note is used to comply with regulatory documentation for home oxygen )   Patient Saturations on Room Air at Rest = 90%   Patient Saturations on Room Air while Ambulating = 80%   Patient Saturations on 2 Liters of oxygen  while Ambulating = 90%   Please briefly explain why patient needs home oxygen : There are no other means to increase oxygen  levels.

## 2024-03-22 ENCOUNTER — Encounter: Payer: Self-pay | Admitting: Internal Medicine

## 2024-03-22 DIAGNOSIS — J9611 Chronic respiratory failure with hypoxia: Secondary | ICD-10-CM | POA: Insufficient documentation

## 2024-03-22 NOTE — Assessment & Plan Note (Signed)
 03/20/23  SATURATION QUALIFICATIONS: (This note is used to comply with regulatory documentation for home oxygen )   Patient Saturations on Room Air at Rest = 90%   Patient Saturations on Room Air while Ambulating = 80%   Patient Saturations on 2 Liters of oxygen  while Ambulating = 90%   Please briefly explain why patient needs home oxygen : There are no other means to increase oxygen  levels.   Rec 2lpm 24/7 made on  03/22/2024

## 2024-03-23 ENCOUNTER — Telehealth: Payer: Self-pay

## 2024-03-23 ENCOUNTER — Other Ambulatory Visit: Payer: Self-pay

## 2024-03-23 DIAGNOSIS — J9611 Chronic respiratory failure with hypoxia: Secondary | ICD-10-CM

## 2024-03-23 DIAGNOSIS — J449 Chronic obstructive pulmonary disease, unspecified: Secondary | ICD-10-CM

## 2024-03-23 NOTE — Telephone Encounter (Signed)
 Copied from CRM [id number]. Topic: Clinical - Order For Equipment >> Mar 23, 2024 10:57 AM Isabell A wrote: Reason for CRM: Patient was told by [employer] that the order for her oxygen  is missing the concentration.   Sent in new order and spoke with patient     NFN-

## 2024-03-26 ENCOUNTER — Other Ambulatory Visit: Payer: Self-pay | Admitting: Adult Health

## 2024-03-26 ENCOUNTER — Telehealth: Payer: Self-pay | Admitting: Internal Medicine

## 2024-03-26 DIAGNOSIS — F419 Anxiety disorder, unspecified: Secondary | ICD-10-CM

## 2024-03-26 NOTE — Telephone Encounter (Signed)
 Per Avelina at [facility]-  Pt received 5L concentrator and home fill on 03/24/2024. Is there any other need?  Is this all the patient needs or is anymore needed? Please advise. Thanks!

## 2024-03-26 NOTE — Progress Notes (Signed)
 Thoracic Location of Tumor / Histology:  Lung Nodule  Patient presented months ago with symptoms of:  [name], Natalie Smith She initially experienced worsening shortness of breath over the past week. She states she has headaches and general malaise.Natalie Smith  Biopsies revealed:  03/01/2024 PET Scan     01/23/2024 CT Chest    Tobacco/Marijuana/Snuff/ETOH use:  History of heavy smoking  (55 years averaging over 1 ppd) and COPD. Quit smoking four years ago.  Past/lung nodules Anticipated interventions by Radiation oncology if any:  Natalie Smith, Natalie Smith  Dr. Izell recommends stereotactic Body Radiotherapy (SBRT) to the two left pulmonary nodules.  Past/Anticipated interventions by medical oncology, if any:  Bronchoscopy? Signs/Symptoms Weight changes, if any: None Respiratory complaints, if any: None Hemoptysis, if any: none Pain issues, if any:  None  SAFETY ISSUES: Prior radiation? None Pacemaker/ICD? None  Possible current pregnancy? Is the patient on methotrexate? None  Current Complaints / other details:   None  BP (!) 168/89 (BP Location: Right Arm, Patient Position: Sitting, Cuff Size: Large)   Pulse 79   Temp 97.6 F (36.4 C)   Resp 20   Ht 5' 3 (1.6 m)   Wt 162 lb 12.8 oz (73.8 kg)   SpO2 96%   BMI 28.84 kg/m   Wt Readings from Last 3 Encounters:  03/27/24 162 lb 12.8 oz (73.8 kg)  03/19/24 164 lb 4.8 oz (74.5 kg)  03/06/24 165 lb 9.6 oz (75.1 kg)

## 2024-03-27 ENCOUNTER — Ambulatory Visit
Admission: RE | Admit: 2024-03-27 | Discharge: 2024-03-27 | Disposition: A | Discharge status: Home / Self Care | Source: Ambulatory Visit | Attending: Radiation Oncology | Admitting: Radiation Oncology

## 2024-03-27 ENCOUNTER — Ambulatory Visit
Admission: RE | Admit: 2024-03-27 | Discharge: 2024-03-27 | Discharge status: Home / Self Care | Source: Ambulatory Visit | Attending: Radiation Oncology | Admitting: Radiation Oncology

## 2024-03-27 ENCOUNTER — Encounter: Payer: Self-pay | Admitting: Radiation Oncology

## 2024-03-27 VITALS — BP 168/89 | HR 79 | Temp 97.6°F | Resp 20 | Ht 63.0 in | Wt 162.8 lb

## 2024-03-27 DIAGNOSIS — I2510 Atherosclerotic heart disease of native coronary artery without angina pectoris: Secondary | ICD-10-CM | POA: Insufficient documentation

## 2024-03-27 DIAGNOSIS — E041 Nontoxic single thyroid nodule: Secondary | ICD-10-CM | POA: Diagnosis not present

## 2024-03-27 DIAGNOSIS — K8020 Calculus of gallbladder without cholecystitis without obstruction: Secondary | ICD-10-CM | POA: Diagnosis not present

## 2024-03-27 DIAGNOSIS — I700 Atherosclerosis of aorta: Secondary | ICD-10-CM | POA: Insufficient documentation

## 2024-03-27 DIAGNOSIS — Z87891 Personal history of nicotine dependence: Secondary | ICD-10-CM | POA: Diagnosis not present

## 2024-03-27 DIAGNOSIS — J449 Chronic obstructive pulmonary disease, unspecified: Secondary | ICD-10-CM | POA: Diagnosis not present

## 2024-03-27 DIAGNOSIS — R911 Solitary pulmonary nodule: Secondary | ICD-10-CM | POA: Insufficient documentation

## 2024-03-27 DIAGNOSIS — Z79899 Other long term (current) drug therapy: Secondary | ICD-10-CM | POA: Insufficient documentation

## 2024-03-27 DIAGNOSIS — C3412 Malignant neoplasm of upper lobe, left bronchus or lung: Secondary | ICD-10-CM

## 2024-03-27 DIAGNOSIS — K5730 Diverticulosis of large intestine without perforation or abscess without bleeding: Secondary | ICD-10-CM | POA: Diagnosis not present

## 2024-03-27 DIAGNOSIS — E559 Vitamin D deficiency, unspecified: Secondary | ICD-10-CM | POA: Diagnosis not present

## 2024-03-27 DIAGNOSIS — E785 Hyperlipidemia, unspecified: Secondary | ICD-10-CM | POA: Diagnosis not present

## 2024-03-27 HISTORY — DX: Malignant neoplasm of unspecified part of unspecified bronchus or lung: C34.90
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
## 2024-03-28 DIAGNOSIS — C3412 Malignant neoplasm of upper lobe, left bronchus or lung: Secondary | ICD-10-CM | POA: Diagnosis not present

## 2024-03-28 DIAGNOSIS — Z87891 Personal history of nicotine dependence: Secondary | ICD-10-CM | POA: Diagnosis not present

## 2024-03-28 NOTE — Addendum Note (Signed)
 Encounter addended by: Izell Domino, MD on: 03/28/2024 8:39 PM  Actions taken: Visit diagnoses modified, Problem List modified, Clinical Note Signed

## 2024-03-30 ENCOUNTER — Encounter: Payer: Self-pay | Admitting: Acute Care

## 2024-03-30 ENCOUNTER — Ambulatory Visit: Admitting: Acute Care

## 2024-03-30 VITALS — BP 160/74 | HR 92 | Temp 98.1°F | Ht 63.0 in | Wt 162.4 lb

## 2024-03-30 DIAGNOSIS — Z87891 Personal history of nicotine dependence: Secondary | ICD-10-CM | POA: Diagnosis not present

## 2024-03-30 DIAGNOSIS — R918 Other nonspecific abnormal finding of lung field: Secondary | ICD-10-CM

## 2024-03-30 NOTE — Patient Instructions (Addendum)
 It is good to see you today. We have reviewed the procedure for bronchoscopy with biopsy of the lung nodules of concern. We reviewed the risks of the procedure to include bleeding, infection, pneumothorax, and adverse reaction to anesthesia. You have been seen by radiation oncology and prefer to go through with the SBRT treatment versus having the bronchoscopy and biopsy and then SBRT if this is a cancer. Follow-up with Dr. Izell as is already scheduled. Radiation oncology will take great care of you. Good luck with your treatment Follow-up with Dr. Darlean April 11, 2024 as a scheduled for COPD. Call if you need us  sooner. Please contact office for sooner follow up if symptoms do not improve or worsen or seek emergency care

## 2024-03-30 NOTE — Progress Notes (Signed)
 History of Present Illness Natalie Smith is a 78 y.o. female former smoker with a 61-pack-year smoking history .  Followed by our clinic for COPD.  Presents today for a lung nodule consult after abnormal low-dose CT scan by PCP.  She is followed by Dr. Darlean for her COPD, she will be followed by Dr. Shelah for her lung nodule.   03/30/2024 Pt. Presents after referral from oncology after LDCT 01/2024 , read as a 4 B, and subsequent PET scan 02/2024 that was concerning for 2 left upper lobe nodules are hypermetabolic and suspicious for malignancy.Referral was for consideration of bronchoscopy with biopsies to determine if the 2 left upper lobe pulmonary nodules are cancer with tissue diagnosis. Pt. Does not want to go through with the biopsy after discussion of risks and benefits. She is a candidate for the procedure, but she was adamant that she did not want to go through with the invasive procedure, when treatment was most likely going to be the same after the procedure.This is the patient's choice. She has seen Dr. Izell who has set her up for simulation 04/06/2024.  Pt. Has follow up with Dr. Darlean 04/11/2024 for her COPD.She states she is having a very stable interval with her COPD at present  Test Results: LDCT Chest 01/2024. Mediastinum/Nodes: Apparent left hemithyroidectomy. No significant right thyroid  nodules. Unremarkable esophagus. No pathologically enlarged axillary, mediastinal or hilar lymph nodes, noting limited sensitivity for the detection of hilar adenopathy on this noncontrast study.   Lungs/Pleura: No pneumothorax. No pleural effusion. Mild centrilobular emphysema with diffuse bronchial wall thickening. No acute consolidative airspace disease or lung masses. Mildly irregular solid posteromedial left upper lobe 9.8 mm pulmonary nodule on image 58, increased from 6.9 mm on 12/21/2022 CT. New central peribronchovascular solid 10.0 mm left upper lobe pulmonary nodule on image 89.    Upper abdomen: No acute abnormality.   Musculoskeletal: No aggressive appearing focal osseous lesions. Mild thoracic spondylosis.   IMPRESSION: 1. Lung-RADS 4B, suspicious. Additional imaging evaluation or consultation with Pulmonology or Thoracic Surgery recommended. Mildly irregular solid posteromedial left upper lobe 9.8 mm pulmonary nodule, increased from 6.9 mm on 12/21/2022 CT. New central peribronchovascular solid 10.0 mm left upper lobe pulmonary nodule. 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 PET-CT suggested for further characterization at this time. 2. One vessel coronary atherosclerosis. 3. Aortic Atherosclerosis (ICD10-I70.0) and Emphysema (ICD10-J43.9).    Latest Ref Rng & Units 03/19/2024    1:03 PM 01/18/2024    9:07 AM 12/29/2023   10:39 AM  CBC  WBC 4.0 - 10.5 K/uL 7.2  6.8  4.4   Hemoglobin 12.0 - 15.0 g/dL 86.3  85.3  85.4   Hematocrit 36.0 - 46.0 % 41.3  45.5  44.4   Platelets 150 - 400 K/uL 276  254.0  237.0        Latest Ref Rng & Units 03/19/2024    1:03 PM 01/18/2024    9:07 AM 12/29/2023   10:39 AM  BMP  Glucose 70 - 99 mg/dL 897  894  89   BUN 8 - 23 mg/dL 9  12  12    Creatinine 0.44 - 1.00 mg/dL 9.39  9.34  9.41   Sodium 135 - 145 mmol/L 136  131  133   Potassium 3.5 - 5.1 mmol/L 3.8  4.3  4.1   Chloride 98 - 111 mmol/L 98  90  95   CO2 22 - 32  mmol/L 33  33  32   Calcium 8.9 - 10.3 mg/dL 8.8  9.5  9.1     BNP    Component Value Date/Time   BNP 72.3 10/03/2018 1355    ProBNP No results found for: PROBNP  PFT    Component Value Date/Time   FEV1PRE 0.86 04/21/2017 1057   FEV1POST 1.00 04/21/2017 1057   FVCPRE 1.37 04/21/2017 1057   FVCPOST 1.66 04/21/2017 1057   TLC 4.83 04/21/2017 1057   DLCOUNC 17.81 04/21/2017 1057   PREFEV1FVCRT 63 04/21/2017 1057   PSTFEV1FVCRT 60 04/21/2017 1057    NM PET Image Initial (PI) Skull Base To Thigh (F-18  FDG) Result Date: 03/01/2024 CLINICAL DATA:  Initial treatment strategy for lung nodule. EXAM: NUCLEAR MEDICINE PET SKULL BASE TO THIGH TECHNIQUE: 8.7 mCi F-18 FDG was injected intravenously. Full-ring PET imaging was performed from the skull base to thigh after the radiotracer. CT data was obtained and used for attenuation correction and anatomic localization. Fasting blood glucose: 112 mg/dl COMPARISON:  94/80/7974 FINDINGS: Mediastinal blood pool activity: SUV max 2.4 Liver activity: SUV max NA NECK: No significant abnormal hypermetabolic activity in this region. Incidental CT findings: Right common carotid atheromatous vascular calcification. CHEST: The more cephalad left upper lobe nodule posteriorly on image 52 series 4 measures 1.0 by 0.7 cm and has a maximum SUV of 3.4, suspicious for malignancy. The peribronchovascular nodule measuring approximately 1.2 by 0.8 cm on image 60 series 4 has a maximum SUV of 4.4, suspicious for malignancy. Incidental CT findings: Coronary, aortic arch, and branch vessel atherosclerotic vascular disease. Emphysema. Hazy airspace opacity posteriorly in the right upper lobe and to a lesser extent in the right middle lobe potentially reflecting early pneumonia. Mild scarring or atelectasis in the lingula and right middle lobe. ABDOMEN/PELVIS: No significant abnormal hypermetabolic activity in this region. Incidental CT findings: Cholelithiasis. Atherosclerosis is present, including aortoiliac atherosclerotic disease. Sigmoid colon diverticulosis. SKELETON: No significant abnormal hypermetabolic activity in this region. Incidental CT findings: None. IMPRESSION: 1. The 2 left upper lobe nodules are hypermetabolic and suspicious for malignancy. 2. No findings of metastatic disease. 3. Hazy new airspace opacity posteriorly in the right upper lobe and to a lesser extent in the right middle lobe potentially reflecting early pneumonia. 4. Cholelithiasis. 5. Sigmoid colon diverticulosis.  6. Aortic Atherosclerosis (ICD10-I70.0) and Emphysema (ICD10-J43.9). Electronically Signed   By: Ryan Salvage M.D.   On: 03/01/2024 15:45     Past medical hx 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   COPD (chronic obstructive pulmonary disease) (HCC)    EPIGASTRIC PAIN 02/19/2010   Gallstones 07/22/2011   GERD (gastroesophageal reflux disease) 08/09/2013   Headache(784.0) 04/21/2007   Hyperlipidemia    HYPERLIPIDEMIA 10/21/2008   LEG PAIN, LEFT 08/20/2009   Lung cancer (HCC)    RASH-NONVESICULAR 06/26/2007   THYROID  DISORDER 04/21/2007   Thyroid  nodule 1978   benign   Vitamin D  deficiency 11/22/2011     Social History   Tobacco Use   Smoking status: Former    Current packs/day: 1.00    Average packs/day: 1 pack/day for 63.2 years (63.2 ttl pk-yrs)    Types: Cigarettes    Start date: 01/12/1961   Smokeless tobacco: Never   Tobacco comments:    last cigarette 2 weeks ago.  Vaping Use   Vaping  status: Never Used  Substance Use Topics   Alcohol use: Yes    Alcohol/week: 0.0 standard drinks of alcohol    Comment: occasionally; vodka on occ   Drug use: No    Natalie Smith reports that she has quit smoking. Her smoking use included cigarettes. She started smoking about 63 years ago. She has a 63.2 pack-year smoking history. She has never used smokeless tobacco. She reports current alcohol use. She reports that she does not use drugs.  Tobacco Cessation: Counseling given: Not Answered Tobacco comments: last cigarette 2 weeks ago. Former smoker, last cigarette was 2 weeks ago, so 03/2024.  Past surgical hx, Family hx, Social hx all reviewed.  Current Outpatient Medications on File Prior to Visit  Medication Sig   acetaminophen  (TYLENOL ) 325 MG tablet Take 325 mg by mouth as needed for moderate  pain.   albuterol  (VENTOLIN  HFA) 108 (90 Base) MCG/ACT inhaler Inhale 1-2 puffs into the lungs every 6 (six) hours as needed for wheezing or shortness of breath.   alendronate  (FOSAMAX ) 70 MG tablet Take 1 tablet (70 mg total) by mouth once a week. Take with a full glass of water on an empty stomach.   amLODipine  (NORVASC ) 5 MG tablet Take 1 tablet (5 mg total) by mouth daily.   amoxicillin -clavulanate (AUGMENTIN ) 875-125 MG tablet Take 1 tablet by mouth 2 (two) times daily.   BREZTRI  AEROSPHERE 160-9-4.8 MCG/ACT AERO inhaler Inhale 2 puffs into the lungs in the morning and at bedtime.   LORazepam  (ATIVAN ) 0.5 MG tablet TAKE 1 TABLET(0.5 MG) BY MOUTH TWICE DAILY AS NEEDED FOR ANXIETY   valsartan -hydrochlorothiazide  (DIOVAN -HCT) 80-12.5 MG tablet TAKE 1 TABLET BY MOUTH DAILY   No current facility-administered medications on file prior to visit.     Allergies  Allergen Reactions   Codeine Nausea And Vomiting   Tramadol  Itching and Nausea And Vomiting    Review Of Systems:  Constitutional:   No  weight loss, night sweats,  Fevers, chills, fatigue, or  lassitude.  HEENT:   No headaches,  Difficulty swallowing,  Tooth/dental problems, or  Sore throat,                No sneezing, itching, ear ache, nasal congestion, post nasal drip,   CV:  No chest pain,  Orthopnea, PND, swelling in lower extremities, anasarca, dizziness, palpitations, syncope.   GI  No heartburn, indigestion, abdominal pain, nausea, vomiting, diarrhea, change in bowel habits, loss of appetite, bloody stools.   Resp: No shortness of breath with exertion or at rest.  No excess mucus, no productive cough,  No non-productive cough,  No coughing up of blood.  No change in color of mucus.  No wheezing.  No chest wall deformity  Skin: no rash or lesions.  GU: no dysuria, change in color of urine, no urgency or frequency.  No flank pain, no hematuria   MS:  No joint pain or swelling.  No decreased range of motion.  No back  pain.  Psych:  No change in mood or affect. No depression or anxiety.  No memory loss.   Vital Signs BP (!) 160/74 (BP Location: Right Arm, Patient Position: Sitting, Cuff Size: Normal)   Pulse 92   Temp 98.1 F (36.7 C) (Oral)   Ht 5' 3 (1.6 m)   Wt 162 lb 6.4 oz (73.7 kg)   SpO2 90%   BMI 28.77 kg/m    Physical Exam:  General- No distress,  A&Ox3, pleasant ENT: No sinus tenderness, TM  clear, pale nasal mucosa, no oral exudate,no post nasal drip, no LAN Cardiac: S1, S2, regular rate and rhythm, no murmur Chest: No wheeze/ rales/ dullness; no accessory muscle use, no nasal flaring, no sternal retractions Abd.: Soft Non-tender, ND, BS +, Body mass index is 28.77 kg/m.  Ext: No clubbing cyanosis, edema, no obvious deformities Neuro:  normal strength, MAE x 4, A&O x 3, appropriate Skin: No rashes, warm and dry, no obvious skin lesions  Psych: normal mood and behavior   Assessment/Plan Lung nodules concerning for lung cancer  Recently quit smoker Positive on PET scan, suspicious for malignancy Seen by radiation oncology for SBRT Plan We have reviewed the procedure for bronchoscopy with biopsy of the lung nodules of concern. We reviewed the risks of the procedure to include bleeding, infection, pneumothorax, and adverse reaction to anesthesia. You have been seen by radiation oncology and prefer to go through with the SBRT treatment versus having the bronchoscopy and biopsy and then SBRT if this is a cancer. Follow-up with Dr. Izell as is already scheduled. Radiation oncology will take great care of you. Good luck with your treatment Follow-up with Dr. Darlean April 11, 2024 as a scheduled for COPD. Call if you need us  sooner. Please contact office for sooner follow up if symptoms do not improve or worsen or seek emergency care     I spent 20 minutes dedicated to the care of this patient on the date of this encounter to include pre-visit review of records, face-to-face time  with the patient discussing conditions above, post visit ordering of testing, clinical documentation with the electronic health record, making appropriate referrals as documented, and communicating necessary information to the patient's healthcare team.   Lauraine JULIANNA Lites, NP 03/30/2024  10:26 AM

## 2024-04-06 ENCOUNTER — Ambulatory Visit
Admission: RE | Admit: 2024-04-06 | Discharge: 2024-04-06 | Disposition: A | Discharge status: Home / Self Care | Source: Ambulatory Visit | Attending: Radiation Oncology | Admitting: Radiation Oncology

## 2024-04-06 DIAGNOSIS — Z87891 Personal history of nicotine dependence: Secondary | ICD-10-CM | POA: Insufficient documentation

## 2024-04-06 DIAGNOSIS — Z510 Encounter for antineoplastic radiation therapy: Secondary | ICD-10-CM | POA: Insufficient documentation

## 2024-04-06 DIAGNOSIS — C3412 Malignant neoplasm of upper lobe, left bronchus or lung: Secondary | ICD-10-CM | POA: Insufficient documentation

## 2024-04-08 NOTE — Progress Notes (Unsigned)
 Natalie Smith, female    DOB: Jan 15, 1946   MRN: [id number]   Brief patient profile:  44  yowf  quit smoking 2022 [name] pt self referred to pulmonary clinic 11/24/2023   for copd eval       History of Present Illness  11/24/2023  Pulmonary/ 1st office eval/Caeson Filippi  Chief Complaint  Patient presents with   Consult  Dyspnea:   likes to walk dog mostly flat surface slow pace x 30 min loop stops at least sev times to catch her breath no better on advair which makes her sick on her stomach Cough: none  Sleep: bed flat with one  pillow  wakes up at usual hour  SABA use:not using any / prefers bronchaid pills which Dr [name] cautioned her against using  02 ldz:wnwz Rec Plan A = Automatic = Always=    Breztri  Take 2 puffs first thing in am and then another 2 puffs about 12 hours later.   Work on inhaler technique: >>>  Remember how golfers warm up by taking practice swings - do this with an empty inhaler  Plan B = Backup (to supplement plan A, not to replace it) Only use your bonchaid as needed  Make sure you check your oxygen  saturation at your highest level of activity(NOT after you stop)  to be sure it stays over 90%          01/18/2024   Eos 0.6  HC03 33  01/18/2024   alpha one AT phenotype MM level 147           04/11/2024  f/u ov/Nichalos Brenton re: GOLD 3 copd / now 0 2 dep hs and prn daytime    maint on breztri    Chief Complaint  Patient presents with   Follow-up    COPD   Dyspnea:  slow walking dog  > 30 min up to twice daily  Cough: slt rattle / getting over cold turning less yellow  Sleeping: flat bed one pillow on 2lpm and no resp cc - no am ha  SABA ldz:zczmb 2-3 days albuterol   once a week bonchaid  02: 2lpm hs  and prn tanks but not using even when anticipates doe/ desats    No obvious day to day or daytime variability or assoc   mucus plugs or hemoptysis or cp or chest tightness, subjective wheeze or overt sinus or hb symptoms.    Also denies any obvious fluctuation of  symptoms with weather or environmental changes or other aggravating or alleviating factors except as outlined above   No unusual exposure hx or h/o childhood pna/ asthma or knowledge of premature birth.  Current Allergies, Complete Past Medical History, Past Surgical History, Family History, and Social History were reviewed in [employer] record.  ROS  The following are not active complaints unless bolded Hoarseness, sore throat, dysphagia, dental problems, itching, sneezing,  nasal congestion or discharge of excess mucus or purulent secretions, ear ache,   fever, chills, sweats, unintended wt loss or wt gain, classically pleuritic or exertional cp,  orthopnea pnd or arm/hand swelling  or leg swelling, presyncope, palpitations, abdominal pain, anorexia, nausea, vomiting, diarrhea  or change in bowel habits or change in bladder habits, change in stools or change in urine, dysuria, hematuria,  rash, arthralgias, visual complaints, headache, numbness, weakness or ataxia or problems with walking or coordination,  change in mood or  memory.        Current Meds  Medication Sig   acetaminophen  (TYLENOL )  325 MG tablet Take 325 mg by mouth as needed for moderate pain.   albuterol  (VENTOLIN  HFA) 108 (90 Base) MCG/ACT inhaler Inhale 1-2 puffs into the lungs every 6 (six) hours as needed for wheezing or shortness of breath.   alendronate  (FOSAMAX ) 70 MG tablet Take 1 tablet (70 mg total) by mouth once a week. Take with a full glass of water on an empty stomach.   amLODipine  (NORVASC ) 5 MG tablet Take 1 tablet (5 mg total) by mouth daily.   BREZTRI  AEROSPHERE 160-9-4.8 MCG/ACT AERO inhaler Inhale 2 puffs into the lungs in the morning and at bedtime.   LORazepam  (ATIVAN ) 0.5 MG tablet TAKE 1 TABLET(0.5 MG) BY MOUTH TWICE DAILY AS NEEDED FOR ANXIETY   valsartan -hydrochlorothiazide  (DIOVAN -HCT) 80-12.5 MG tablet TAKE 1 TABLET BY MOUTH DAILY           Past Medical History:  Diagnosis  Date   Allergic rhinitis    ALLERGIC RHINITIS 03/01/2010   Anxiety    ANXIETY 10/21/2008   CAP (community acquired pneumonia) 10/12/2018   CERVICAL RADICULOPATHY, RIGHT 02/19/2010   COPD (chronic obstructive pulmonary disease) (HCC)    EPIGASTRIC PAIN 02/19/2010   Gallstones 07/22/2011   GERD (gastroesophageal reflux disease) 08/09/2013   Headache(784.0) 04/21/2007   Hyperlipidemia    HYPERLIPIDEMIA 10/21/2008   LEG PAIN, LEFT 08/20/2009   RASH-NONVESICULAR 06/26/2007   THYROID  DISORDER 04/21/2007   Thyroid  nodule 1978   benign   Vitamin D  deficiency 11/22/2011      Objective:    Wts  04/11/2024          161   01/18/24 166 lb 12.8 oz (75.7 kg)  12/29/23 165 lb (74.8 kg)  11/24/23 164 lb 9.6 oz (74.7 kg)    Vital signs reviewed  04/11/2024  - Note at rest 02 sats  88% on RA   General appearance:    amb pleasant wf nad   HEENT : Oropharynx  clear/ full dentures   Nasal turbinates nl    NECK :  without  apparent JVD/ palpable Nodes/TM    LUNGS: no acc muscle use,  Mild barrel  contour chest wall with bilateral  Distant exp sonorous rhonchi and  without cough on insp or exp maneuvers  and mild  Hyperresonant  to  percussion bilaterally     CV:  RRR  no s3 or murmur or increase in P2, and no edema   ABD:  soft and nontender with pos end  insp Hoover's  in the supine position.  No bruits or organomegaly appreciated   MS:  Nl gait/ ext warm without deformities Or obvious joint restrictions  calf tenderness, cyanosis or clubbing     SKIN: warm and dry without lesions    NEURO:  alert, approp, nl sensorium with  no motor or cerebellar deficits apparent.         Assessment       Assessment & Plan COPD   GOLD 3 Stopped smoking 2022/MM Eos 0.1  11/02/22  11/24/2023   Walked on RA  x  2  lap(s) =  approx 500 ft  @ avg pace, stopped due to end of study  with lowest 02 sats 94%  PFT's  05/01/17  FEV1 1.00 (48 % ) ratio 0.60  p 15 % improvement from saba p 0 prior to  study with DLCO  17.8 (82%)   and FV curve mildly concave   11/24/2023  After extensive coaching inhaler device,  effectiveness =  60% with hfa (short Ti) > breztri  trial  01/18/2024  After extensive coaching inhaler device,  effectiveness =    70% with hfa  (short ti) on breztri  but raspy so try bevespi . 01/18/2024   Walked on RA  x  3  lap(s) =  approx 750  ft  @ nl pace, stopped due to end of study with lowest 02 sats 91%/ mild sob   01/18/2024   Eos 0.6  HC03 33  01/18/2024   alpha one AT phenotype MM level 147  04/11/2024  After extensive coaching inhaler device,  effectiveness =    75% from a baseline of 25%     Group D (now reclassified as E) in terms of symptom/risk and laba/lama/ICS  therefore appropriate rx at this point >>>  Breztri  and appprop saba  Re SABA :  I spent extra time with pt today reviewing appropriate use of albuterol  for prn use on exertion with the following points: 1) saba is for relief of sob that does not improve by walking a slower pace or resting but rather if the pt does not improve after trying this first. 2) If the pt is convinced, as many are, that saba helps recover from activity faster then it's easy to tell if this is the case by re-challenging : ie stop, take the inhaler, then p 5 minutes try the exact same activity (intensity of workload) that just caused the symptoms and see if they are substantially diminished or not after saba 3) if there is an activity that reproducibly causes the symptoms, try the saba 15 min before the activity on alternate days   If in fact the saba really does help, then fine to continue to use it prn but advised may need to look closer at the maintenance regimen (Breztri  for now)  being used to achieve better control of airways disease with exertion.   Depomedrol 120 mg IM today and add prednisone  as Plan D if not back to baseline   For cough/ congestion > mucinex or mucinex dm  up to maximum of  1200 mg every 12 hours and add  flutter  valve if needed next       Each maintenance medication was reviewed in detail including emphasizing most importantly the difference between maintenance and prns and under what circumstances the prns are to be triggered using an action plan format where appropriate.  Total time for H and P, chart review, counseling, reviewing hfa/ neb  device(s) and generating customized AVS unique to this office visit / same day charting = 32  min      Chronic respiratory failure with hypoxia (HCC) 03/20/23  SATURATION QUALIFICATIONS: (This note is used to comply with regulatory documentation for home oxygen ) Patient Saturations on Room Air at Rest = 90% Patient Saturations on Room Air while Ambulating = 80% Patient Saturations on 2 Liters of oxygen  while Ambulating = 90% Please briefly explain why patient needs home oxygen : There are no other means to increase oxygen  levels.  Presenly on 2lpm hs but needs to titrate approp for daytime use:  REC : Make sure you check your oxygen  saturation  AT  your highest level of activity (not after you stop)   to be sure it stays over 90% and adjust  02 flow upward to maintain this level if needed but remember to turn it back to previous settings when you stop (to conserve your supply).   F/u in 3 m

## 2024-04-11 ENCOUNTER — Encounter: Payer: Self-pay | Admitting: Internal Medicine

## 2024-04-11 ENCOUNTER — Ambulatory Visit: Admitting: Internal Medicine

## 2024-04-11 VITALS — BP 154/79 | HR 70 | Ht 63.0 in | Wt 161.0 lb

## 2024-04-11 DIAGNOSIS — J449 Chronic obstructive pulmonary disease, unspecified: Secondary | ICD-10-CM | POA: Diagnosis not present

## 2024-04-11 DIAGNOSIS — J9611 Chronic respiratory failure with hypoxia: Secondary | ICD-10-CM

## 2024-04-11 MED ORDER — METHYLPREDNISOLONE ACETATE 80 MG/ML IJ SUSP
120.0000 mg | Freq: Once | INTRAMUSCULAR | Status: AC
  Administered 2024-04-11: 120 mg via INTRAMUSCULAR

## 2024-04-11 MED ORDER — BREZTRI AEROSPHERE 160-9-4.8 MCG/ACT IN AERO: 2.0000 | INHALATION_SPRAY | Freq: Two times a day (BID) | RESPIRATORY_TRACT | Status: DC

## 2024-04-11 NOTE — Patient Instructions (Addendum)
 Plan A = Automatic = Always=    Breztri  Take 2 puffs first thing in am and then another 2 puffs about 12 hours later.    Work on inhaler technique:  relax and gently blow all the way out then take a nice smooth full deep breath back in, triggering the inhaler at same time you start breathing in.  Hold breath in for at least  5 seconds if you can. Blow out breeztri  thru nose. Rinse and gargle with water when done.  If mouth or throat bother you at all,  try brushing teeth/gums/tongue with arm and hammer toothpaste/ make a slurry and gargle and spit out.       Plan B = Backup (to supplement plan A, not to replace it) Use your albuterol  inhaler as a rescue medication to be used if you can't catch your breath by resting or slowing your pace  or doing a relaxed purse lip breathing pattern.  - The less you use it, the better it will work when you need it. - Ok to use the inhaler up to 2 puffs  every 4 hours if you must but call for appointment if use goes up over your usual need - Don't leave home without it !!  (think of it like the spare tire or starter fluid for your car)   Plan C =  add bronchaid if albuterol  doesn' t help   Plan D = depomedrol 120 mg or we can call you in some prednisone   For cough/ congestion > mucinex or mucinex dm  up to maximum of  1200 mg every 12 hours as needed   Make sure you check your oxygen  saturation  AT  your highest level of activity (not after you stop)   to be sure it stays over 90% and adjust  02 flow upward to maintain this level if needed but remember to turn it back to previous settings when you stop (to conserve your supply).    Please schedule a follow up visit in 3 months but call sooner if needed

## 2024-04-11 NOTE — Assessment & Plan Note (Addendum)
 03/20/23  SATURATION QUALIFICATIONS: (This note is used to comply with regulatory documentation for home oxygen ) Patient Saturations on Room Air at Rest = 90% Patient Saturations on Room Air while Ambulating = 80% Patient Saturations on 2 Liters of oxygen  while Ambulating = 90% Please briefly explain why patient needs home oxygen : There are no other means to increase oxygen  levels.  Presenly on 2lpm hs but needs to titrate approp for daytime use:  REC : Make sure you check your oxygen  saturation  AT  your highest level of activity (not after you stop)   to be sure it stays over 90% and adjust  02 flow upward to maintain this level if needed but remember to turn it back to previous settings when you stop (to conserve your supply).   F/u in 3 m

## 2024-04-11 NOTE — Assessment & Plan Note (Addendum)
 Stopped smoking 2022/MM Eos 0.1  11/02/22  11/24/2023   Walked on RA  x  2  lap(s) =  approx 500 ft  @ avg pace, stopped due to end of study  with lowest 02 sats 94%  PFT's  05/01/17  FEV1 1.00 (48 % ) ratio 0.60  p 15 % improvement from saba p 0 prior to study with DLCO  17.8 (82%)   and FV curve mildly concave   11/24/2023  After extensive coaching inhaler device,  effectiveness =    60% with hfa (short Ti) > breztri  trial  01/18/2024  After extensive coaching inhaler device,  effectiveness =    70% with hfa  (short ti) on breztri  but raspy so try bevespi . 01/18/2024   Walked on RA  x  3  lap(s) =  approx 750  ft  @ nl pace, stopped due to end of study with lowest 02 sats 91%/ mild sob   01/18/2024   Eos 0.6  HC03 33  01/18/2024   alpha one AT phenotype MM level 147  04/11/2024  After extensive coaching inhaler device,  effectiveness =    75% from a baseline of 25%     Group D (now reclassified as E) in terms of symptom/risk and laba/lama/ICS  therefore appropriate rx at this point >>>  Breztri  and appprop saba  Re SABA :  I spent extra time with pt today reviewing appropriate use of albuterol  for prn use on exertion with the following points: 1) saba is for relief of sob that does not improve by walking a slower pace or resting but rather if the pt does not improve after trying this first. 2) If the pt is convinced, as many are, that saba helps recover from activity faster then it's easy to tell if this is the case by re-challenging : ie stop, take the inhaler, then p 5 minutes try the exact same activity (intensity of workload) that just caused the symptoms and see if they are substantially diminished or not after saba 3) if there is an activity that reproducibly causes the symptoms, try the saba 15 min before the activity on alternate days   If in fact the saba really does help, then fine to continue to use it prn but advised may need to look closer at the maintenance regimen (Breztri  for now)  being  used to achieve better control of airways disease with exertion.   Depomedrol 120 mg IM today and add prednisone  as Plan D if not back to baseline   For cough/ congestion > mucinex or mucinex dm  up to maximum of  1200 mg every 12 hours and add  flutter valve if needed next       Each maintenance medication was reviewed in detail including emphasizing most importantly the difference between maintenance and prns and under what circumstances the prns are to be triggered using an action plan format where appropriate.  Total time for H and P, chart review, counseling, reviewing hfa/ neb  device(s) and generating customized AVS unique to this office visit / same day charting = 32  min

## 2024-04-16 DIAGNOSIS — Z87891 Personal history of nicotine dependence: Secondary | ICD-10-CM | POA: Diagnosis not present

## 2024-04-16 DIAGNOSIS — C3412 Malignant neoplasm of upper lobe, left bronchus or lung: Secondary | ICD-10-CM | POA: Diagnosis not present

## 2024-04-16 DIAGNOSIS — Z510 Encounter for antineoplastic radiation therapy: Secondary | ICD-10-CM | POA: Diagnosis not present

## 2024-04-18 ENCOUNTER — Other Ambulatory Visit: Payer: Self-pay

## 2024-04-18 ENCOUNTER — Ambulatory Visit
Admission: RE | Admit: 2024-04-18 | Discharge: 2024-04-18 | Disposition: A | Discharge status: Home / Self Care | Source: Ambulatory Visit | Attending: Radiation Oncology | Admitting: Radiation Oncology

## 2024-04-18 DIAGNOSIS — Z510 Encounter for antineoplastic radiation therapy: Secondary | ICD-10-CM | POA: Diagnosis not present

## 2024-04-18 DIAGNOSIS — Z87891 Personal history of nicotine dependence: Secondary | ICD-10-CM | POA: Diagnosis not present

## 2024-04-18 DIAGNOSIS — C3412 Malignant neoplasm of upper lobe, left bronchus or lung: Secondary | ICD-10-CM | POA: Diagnosis not present

## 2024-04-18 LAB — RAD ONC ARIA SESSION SUMMARY
Course Elapsed Days: 0
Plan Fractions Treated to Date: 1
Plan Prescribed Dose Per Fraction: 12 Gy
Plan Total Fractions Prescribed: 5
Plan Total Prescribed Dose: 60 Gy
Reference Point Dosage Given to Date: 12 Gy
Reference Point Session Dosage Given: 12 Gy
Session Number: 1

## 2024-04-20 ENCOUNTER — Other Ambulatory Visit: Payer: Self-pay

## 2024-04-20 ENCOUNTER — Ambulatory Visit
Admission: RE | Admit: 2024-04-20 | Discharge: 2024-04-20 | Disposition: A | Discharge status: Home / Self Care | Source: Ambulatory Visit | Attending: Radiation Oncology | Admitting: Radiation Oncology

## 2024-04-20 DIAGNOSIS — C3412 Malignant neoplasm of upper lobe, left bronchus or lung: Secondary | ICD-10-CM | POA: Diagnosis not present

## 2024-04-20 DIAGNOSIS — Z510 Encounter for antineoplastic radiation therapy: Secondary | ICD-10-CM | POA: Diagnosis not present

## 2024-04-20 DIAGNOSIS — Z87891 Personal history of nicotine dependence: Secondary | ICD-10-CM | POA: Diagnosis not present

## 2024-04-20 LAB — RAD ONC ARIA SESSION SUMMARY
Course Elapsed Days: 2
Plan Fractions Treated to Date: 2
Plan Prescribed Dose Per Fraction: 12 Gy
Plan Total Fractions Prescribed: 5
Plan Total Prescribed Dose: 60 Gy
Reference Point Dosage Given to Date: 24 Gy
Reference Point Session Dosage Given: 12 Gy
Session Number: 2

## 2024-04-23 ENCOUNTER — Ambulatory Visit
Admission: RE | Admit: 2024-04-23 | Discharge: 2024-04-23 | Disposition: A | Discharge status: Home / Self Care | Source: Ambulatory Visit | Attending: Radiation Oncology | Admitting: Radiation Oncology

## 2024-04-23 ENCOUNTER — Other Ambulatory Visit: Payer: Self-pay

## 2024-04-23 DIAGNOSIS — Z87891 Personal history of nicotine dependence: Secondary | ICD-10-CM | POA: Diagnosis not present

## 2024-04-23 DIAGNOSIS — Z510 Encounter for antineoplastic radiation therapy: Secondary | ICD-10-CM | POA: Diagnosis not present

## 2024-04-23 DIAGNOSIS — C3412 Malignant neoplasm of upper lobe, left bronchus or lung: Secondary | ICD-10-CM | POA: Diagnosis not present

## 2024-04-23 LAB — RAD ONC ARIA SESSION SUMMARY
Course Elapsed Days: 5
Plan Fractions Treated to Date: 3
Plan Prescribed Dose Per Fraction: 12 Gy
Plan Total Fractions Prescribed: 5
Plan Total Prescribed Dose: 60 Gy
Reference Point Dosage Given to Date: 36 Gy
Reference Point Session Dosage Given: 12 Gy
Session Number: 3
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
## 2024-04-25 ENCOUNTER — Inpatient Hospital Stay

## 2024-04-25 ENCOUNTER — Inpatient Hospital Stay: Admitting: Physician Assistant

## 2024-04-25 ENCOUNTER — Other Ambulatory Visit: Payer: Self-pay

## 2024-04-25 ENCOUNTER — Ambulatory Visit
Admission: RE | Admit: 2024-04-25 | Discharge: 2024-04-25 | Disposition: A | Discharge status: Home / Self Care | Source: Ambulatory Visit | Attending: Radiation Oncology | Admitting: Radiation Oncology

## 2024-04-25 DIAGNOSIS — C3412 Malignant neoplasm of upper lobe, left bronchus or lung: Secondary | ICD-10-CM | POA: Diagnosis not present

## 2024-04-25 DIAGNOSIS — Z87891 Personal history of nicotine dependence: Secondary | ICD-10-CM | POA: Diagnosis not present

## 2024-04-25 DIAGNOSIS — Z510 Encounter for antineoplastic radiation therapy: Secondary | ICD-10-CM | POA: Diagnosis not present

## 2024-04-25 LAB — RAD ONC ARIA SESSION SUMMARY
Course Elapsed Days: 7
Plan Fractions Treated to Date: 4
Plan Prescribed Dose Per Fraction: 12 Gy
Plan Total Fractions Prescribed: 5
Plan Total Prescribed Dose: 60 Gy
Reference Point Dosage Given to Date: 48 Gy
Reference Point Session Dosage Given: 12 Gy
Session Number: 4

## 2024-04-26 ENCOUNTER — Inpatient Hospital Stay: Attending: Internal Medicine

## 2024-04-26 ENCOUNTER — Inpatient Hospital Stay (HOSPITAL_BASED_OUTPATIENT_CLINIC_OR_DEPARTMENT_OTHER): Admitting: Physician Assistant

## 2024-04-26 VITALS — BP 125/70 | HR 77 | Temp 97.9°F | Resp 16 | Ht 63.0 in | Wt 159.5 lb

## 2024-04-26 DIAGNOSIS — Z87891 Personal history of nicotine dependence: Secondary | ICD-10-CM | POA: Insufficient documentation

## 2024-04-26 DIAGNOSIS — R059 Cough, unspecified: Secondary | ICD-10-CM | POA: Insufficient documentation

## 2024-04-26 DIAGNOSIS — Z79899 Other long term (current) drug therapy: Secondary | ICD-10-CM | POA: Insufficient documentation

## 2024-04-26 DIAGNOSIS — R918 Other nonspecific abnormal finding of lung field: Secondary | ICD-10-CM | POA: Diagnosis not present

## 2024-04-26 DIAGNOSIS — R911 Solitary pulmonary nodule: Secondary | ICD-10-CM

## 2024-04-26 DIAGNOSIS — C3412 Malignant neoplasm of upper lobe, left bronchus or lung: Secondary | ICD-10-CM

## 2024-04-26 LAB — CMP (CANCER CENTER ONLY)
ALT: 18 U/L (ref 0–44)
AST: 16 U/L (ref 15–41)
Albumin: 4.1 g/dL (ref 3.5–5.0)
Alkaline Phosphatase: 56 U/L (ref 38–126)
Anion gap: 3 — ABNORMAL LOW (ref 5–15)
BUN: 18 mg/dL (ref 8–23)
CO2: 36 mmol/L — ABNORMAL HIGH (ref 22–32)
Calcium: 9 mg/dL (ref 8.9–10.3)
Chloride: 96 mmol/L — ABNORMAL LOW (ref 98–111)
Creatinine: 0.53 mg/dL (ref 0.44–1.00)
GFR, Estimated: >60 mL/min (ref >60)
Glucose, Bld: 93 mg/dL (ref 70–99)
Potassium: 3.9 mmol/L (ref 3.5–5.1)
Sodium: 135 mmol/L (ref 135–145)
Total Bilirubin: 0.8 mg/dL (ref 0.0–1.2)
Total Protein: 6.8 g/dL (ref 6.5–8.1)

## 2024-04-26 LAB — CBC WITH DIFFERENTIAL (CANCER CENTER ONLY)
Abs Immature Granulocytes: 0.03 K/uL (ref 0.00–0.07)
Basophils Absolute: 0 K/uL (ref 0.0–0.1)
Basophils Relative: 0 %
Eosinophils Absolute: 0 K/uL (ref 0.0–0.5)
Eosinophils Relative: 1 %
HCT: 42.4 % (ref 36.0–46.0)
Hemoglobin: 13.6 g/dL (ref 12.0–15.0)
Immature Granulocytes: 1 %
Lymphocytes Relative: 10 %
Lymphs Abs: 0.6 K/uL — ABNORMAL LOW (ref 0.7–4.0)
MCH: 30.8 pg (ref 26.0–34.0)
MCHC: 32.1 g/dL (ref 30.0–36.0)
MCV: 95.9 fL (ref 80.0–100.0)
Monocytes Absolute: 0.4 K/uL (ref 0.1–1.0)
Monocytes Relative: 7 %
Neutro Abs: 4.8 K/uL (ref 1.7–7.7)
Neutrophils Relative %: 81 %
Platelet Count: 211 K/uL (ref 150–400)
RBC: 4.42 MIL/uL (ref 3.87–5.11)
RDW: 12.3 % (ref 11.5–15.5)
WBC Count: 5.8 K/uL (ref 4.0–10.5)
nRBC: 0 % (ref 0.0–0.2)

## 2024-04-27 ENCOUNTER — Ambulatory Visit
Admission: RE | Admit: 2024-04-27 | Discharge: 2024-04-27 | Disposition: A | Discharge status: Home / Self Care | Source: Ambulatory Visit | Attending: Radiation Oncology | Admitting: Radiation Oncology

## 2024-04-27 ENCOUNTER — Other Ambulatory Visit: Payer: Self-pay

## 2024-04-27 DIAGNOSIS — C3412 Malignant neoplasm of upper lobe, left bronchus or lung: Secondary | ICD-10-CM | POA: Diagnosis not present

## 2024-04-27 DIAGNOSIS — Z510 Encounter for antineoplastic radiation therapy: Secondary | ICD-10-CM | POA: Diagnosis not present

## 2024-04-27 DIAGNOSIS — Z87891 Personal history of nicotine dependence: Secondary | ICD-10-CM | POA: Diagnosis not present

## 2024-04-27 LAB — RAD ONC ARIA SESSION SUMMARY
Course Elapsed Days: 9
Plan Fractions Treated to Date: 5
Plan Prescribed Dose Per Fraction: 12 Gy
Plan Total Fractions Prescribed: 5
Plan Total Prescribed Dose: 60 Gy
Reference Point Dosage Given to Date: 60 Gy
Reference Point Session Dosage Given: 12 Gy
Session Number: 5

## 2024-04-30 NOTE — Radiation Completion Notes (Signed)
 Patient Name: Natalie Smith, Natalie Smith MRN: [id number] Date of Birth: [birth year] Referring Physician: SHERROD SHERROD, M.D. Date of Service: 2024-04-30 Radiation Oncologist: Lauraine Golden, M.D. 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, left bronchus or lung Intent: Curative     ==========DELIVERED PLANS==========  First Treatment Date: 2024-04-18 Last Treatment Date: 2024-04-27   Plan Name: Lung_L_SBRT Site: Lung, Left Technique: SBRT/SRT-IMRT Mode: Photon Dose Per Fraction: 12 Gy Prescribed Dose (Delivered / Prescribed): 60 Gy / 60 Gy Prescribed Fxs (Delivered / Prescribed): 5 / 5     ==========ON TREATMENT VISIT DATES========== 2024-04-18, 2024-04-20, 2024-04-23, 2024-04-23, 2024-04-25, 2024-04-27     ==========UPCOMING VISITS==========       ==========APPENDIX - ON TREATMENT VISIT NOTES==========   See weekly On Treatment Notes in Epic for details in the Media tab (listed as Progress notes on the On Treatment Visit Dates listed above).

## 2024-05-02 ENCOUNTER — Other Ambulatory Visit: Payer: Self-pay

## 2024-05-02 DIAGNOSIS — C3412 Malignant neoplasm of upper lobe, left bronchus or lung: Secondary | ICD-10-CM

## 2024-05-15 ENCOUNTER — Ambulatory Visit: Payer: Self-pay

## 2024-05-15 NOTE — Telephone Encounter (Signed)
 Copied from CRM #[id number]. Topic: Clinical - Medication Question >> May 15, 2024 11:08 AM Mia F wrote: Reason for CRM: Pt says she was doing yard work and pulled a muscle and would like to know if she could get a medication for it. Pt says she has done this before and was diagnosed with a strained flank. She says she was given something for this about a year ago and has done the exact same thing. She does not want to be seen because she know what had happened. Please send to [facility] [place], [place] - [address] [facility] [address] [place] [address] Phone: [phone] Fax: [phone]  Please advise.  Call placed-no answer by patient but voicemail left for patient to call back to Nurse Triage. Will place in callbacks.

## 2024-05-15 NOTE — Telephone Encounter (Signed)
 2nd attempt to contact patient-no answer-voicemail left for patient to call back to Nurse triage. Will place in call backs.

## 2024-05-15 NOTE — Telephone Encounter (Signed)
 Per workflow routing to clinic for follow up to no contact X 3 attempts.  Reason for Disposition . Third attempt to contact caller AND no contact made. Phone number verified. . Message left on identified voice mail  Protocols used: No Contact or Duplicate Contact Call-A-AH

## 2024-05-16 NOTE — Telephone Encounter (Signed)
 Patient is aware.

## 2024-05-17 ENCOUNTER — Encounter: Payer: Self-pay | Admitting: Internal Medicine

## 2024-05-17 ENCOUNTER — Ambulatory Visit: Admitting: Internal Medicine

## 2024-05-17 VITALS — BP 110/64 | HR 76 | Temp 97.5°F | Wt 158.3 lb

## 2024-05-17 DIAGNOSIS — M545 Low back pain, unspecified: Secondary | ICD-10-CM | POA: Diagnosis not present

## 2024-05-17 MED ORDER — DICLOFENAC SODIUM 75 MG PO TBEC: 75.0000 mg | DELAYED_RELEASE_TABLET | Freq: Two times a day (BID) | ORAL | 0 refills | Status: AC
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
## 2024-06-18 ENCOUNTER — Telehealth: Payer: Self-pay | Admitting: *Deleted

## 2024-06-18 ENCOUNTER — Other Ambulatory Visit: Payer: Self-pay | Admitting: Internal Medicine

## 2024-06-18 DIAGNOSIS — F419 Anxiety disorder, unspecified: Secondary | ICD-10-CM

## 2024-06-18 NOTE — Telephone Encounter (Signed)
 Called patient to inform of Ct for 07-30-24- arrival time- 10:45 am @ [facility], no restrictions to scan, patient to receive results from [facility] on 08-07-24 @ 1:30 pm, lvm for a return call

## 2024-06-19 ENCOUNTER — Telehealth: Payer: Self-pay | Admitting: *Deleted

## 2024-06-19 NOTE — Telephone Encounter (Signed)
 RETURNED PATIENT'S PHONE CALL, LVM FOR A RETURN CALL

## 2024-06-25 ENCOUNTER — Other Ambulatory Visit: Payer: Self-pay | Admitting: Adult Health

## 2024-06-25 DIAGNOSIS — J449 Chronic obstructive pulmonary disease, unspecified: Secondary | ICD-10-CM

## 2024-07-11 ENCOUNTER — Other Ambulatory Visit: Payer: Self-pay | Admitting: Internal Medicine

## 2024-07-11 DIAGNOSIS — F419 Anxiety disorder, unspecified: Secondary | ICD-10-CM

## 2024-07-30 ENCOUNTER — Encounter (HOSPITAL_COMMUNITY): Payer: Self-pay

## 2024-07-30 ENCOUNTER — Ambulatory Visit (HOSPITAL_COMMUNITY)
Admission: RE | Admit: 2024-07-30 | Discharge: 2024-07-30 | Disposition: A | Discharge status: Home / Self Care | Source: Ambulatory Visit | Attending: Radiology | Admitting: Radiology

## 2024-07-30 DIAGNOSIS — C3412 Malignant neoplasm of upper lobe, left bronchus or lung: Secondary | ICD-10-CM | POA: Diagnosis present

## 2024-07-31 ENCOUNTER — Ambulatory Visit: Admitting: Internal Medicine

## 2024-07-31 NOTE — Progress Notes (Signed)
 Radiation Oncology         [phone] ________________________________  Name: Natalie Smith MRN: [id number]  Date: 08/07/2024  DOB: August 09, 1946  Follow-Up Visit Note  Outpatient  CC: Theophilus Andrews, Tully GRADE, MD  Theophilus Andrews, Estel*  Diagnosis and Prior Radiotherapy:    ICD-10-CM   1. Cancer of upper lobe of left lung [facility])  C34.12 CT CHEST WO CONTRAST      ==========DELIVERED PLANS==========  First Treatment Date: 2024-04-18 Last Treatment Date: 2024-04-27   Plan Name: Lung_L_SBRT Site: Lung, Left Technique: SBRT/SRT-IMRT Mode: Photon Dose Per Fraction: 12 Gy Prescribed Dose (Delivered / Prescribed): 60 Gy / 60 Gy Prescribed Fxs (Delivered / Prescribed): 5 / 5   Putative synchronous primary stage IA1 (cT1a, N0, M0) NSCLC of the LUL; s/p SBRT completed on 04/27/2024  CHIEF COMPLAINT: Here for follow-up and surveillance of NSCLC and to review recent imaging.   Narrative:  The patient returns today for routine follow-up and to review most recent imaging.   CT of the chest on 07/31/2024 demonstrates interval response to therapy with no evidence of disease recurrence of metastases.   Patient reports to be doing well overall today. She notes shortness of breath with exertion, but denies any cough, chest pain, or any changes to her breathing since we last saw her. She complains of a one-year history of mid-back pain. Pain feels random and she has been unable to identify aggravating factors.                             ALLERGIES:  is allergic to codeine and tramadol .  Meds: Current Outpatient Medications  Medication Sig Dispense Refill   acetaminophen  (TYLENOL ) 325 MG tablet Take 325 mg by mouth as needed for moderate pain.     albuterol  (VENTOLIN  HFA) 108 (90 Base) MCG/ACT inhaler Inhale 1-2 puffs into the lungs every 6 (six) hours as needed for wheezing or shortness of breath. 8.5 g 2   alendronate  (FOSAMAX ) 70 MG tablet Take 1 tablet (70 mg total) by mouth once a  week. Take with a full glass of water on an empty stomach. 12 tablet 3   amLODipine  (NORVASC ) 5 MG tablet Take 1 tablet (5 mg total) by mouth daily. 90 tablet 1   amoxicillin -clavulanate (AUGMENTIN ) 875-125 MG tablet Take 1 tablet by mouth 2 (two) times daily. 20 tablet 0   BREZTRI  AEROSPHERE 160-9-4.8 MCG/ACT AERO inhaler Inhale 2 puffs into the lungs in the morning and at bedtime.     budesonide -glycopyrrolate-formoterol  (BREZTRI  AEROSPHERE) 160-9-4.8 MCG/ACT AERO inhaler Inhale 2 puffs into the lungs in the morning and at bedtime.     diclofenac  (VOLTAREN ) 75 MG EC tablet Take 1 tablet (75 mg total) by mouth 2 (two) times daily. 60 tablet 0   LORazepam  (ATIVAN ) 0.5 MG tablet Take 1 tablet (0.5 mg total) by mouth 2 (two) times daily as needed for anxiety. 45 tablet 0   valsartan -hydrochlorothiazide  (DIOVAN -HCT) 80-12.5 MG tablet Take 1 tablet by mouth daily. 90 tablet 0   No current facility-administered medications for this encounter.    Physical Findings: The patient is in no acute distress. Patient is alert and oriented.  height is 5' 3 (1.6 m) and weight is 155 lb 12.8 oz (70.7 kg). Her temperature is 97.8 F (36.6 C). Her blood pressure is 146/71 (abnormal) and her pulse is 83. Her respiration is 18 and oxygen  saturation is 97%. .    General: Alert and oriented,  in no acute distress HEENT: Head is normocephalic. Extraocular movements are intact.  Neck: Neck is supple, no palpable cervical or supraclavicular lymphadenopathy. Heart: Regular in rate and rhythm with no murmurs, rubs, or gallops. Chest: Clear to auscultation bilaterally, with no rhonchi, wheezes, or rales. Extremities: No cyanosis or edema. Lymphatics: see Neck Exam Skin: No concerning lesions. Musculoskeletal: symmetric strength and muscle tone throughout. Neurologic: Cranial nerves II through XII are grossly intact. No obvious focalities. Speech is fluent. Coordination is intact. Psychiatric: Judgment and insight are  intact. Affect is appropriate.    Lab Findings: Lab Results  Component Value Date   WBC 5.8 04/26/2024   HGB 13.6 04/26/2024   HCT 42.4 04/26/2024   MCV 95.9 04/26/2024   PLT 211 04/26/2024    Radiographic Findings: CT Chest Wo Contrast Result Date: 08/06/2024 CLINICAL DATA:  Non-small cell lung cancer, radiation therapy completed October 2025. Esophagitis. * Tracking Code: BO * EXAM: CT CHEST WITHOUT CONTRAST TECHNIQUE: Multidetector CT imaging of the chest was performed following the standard protocol without IV contrast. RADIATION DOSE REDUCTION: This exam was performed according to the departmental dose-optimization program which includes automated exposure control, adjustment of the mA and/or kV according to patient size and/or use of iterative reconstruction technique. COMPARISON:  PET 03/01/2024 and CT chest 01/23/2024. FINDINGS: Cardiovascular: Atherosclerotic calcification of the aorta, aortic valve and coronary arteries. Heart is at the upper limits of normal in size to mildly enlarged. No pericardial effusion. Mediastinum/Nodes: No pathologically enlarged mediastinal or axillary lymph nodes. Hilar regions are difficult to definitively evaluate without IV contrast. Esophagus is grossly unremarkable. Lungs/Pleura: Centrilobular emphysema. Calcified granulomas. Scattered pulmonary parenchymal scarring. There is patchy ground-glass in the medial and posterior left upper lobe, new from 01/23/2024. Previously seen apical left upper lobe nodule measures 3 mm (6/24), previously 9 mm on 01/23/2024. Previously seen 10 mm nodule in the medial left upper lobe now measures approximately 7 mm (6/33). No pleural fluid. Airway is unremarkable. Upper Abdomen: Liver margin is slightly irregular. Probable tiny cyst in the right hepatic lobe. Gallstone. Small left adrenal adenoma. No specific follow-up necessary. Visualized portions of the liver, gallbladder, adrenal glands, kidneys, spleen, pancreas, stomach  and bowel are otherwise grossly unremarkable. No upper abdominal adenopathy. Musculoskeletal: Degenerative changes in the spine. Slight compression of upper and midthoracic vertebral bodies, unchanged. No worrisome lytic or sclerotic lesions. IMPRESSION: 1. Interval response to therapy as evidenced by decrease in size of 2 left upper lobe pulmonary nodules, with surrounding changes of radiation therapy. 2. Cirrhosis. 3. Cholelithiasis. 4. Small left adrenal adenoma. 5. Aortic atherosclerosis (ICD10-I70.0). Coronary artery calcification. 6.  Emphysema (ICD10-J43.9). Electronically Signed   By: Newell Eke M.D.   On: 08/06/2024 08:47    Impression/Plan:  Putative synchronous primary stage IA1 (cT1a, N0, M0) NSCLC of the LUL; s/p SBRT completed on 04/27/2024  Ms. Service is doing well overall today. We personally reviewed this patient's imaging. Most recent CT demonstrates no evidence of disease recurrence or metastasis. No etiology for back pain was appreciated.  Per NCCN guidelines, we will proceed with serial CT scans every 6 months.   I have reached out to patient's PCP to share incidental cirrhosis finding and patient's complaint of chronic back pain.    On date of service, in total, I spent 20 minutes on this encounter. Patient was seen in person.  _____________________________________    Leeroy Due, PA-C

## 2024-08-06 ENCOUNTER — Other Ambulatory Visit: Payer: Self-pay | Admitting: Internal Medicine

## 2024-08-06 DIAGNOSIS — I10 Essential (primary) hypertension: Secondary | ICD-10-CM

## 2024-08-06 DIAGNOSIS — F419 Anxiety disorder, unspecified: Secondary | ICD-10-CM

## 2024-08-06 NOTE — Telephone Encounter (Unsigned)
 Copied from CRM #[id number]. Topic: Clinical - Medication Refill >> Aug 06, 2024  7:58 AM Eva FALCON wrote: Medication:  LORazepam  (ATIVAN ) 0.5 MG tablet  valsartan -hydrochlorothiazide  (DIOVAN -HCT) 80-12.5 MG tablet amLODipine  (NORVASC ) 5 MG tablet    Has the patient contacted their pharmacy? Yes, advised for new script to be sent. (Agent: If no, request that the patient contact the pharmacy for the refill. If patient does not wish to contact the pharmacy document the reason why and proceed with request.) (Agent: If yes, when and what did the pharmacy advise?)  This is the patient's preferred pharmacy:  [facility] [place], [place] - [address] [facility] [address] [place] [address] Phone: [phone] Fax: [phone]  Is this the correct pharmacy for this prescription? Yes If no, delete pharmacy and type the correct one.   Has the prescription been filled recently? No  Is the patient out of the medication? Yes  Has the patient been seen for an appointment in the last year OR does the patient have an upcoming appointment? Yes  Can we respond through MyChart? Yes  Agent: Please be advised that Rx refills may take up to 3 business days. We ask that you follow-up with your pharmacy.

## 2024-08-07 ENCOUNTER — Encounter: Payer: Self-pay | Admitting: Radiology

## 2024-08-07 ENCOUNTER — Ambulatory Visit
Admission: RE | Admit: 2024-08-07 | Discharge: 2024-08-07 | Disposition: A | Source: Ambulatory Visit | Attending: Radiology | Admitting: Radiology

## 2024-08-07 VITALS — BP 146/71 | HR 83 | Temp 97.8°F | Resp 18 | Ht 63.0 in | Wt 155.8 lb

## 2024-08-07 DIAGNOSIS — C3412 Malignant neoplasm of upper lobe, left bronchus or lung: Secondary | ICD-10-CM

## 2024-08-07 MED ORDER — VALSARTAN-HYDROCHLOROTHIAZIDE 80-12.5 MG PO TABS: 1.0000 | ORAL_TABLET | Freq: Every day | ORAL | 0 refills | Status: AC

## 2024-08-07 MED ORDER — AMLODIPINE BESYLATE 5 MG PO TABS: 5.0000 mg | ORAL_TABLET | Freq: Every day | ORAL | 1 refills | Status: AC

## 2024-08-07 MED ORDER — LORAZEPAM 0.5 MG PO TABS: 0.5000 mg | ORAL_TABLET | Freq: Two times a day (BID) | ORAL | 0 refills | Status: DC | PRN

## 2024-08-07 NOTE — Progress Notes (Signed)
 Natalie Smith is here today for follow up post radiation to the lung. He completed radiation treatment on 2024-04-27.  Lung Side: Left  Does the patient complain of any of the following: Pain: Denies Shortness of breath w/wo exertion: Yes, she has COPD Cough: Denies Hemoptysis: Denies Pain with swallowing: Denies Swallowing/choking concerns: Denies Appetite: Very good Energy Level: Poor, she reports for not having the energy that she use to have. Post radiation skin Changes: Denies    Additional comments if applicable: Just awaiting the results from the last CT scan.  BP (!) 146/71 (BP Location: Right Arm, Patient Position: Sitting, Cuff Size: Large)   Pulse 83   Temp 97.8 F (36.6 C)   Resp 18   Ht 5' 3 (1.6 m)   Wt 155 lb 12.8 oz (70.7 kg)   SpO2 97%   BMI 27.60 kg/m

## 2024-09-03 ENCOUNTER — Other Ambulatory Visit: Payer: Self-pay | Admitting: Internal Medicine

## 2024-09-03 DIAGNOSIS — F419 Anxiety disorder, unspecified: Secondary | ICD-10-CM

## 2024-09-10 ENCOUNTER — Ambulatory Visit: Admitting: Internal Medicine

## 2024-09-12 ENCOUNTER — Telehealth: Payer: Self-pay | Admitting: Internal Medicine

## 2024-09-12 DIAGNOSIS — J449 Chronic obstructive pulmonary disease, unspecified: Secondary | ICD-10-CM

## 2024-09-12 NOTE — Telephone Encounter (Signed)
 Received letter form [employer]- Bevespi  no longer covered in 2026.   They prefer anoro or stiolto   Please advise if either is acceptable   Thanks

## 2024-09-12 NOTE — Telephone Encounter (Signed)
 Darlean Ozell NOVAK, MD to Me     09/12/24 11:35 AM Try anoro one click each am 1st   I tried calling the pt and there was no answer- LMTCB.

## 2024-09-13 MED ORDER — UMECLIDINIUM-VILANTEROL 62.5-25 MCG/ACT IN AEPB: 1.0000 | INHALATION_SPRAY | Freq: Every day | RESPIRATORY_TRACT | 1 refills | Status: DC

## 2024-09-13 NOTE — Telephone Encounter (Signed)
 Copied from CRM [id number]. Topic: Clinical - Medical Advice >> Sep 13, 2024 11:27 AM Joesph PARAS wrote: Reason for CRM: Patient is returning call, requesting call back. Patient has taken Breztri  but has not taken Bevespi . Patient would like clarification.   ATC x2. LMTCB

## 2024-09-13 NOTE — Telephone Encounter (Signed)
 Copied from CRM [id number]. Topic: Clinical - Medical Advice >> Sep 13, 2024  3:35 PM Dedra B wrote: Patient returning call for [facility] regarding Bevespi . Patient said to leave detailed message if she doesn't answer.   ATC x3. LM to check Mychart account Message sent

## 2024-09-13 NOTE — Telephone Encounter (Signed)
ATC x 1 

## 2024-09-14 ENCOUNTER — Telehealth: Payer: Self-pay

## 2024-09-14 ENCOUNTER — Other Ambulatory Visit (HOSPITAL_COMMUNITY): Payer: Self-pay

## 2024-09-14 MED ORDER — BREZTRI AEROSPHERE 160-9-4.8 MCG/ACT IN AERO: 2.0000 | INHALATION_SPRAY | Freq: Two times a day (BID) | RESPIRATORY_TRACT | 6 refills | Status: AC

## 2024-09-14 NOTE — Addendum Note (Signed)
 Addended byBETHA FRIES, Gardiner Espana A on: 09/14/2024 12:17 PM   Modules accepted: Orders

## 2024-09-14 NOTE — Telephone Encounter (Signed)
 I have cancelled Anoro inhaler.   Dr. Darlean pt would like to stay on Breztri  and I have refilled this prescription.   Just an FYI to see if this is appropriate.

## 2024-09-14 NOTE — Telephone Encounter (Signed)
 Copied from CRM #[id number]. Topic: Clinical - Medication Question >> Sep 12, 2024  1:32 PM Dedra B wrote: Reason for CRM: Patient returning call regarding her inhaler. Please call patient.   Spoke with patient old message  -NFN

## 2024-09-14 NOTE — Telephone Encounter (Signed)
 Pt states she has been taking Breztri  and not Bevespi .   Pharmacy, is Breztri  covered by insurance?

## 2024-09-14 NOTE — Telephone Encounter (Signed)
 Copied from CRM [id number]. Topic: Clinical - Medical Advice >> Sep 13, 2024  4:56 PM Dedra B wrote: Patient returning call. Relayed message that Anoro Ellipta  was sent to pharmacy. Patient said she was on Breztri  not Bevspi and wants to know if Breztri  is still covered by insurance.

## 2024-10-04 ENCOUNTER — Other Ambulatory Visit: Payer: Self-pay | Admitting: Internal Medicine

## 2024-10-04 DIAGNOSIS — F419 Anxiety disorder, unspecified: Secondary | ICD-10-CM

## 2024-10-10 ENCOUNTER — Telehealth: Payer: Self-pay | Admitting: *Deleted

## 2024-10-10 NOTE — Telephone Encounter (Signed)
 CALLED PATIENT TO INFORM OF CT FOR 02/05/25- ARRIVAL TIME- 1:30 PM  @ [facility], NO RESTRICTIONS TO SCAN, PATIENT  TO RECEIVE RESULTS ON 02-12-25 @ 1 PM WITH PA ELLIE MUIR, LVM FOR A RETURN CALL

## 2024-10-17 ENCOUNTER — Ambulatory Visit: Admitting: Internal Medicine

## 2025-02-05 ENCOUNTER — Ambulatory Visit (HOSPITAL_COMMUNITY)

## 2025-02-12 ENCOUNTER — Ambulatory Visit: Admitting: Radiology
